# Patient Record
Sex: Female | Born: 1955
Health system: Southern US, Community
[De-identification: ages and names within clinical notes are randomized; demographics above are authoritative.]

## PROBLEM LIST (undated history)

## (undated) DIAGNOSIS — K219 Gastro-esophageal reflux disease without esophagitis: Secondary | ICD-10-CM

## (undated) DIAGNOSIS — G8929 Other chronic pain: Secondary | ICD-10-CM

## (undated) DIAGNOSIS — F329 Major depressive disorder, single episode, unspecified: Secondary | ICD-10-CM

## (undated) DIAGNOSIS — F419 Anxiety disorder, unspecified: Secondary | ICD-10-CM

## (undated) DIAGNOSIS — E782 Mixed hyperlipidemia: Secondary | ICD-10-CM

## (undated) DIAGNOSIS — G4709 Other insomnia: Secondary | ICD-10-CM

## (undated) DIAGNOSIS — R011 Cardiac murmur, unspecified: Secondary | ICD-10-CM

## (undated) DIAGNOSIS — F5101 Primary insomnia: Secondary | ICD-10-CM

## (undated) DIAGNOSIS — D509 Iron deficiency anemia, unspecified: Secondary | ICD-10-CM

## (undated) DIAGNOSIS — E039 Hypothyroidism, unspecified: Secondary | ICD-10-CM

## (undated) DIAGNOSIS — I509 Heart failure, unspecified: Secondary | ICD-10-CM

## (undated) DIAGNOSIS — T300 Burn of unspecified body region, unspecified degree: Secondary | ICD-10-CM

## (undated) DIAGNOSIS — F32A Depression, unspecified: Secondary | ICD-10-CM

## (undated) DIAGNOSIS — I5022 Chronic systolic (congestive) heart failure: Secondary | ICD-10-CM

## (undated) DIAGNOSIS — I10 Essential (primary) hypertension: Secondary | ICD-10-CM

## (undated) HISTORY — DX: Anxiety disorder, unspecified: F41.9

## (undated) HISTORY — DX: Other chronic pain: G89.29

## (undated) HISTORY — DX: Gastro-esophageal reflux disease without esophagitis: K21.9

## (undated) HISTORY — DX: Cardiac murmur, unspecified: R01.1

## (undated) HISTORY — PX: CARPAL TUNNEL RELEASE: SHX101

## (undated) HISTORY — PX: SKIN GRAFT: SHX250

## (undated) HISTORY — DX: Other insomnia: G47.09

## (undated) HISTORY — DX: Primary insomnia: F51.01

## (undated) HISTORY — DX: Depression, unspecified: F32.A

## (undated) HISTORY — PX: BACK SURGERY: SHX140

## (undated) HISTORY — DX: Iron deficiency anemia, unspecified: D50.9

## (undated) HISTORY — DX: Mixed hyperlipidemia: E78.2

## (undated) HISTORY — DX: Burn of unspecified body region, unspecified degree: T30.0

## (undated) HISTORY — DX: Chronic systolic (congestive) heart failure: I50.22

## (undated) HISTORY — DX: Essential (primary) hypertension: I10

---
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
## 1997-08-08 ENCOUNTER — Inpatient Hospital Stay (HOSPITAL_COMMUNITY): Admission: RE | Admit: 1997-08-08 | Discharge: 1997-08-08 | Payer: Self-pay | Admitting: Family Medicine

## 2001-09-01 ENCOUNTER — Encounter: Payer: Self-pay | Admitting: Physical Medicine and Rehabilitation

## 2001-09-01 ENCOUNTER — Encounter
Admission: RE | Admit: 2001-09-01 | Discharge: 2001-09-01 | Payer: Self-pay | Admitting: Physical Medicine and Rehabilitation

## 2001-11-17 ENCOUNTER — Emergency Department (HOSPITAL_COMMUNITY): Admission: EM | Admit: 2001-11-17 | Discharge: 2001-11-17 | Payer: Self-pay | Admitting: Emergency Medicine

## 2002-04-14 ENCOUNTER — Encounter: Payer: Self-pay | Admitting: Specialist

## 2002-04-18 ENCOUNTER — Encounter: Payer: Self-pay | Admitting: Specialist

## 2002-04-19 ENCOUNTER — Inpatient Hospital Stay (HOSPITAL_COMMUNITY): Admission: RE | Admit: 2002-04-19 | Discharge: 2002-04-20 | Payer: Self-pay | Admitting: Specialist

## 2003-03-07 ENCOUNTER — Encounter: Admission: RE | Admit: 2003-03-07 | Discharge: 2003-03-07 | Payer: Self-pay | Admitting: Specialist

## 2003-04-19 ENCOUNTER — Ambulatory Visit (HOSPITAL_COMMUNITY): Admission: RE | Admit: 2003-04-19 | Discharge: 2003-04-19 | Payer: Self-pay | Admitting: Obstetrics and Gynecology

## 2005-05-08 ENCOUNTER — Ambulatory Visit: Payer: Self-pay | Admitting: *Deleted

## 2005-05-15 ENCOUNTER — Ambulatory Visit: Payer: Self-pay

## 2005-06-25 ENCOUNTER — Ambulatory Visit: Payer: Self-pay

## 2008-01-14 HISTORY — PX: OTHER SURGICAL HISTORY: SHX169

## 2010-05-30 ENCOUNTER — Other Ambulatory Visit: Payer: Self-pay | Admitting: Obstetrics and Gynecology

## 2010-05-30 DIAGNOSIS — Z1231 Encounter for screening mammogram for malignant neoplasm of breast: Secondary | ICD-10-CM

## 2010-06-06 ENCOUNTER — Ambulatory Visit
Admission: RE | Admit: 2010-06-06 | Discharge: 2010-06-06 | Disposition: A | Discharge status: Home / Self Care | Payer: Medicare Other | Source: Ambulatory Visit | Attending: Obstetrics and Gynecology | Admitting: Obstetrics and Gynecology

## 2010-06-06 DIAGNOSIS — Z1231 Encounter for screening mammogram for malignant neoplasm of breast: Secondary | ICD-10-CM

## 2011-06-23 ENCOUNTER — Other Ambulatory Visit: Payer: Self-pay | Admitting: Obstetrics and Gynecology

## 2011-06-23 DIAGNOSIS — Z1231 Encounter for screening mammogram for malignant neoplasm of breast: Secondary | ICD-10-CM

## 2011-07-11 ENCOUNTER — Ambulatory Visit
Admission: RE | Admit: 2011-07-11 | Discharge: 2011-07-11 | Disposition: A | Discharge status: Home / Self Care | Payer: Medicare Other | Source: Ambulatory Visit | Attending: Obstetrics and Gynecology | Admitting: Obstetrics and Gynecology

## 2011-07-11 DIAGNOSIS — Z1231 Encounter for screening mammogram for malignant neoplasm of breast: Secondary | ICD-10-CM

## 2012-08-04 ENCOUNTER — Other Ambulatory Visit: Payer: Self-pay

## 2012-08-04 DIAGNOSIS — Z1231 Encounter for screening mammogram for malignant neoplasm of breast: Secondary | ICD-10-CM

## 2012-09-24 ENCOUNTER — Ambulatory Visit: Payer: Medicare Other

## 2012-11-05 ENCOUNTER — Ambulatory Visit
Admission: RE | Admit: 2012-11-05 | Discharge: 2012-11-05 | Disposition: A | Discharge status: Home / Self Care | Payer: Medicare Other | Source: Ambulatory Visit

## 2012-11-05 DIAGNOSIS — Z1231 Encounter for screening mammogram for malignant neoplasm of breast: Secondary | ICD-10-CM

## 2014-01-17 DIAGNOSIS — R5381 Other malaise: Secondary | ICD-10-CM | POA: Diagnosis not present

## 2014-02-17 DIAGNOSIS — R5381 Other malaise: Secondary | ICD-10-CM | POA: Diagnosis not present

## 2014-04-25 DIAGNOSIS — R9431 Abnormal electrocardiogram [ECG] [EKG]: Secondary | ICD-10-CM | POA: Diagnosis not present

## 2014-04-25 DIAGNOSIS — I10 Essential (primary) hypertension: Secondary | ICD-10-CM | POA: Diagnosis not present

## 2014-04-25 DIAGNOSIS — R011 Cardiac murmur, unspecified: Secondary | ICD-10-CM | POA: Diagnosis not present

## 2014-05-10 DIAGNOSIS — R011 Cardiac murmur, unspecified: Secondary | ICD-10-CM | POA: Diagnosis not present

## 2014-06-06 DIAGNOSIS — I10 Essential (primary) hypertension: Secondary | ICD-10-CM | POA: Diagnosis not present

## 2014-06-06 DIAGNOSIS — M545 Low back pain: Secondary | ICD-10-CM | POA: Diagnosis not present

## 2014-06-06 DIAGNOSIS — R9431 Abnormal electrocardiogram [ECG] [EKG]: Secondary | ICD-10-CM | POA: Diagnosis not present

## 2014-06-06 DIAGNOSIS — I1310 Hypertensive heart and chronic kidney disease without heart failure, with stage 1 through stage 4 chronic kidney disease, or unspecified chronic kidney disease: Secondary | ICD-10-CM | POA: Diagnosis not present

## 2014-06-06 DIAGNOSIS — M25562 Pain in left knee: Secondary | ICD-10-CM | POA: Diagnosis not present

## 2014-06-06 DIAGNOSIS — R7301 Impaired fasting glucose: Secondary | ICD-10-CM | POA: Diagnosis not present

## 2014-06-15 DIAGNOSIS — I10 Essential (primary) hypertension: Secondary | ICD-10-CM | POA: Diagnosis not present

## 2014-06-15 DIAGNOSIS — R7301 Impaired fasting glucose: Secondary | ICD-10-CM | POA: Diagnosis not present

## 2014-06-15 DIAGNOSIS — E780 Pure hypercholesterolemia: Secondary | ICD-10-CM | POA: Diagnosis not present

## 2014-06-21 DIAGNOSIS — R9431 Abnormal electrocardiogram [ECG] [EKG]: Secondary | ICD-10-CM | POA: Diagnosis not present

## 2014-10-03 DIAGNOSIS — I10 Essential (primary) hypertension: Secondary | ICD-10-CM | POA: Diagnosis not present

## 2014-10-03 DIAGNOSIS — E782 Mixed hyperlipidemia: Secondary | ICD-10-CM | POA: Diagnosis not present

## 2014-10-03 DIAGNOSIS — J0100 Acute maxillary sinusitis, unspecified: Secondary | ICD-10-CM | POA: Diagnosis not present

## 2014-10-03 DIAGNOSIS — R358 Other polyuria: Secondary | ICD-10-CM | POA: Diagnosis not present

## 2014-10-03 DIAGNOSIS — M545 Low back pain: Secondary | ICD-10-CM | POA: Diagnosis not present

## 2014-10-03 DIAGNOSIS — M5416 Radiculopathy, lumbar region: Secondary | ICD-10-CM | POA: Diagnosis not present

## 2014-10-03 DIAGNOSIS — Z23 Encounter for immunization: Secondary | ICD-10-CM | POA: Diagnosis not present

## 2014-10-03 DIAGNOSIS — R7301 Impaired fasting glucose: Secondary | ICD-10-CM | POA: Diagnosis not present

## 2014-10-25 DIAGNOSIS — E668 Other obesity: Secondary | ICD-10-CM | POA: Diagnosis not present

## 2014-10-25 DIAGNOSIS — E7800 Pure hypercholesterolemia, unspecified: Secondary | ICD-10-CM | POA: Diagnosis not present

## 2014-10-25 DIAGNOSIS — I10 Essential (primary) hypertension: Secondary | ICD-10-CM | POA: Diagnosis not present

## 2014-10-25 DIAGNOSIS — Z23 Encounter for immunization: Secondary | ICD-10-CM | POA: Diagnosis not present

## 2014-10-25 DIAGNOSIS — Z0000 Encounter for general adult medical examination without abnormal findings: Secondary | ICD-10-CM | POA: Diagnosis not present

## 2014-10-25 DIAGNOSIS — F320 Major depressive disorder, single episode, mild: Secondary | ICD-10-CM | POA: Diagnosis not present

## 2014-10-25 DIAGNOSIS — R7301 Impaired fasting glucose: Secondary | ICD-10-CM | POA: Diagnosis not present

## 2014-10-25 DIAGNOSIS — Z1211 Encounter for screening for malignant neoplasm of colon: Secondary | ICD-10-CM | POA: Diagnosis not present

## 2014-10-25 DIAGNOSIS — Z6841 Body Mass Index (BMI) 40.0 and over, adult: Secondary | ICD-10-CM | POA: Diagnosis not present

## 2014-11-17 DIAGNOSIS — Z1211 Encounter for screening for malignant neoplasm of colon: Secondary | ICD-10-CM | POA: Diagnosis not present

## 2014-11-17 DIAGNOSIS — F320 Major depressive disorder, single episode, mild: Secondary | ICD-10-CM | POA: Diagnosis not present

## 2014-11-17 DIAGNOSIS — I10 Essential (primary) hypertension: Secondary | ICD-10-CM | POA: Diagnosis not present

## 2014-11-17 DIAGNOSIS — E782 Mixed hyperlipidemia: Secondary | ICD-10-CM | POA: Diagnosis not present

## 2014-11-17 DIAGNOSIS — M545 Low back pain: Secondary | ICD-10-CM | POA: Diagnosis not present

## 2015-01-19 DIAGNOSIS — R7301 Impaired fasting glucose: Secondary | ICD-10-CM | POA: Diagnosis not present

## 2015-01-19 DIAGNOSIS — I10 Essential (primary) hypertension: Secondary | ICD-10-CM | POA: Diagnosis not present

## 2015-01-19 DIAGNOSIS — E782 Mixed hyperlipidemia: Secondary | ICD-10-CM | POA: Diagnosis not present

## 2015-01-23 DIAGNOSIS — G5602 Carpal tunnel syndrome, left upper limb: Secondary | ICD-10-CM | POA: Diagnosis not present

## 2015-01-23 DIAGNOSIS — M545 Low back pain: Secondary | ICD-10-CM | POA: Diagnosis not present

## 2015-01-23 DIAGNOSIS — M5416 Radiculopathy, lumbar region: Secondary | ICD-10-CM | POA: Diagnosis not present

## 2015-01-23 DIAGNOSIS — E782 Mixed hyperlipidemia: Secondary | ICD-10-CM | POA: Diagnosis not present

## 2015-01-23 DIAGNOSIS — Z6841 Body Mass Index (BMI) 40.0 and over, adult: Secondary | ICD-10-CM | POA: Diagnosis not present

## 2015-01-23 DIAGNOSIS — R7301 Impaired fasting glucose: Secondary | ICD-10-CM | POA: Diagnosis not present

## 2015-01-23 DIAGNOSIS — I10 Essential (primary) hypertension: Secondary | ICD-10-CM | POA: Diagnosis not present

## 2015-02-23 DIAGNOSIS — I10 Essential (primary) hypertension: Secondary | ICD-10-CM | POA: Diagnosis not present

## 2015-02-23 DIAGNOSIS — F320 Major depressive disorder, single episode, mild: Secondary | ICD-10-CM | POA: Diagnosis not present

## 2015-02-23 DIAGNOSIS — Z6841 Body Mass Index (BMI) 40.0 and over, adult: Secondary | ICD-10-CM | POA: Diagnosis not present

## 2015-02-23 DIAGNOSIS — R7301 Impaired fasting glucose: Secondary | ICD-10-CM | POA: Diagnosis not present

## 2015-03-13 DIAGNOSIS — K219 Gastro-esophageal reflux disease without esophagitis: Secondary | ICD-10-CM | POA: Diagnosis not present

## 2015-03-13 DIAGNOSIS — D509 Iron deficiency anemia, unspecified: Secondary | ICD-10-CM | POA: Diagnosis not present

## 2015-03-16 DIAGNOSIS — F5102 Adjustment insomnia: Secondary | ICD-10-CM | POA: Diagnosis not present

## 2015-03-16 DIAGNOSIS — F320 Major depressive disorder, single episode, mild: Secondary | ICD-10-CM | POA: Diagnosis not present

## 2015-03-16 DIAGNOSIS — M545 Low back pain: Secondary | ICD-10-CM | POA: Diagnosis not present

## 2015-03-16 DIAGNOSIS — R7301 Impaired fasting glucose: Secondary | ICD-10-CM | POA: Diagnosis not present

## 2015-03-16 DIAGNOSIS — D508 Other iron deficiency anemias: Secondary | ICD-10-CM | POA: Diagnosis not present

## 2015-03-16 DIAGNOSIS — Z6841 Body Mass Index (BMI) 40.0 and over, adult: Secondary | ICD-10-CM | POA: Diagnosis not present

## 2015-03-30 DIAGNOSIS — D509 Iron deficiency anemia, unspecified: Secondary | ICD-10-CM | POA: Diagnosis not present

## 2015-03-30 DIAGNOSIS — K641 Second degree hemorrhoids: Secondary | ICD-10-CM | POA: Diagnosis not present

## 2015-03-30 DIAGNOSIS — K219 Gastro-esophageal reflux disease without esophagitis: Secondary | ICD-10-CM | POA: Diagnosis not present

## 2015-03-30 DIAGNOSIS — D649 Anemia, unspecified: Secondary | ICD-10-CM | POA: Diagnosis not present

## 2015-03-30 DIAGNOSIS — K5730 Diverticulosis of large intestine without perforation or abscess without bleeding: Secondary | ICD-10-CM | POA: Diagnosis not present

## 2015-03-30 DIAGNOSIS — Z1211 Encounter for screening for malignant neoplasm of colon: Secondary | ICD-10-CM | POA: Diagnosis not present

## 2015-03-30 DIAGNOSIS — K2970 Gastritis, unspecified, without bleeding: Secondary | ICD-10-CM | POA: Diagnosis not present

## 2015-03-30 DIAGNOSIS — K449 Diaphragmatic hernia without obstruction or gangrene: Secondary | ICD-10-CM | POA: Diagnosis not present

## 2015-03-30 DIAGNOSIS — K2900 Acute gastritis without bleeding: Secondary | ICD-10-CM | POA: Diagnosis not present

## 2015-03-30 HISTORY — PX: COLONOSCOPY: SHX174

## 2015-03-30 HISTORY — PX: ESOPHAGOGASTRODUODENOSCOPY: SHX1529

## 2015-04-20 DIAGNOSIS — M961 Postlaminectomy syndrome, not elsewhere classified: Secondary | ICD-10-CM | POA: Diagnosis not present

## 2015-04-20 DIAGNOSIS — M545 Low back pain: Secondary | ICD-10-CM | POA: Diagnosis not present

## 2015-04-20 DIAGNOSIS — M5431 Sciatica, right side: Secondary | ICD-10-CM | POA: Diagnosis not present

## 2015-04-20 DIAGNOSIS — M5106 Intervertebral disc disorders with myelopathy, lumbar region: Secondary | ICD-10-CM | POA: Diagnosis not present

## 2015-05-04 DIAGNOSIS — Z6841 Body Mass Index (BMI) 40.0 and over, adult: Secondary | ICD-10-CM | POA: Diagnosis not present

## 2015-05-04 DIAGNOSIS — E782 Mixed hyperlipidemia: Secondary | ICD-10-CM | POA: Diagnosis not present

## 2015-05-04 DIAGNOSIS — I10 Essential (primary) hypertension: Secondary | ICD-10-CM | POA: Diagnosis not present

## 2015-05-04 DIAGNOSIS — R7301 Impaired fasting glucose: Secondary | ICD-10-CM | POA: Diagnosis not present

## 2015-05-04 DIAGNOSIS — M5431 Sciatica, right side: Secondary | ICD-10-CM | POA: Diagnosis not present

## 2015-05-04 DIAGNOSIS — G5602 Carpal tunnel syndrome, left upper limb: Secondary | ICD-10-CM | POA: Diagnosis not present

## 2015-05-04 DIAGNOSIS — M5416 Radiculopathy, lumbar region: Secondary | ICD-10-CM | POA: Diagnosis not present

## 2015-05-04 DIAGNOSIS — M545 Low back pain: Secondary | ICD-10-CM | POA: Diagnosis not present

## 2015-08-08 DIAGNOSIS — M545 Low back pain: Secondary | ICD-10-CM | POA: Diagnosis not present

## 2015-08-08 DIAGNOSIS — E782 Mixed hyperlipidemia: Secondary | ICD-10-CM | POA: Diagnosis not present

## 2015-08-08 DIAGNOSIS — F321 Major depressive disorder, single episode, moderate: Secondary | ICD-10-CM | POA: Diagnosis not present

## 2015-08-08 DIAGNOSIS — I10 Essential (primary) hypertension: Secondary | ICD-10-CM | POA: Diagnosis not present

## 2015-08-08 DIAGNOSIS — R7301 Impaired fasting glucose: Secondary | ICD-10-CM | POA: Diagnosis not present

## 2015-08-08 DIAGNOSIS — F5102 Adjustment insomnia: Secondary | ICD-10-CM | POA: Diagnosis not present

## 2015-08-08 DIAGNOSIS — Z6841 Body Mass Index (BMI) 40.0 and over, adult: Secondary | ICD-10-CM | POA: Diagnosis not present

## 2015-08-08 DIAGNOSIS — M5416 Radiculopathy, lumbar region: Secondary | ICD-10-CM | POA: Diagnosis not present

## 2015-10-19 DIAGNOSIS — Z01419 Encounter for gynecological examination (general) (routine) without abnormal findings: Secondary | ICD-10-CM | POA: Diagnosis not present

## 2015-10-19 DIAGNOSIS — Z1231 Encounter for screening mammogram for malignant neoplasm of breast: Secondary | ICD-10-CM | POA: Diagnosis not present

## 2015-10-22 ENCOUNTER — Other Ambulatory Visit: Payer: Self-pay | Admitting: Obstetrics and Gynecology

## 2015-10-22 DIAGNOSIS — R928 Other abnormal and inconclusive findings on diagnostic imaging of breast: Secondary | ICD-10-CM

## 2015-10-31 ENCOUNTER — Other Ambulatory Visit: Payer: Medicare Other

## 2015-11-14 DIAGNOSIS — F5102 Adjustment insomnia: Secondary | ICD-10-CM | POA: Diagnosis not present

## 2015-11-14 DIAGNOSIS — F321 Major depressive disorder, single episode, moderate: Secondary | ICD-10-CM | POA: Diagnosis not present

## 2015-11-14 DIAGNOSIS — M5416 Radiculopathy, lumbar region: Secondary | ICD-10-CM | POA: Diagnosis not present

## 2015-11-14 DIAGNOSIS — I10 Essential (primary) hypertension: Secondary | ICD-10-CM | POA: Diagnosis not present

## 2015-11-14 DIAGNOSIS — R7301 Impaired fasting glucose: Secondary | ICD-10-CM | POA: Diagnosis not present

## 2015-11-14 DIAGNOSIS — Z6841 Body Mass Index (BMI) 40.0 and over, adult: Secondary | ICD-10-CM | POA: Diagnosis not present

## 2015-11-14 DIAGNOSIS — M545 Low back pain: Secondary | ICD-10-CM | POA: Diagnosis not present

## 2015-11-14 DIAGNOSIS — E782 Mixed hyperlipidemia: Secondary | ICD-10-CM | POA: Diagnosis not present

## 2015-11-26 ENCOUNTER — Ambulatory Visit
Admission: RE | Admit: 2015-11-26 | Discharge: 2015-11-26 | Disposition: A | Discharge status: Home / Self Care | Payer: Commercial Managed Care - HMO | Source: Ambulatory Visit | Attending: Obstetrics and Gynecology | Admitting: Obstetrics and Gynecology

## 2015-11-26 DIAGNOSIS — R928 Other abnormal and inconclusive findings on diagnostic imaging of breast: Secondary | ICD-10-CM

## 2015-11-28 ENCOUNTER — Ambulatory Visit
Admission: RE | Admit: 2015-11-28 | Discharge: 2015-11-28 | Disposition: A | Discharge status: Home / Self Care | Payer: Commercial Managed Care - HMO | Source: Ambulatory Visit | Attending: Obstetrics and Gynecology | Admitting: Obstetrics and Gynecology

## 2015-11-28 DIAGNOSIS — R928 Other abnormal and inconclusive findings on diagnostic imaging of breast: Secondary | ICD-10-CM | POA: Diagnosis not present

## 2015-11-28 DIAGNOSIS — N6489 Other specified disorders of breast: Secondary | ICD-10-CM | POA: Diagnosis not present

## 2016-03-13 DIAGNOSIS — R7301 Impaired fasting glucose: Secondary | ICD-10-CM | POA: Diagnosis not present

## 2016-03-13 DIAGNOSIS — Z6841 Body Mass Index (BMI) 40.0 and over, adult: Secondary | ICD-10-CM | POA: Diagnosis not present

## 2016-03-13 DIAGNOSIS — E782 Mixed hyperlipidemia: Secondary | ICD-10-CM | POA: Diagnosis not present

## 2016-03-13 DIAGNOSIS — M5416 Radiculopathy, lumbar region: Secondary | ICD-10-CM | POA: Diagnosis not present

## 2016-03-13 DIAGNOSIS — I10 Essential (primary) hypertension: Secondary | ICD-10-CM | POA: Diagnosis not present

## 2016-03-13 DIAGNOSIS — R590 Localized enlarged lymph nodes: Secondary | ICD-10-CM | POA: Diagnosis not present

## 2016-03-13 DIAGNOSIS — G5602 Carpal tunnel syndrome, left upper limb: Secondary | ICD-10-CM | POA: Diagnosis not present

## 2016-03-13 DIAGNOSIS — M545 Low back pain: Secondary | ICD-10-CM | POA: Diagnosis not present

## 2016-03-20 DIAGNOSIS — R590 Localized enlarged lymph nodes: Secondary | ICD-10-CM | POA: Diagnosis not present

## 2016-03-20 DIAGNOSIS — R609 Edema, unspecified: Secondary | ICD-10-CM | POA: Diagnosis not present

## 2016-05-05 DIAGNOSIS — J018 Other acute sinusitis: Secondary | ICD-10-CM | POA: Diagnosis not present

## 2016-05-08 DIAGNOSIS — M67432 Ganglion, left wrist: Secondary | ICD-10-CM | POA: Diagnosis not present

## 2016-05-08 DIAGNOSIS — M79642 Pain in left hand: Secondary | ICD-10-CM | POA: Diagnosis not present

## 2016-05-08 DIAGNOSIS — G5602 Carpal tunnel syndrome, left upper limb: Secondary | ICD-10-CM | POA: Diagnosis not present

## 2016-05-20 DIAGNOSIS — D492 Neoplasm of unspecified behavior of bone, soft tissue, and skin: Secondary | ICD-10-CM | POA: Diagnosis not present

## 2016-05-20 DIAGNOSIS — M67432 Ganglion, left wrist: Secondary | ICD-10-CM | POA: Diagnosis not present

## 2016-05-20 DIAGNOSIS — G5602 Carpal tunnel syndrome, left upper limb: Secondary | ICD-10-CM | POA: Diagnosis not present

## 2016-05-20 DIAGNOSIS — G8918 Other acute postprocedural pain: Secondary | ICD-10-CM | POA: Diagnosis not present

## 2016-05-20 DIAGNOSIS — M67832 Other specified disorders of synovium, left wrist: Secondary | ICD-10-CM | POA: Diagnosis not present

## 2016-05-20 DIAGNOSIS — M79632 Pain in left forearm: Secondary | ICD-10-CM | POA: Diagnosis not present

## 2016-06-02 DIAGNOSIS — M67432 Ganglion, left wrist: Secondary | ICD-10-CM | POA: Diagnosis not present

## 2016-06-02 DIAGNOSIS — G5602 Carpal tunnel syndrome, left upper limb: Secondary | ICD-10-CM | POA: Diagnosis not present

## 2016-06-12 DIAGNOSIS — Z6841 Body Mass Index (BMI) 40.0 and over, adult: Secondary | ICD-10-CM | POA: Diagnosis not present

## 2016-06-12 DIAGNOSIS — R7301 Impaired fasting glucose: Secondary | ICD-10-CM | POA: Diagnosis not present

## 2016-06-12 DIAGNOSIS — I10 Essential (primary) hypertension: Secondary | ICD-10-CM | POA: Diagnosis not present

## 2016-06-12 DIAGNOSIS — E782 Mixed hyperlipidemia: Secondary | ICD-10-CM | POA: Diagnosis not present

## 2016-06-12 DIAGNOSIS — F320 Major depressive disorder, single episode, mild: Secondary | ICD-10-CM | POA: Diagnosis not present

## 2016-06-12 DIAGNOSIS — E663 Overweight: Secondary | ICD-10-CM | POA: Diagnosis not present

## 2016-06-12 DIAGNOSIS — J302 Other seasonal allergic rhinitis: Secondary | ICD-10-CM | POA: Diagnosis not present

## 2016-06-16 DIAGNOSIS — G5602 Carpal tunnel syndrome, left upper limb: Secondary | ICD-10-CM | POA: Diagnosis not present

## 2016-06-18 DIAGNOSIS — R7301 Impaired fasting glucose: Secondary | ICD-10-CM | POA: Diagnosis not present

## 2016-06-18 DIAGNOSIS — E782 Mixed hyperlipidemia: Secondary | ICD-10-CM | POA: Diagnosis not present

## 2016-06-18 DIAGNOSIS — I10 Essential (primary) hypertension: Secondary | ICD-10-CM | POA: Diagnosis not present

## 2016-06-30 DIAGNOSIS — G5602 Carpal tunnel syndrome, left upper limb: Secondary | ICD-10-CM | POA: Diagnosis not present

## 2016-07-10 DIAGNOSIS — M6281 Muscle weakness (generalized): Secondary | ICD-10-CM | POA: Diagnosis not present

## 2016-07-10 DIAGNOSIS — M25532 Pain in left wrist: Secondary | ICD-10-CM | POA: Diagnosis not present

## 2016-07-10 DIAGNOSIS — M25642 Stiffness of left hand, not elsewhere classified: Secondary | ICD-10-CM | POA: Diagnosis not present

## 2016-07-10 DIAGNOSIS — M25442 Effusion, left hand: Secondary | ICD-10-CM | POA: Diagnosis not present

## 2016-07-17 DIAGNOSIS — M6281 Muscle weakness (generalized): Secondary | ICD-10-CM | POA: Diagnosis not present

## 2016-07-17 DIAGNOSIS — M25442 Effusion, left hand: Secondary | ICD-10-CM | POA: Diagnosis not present

## 2016-07-17 DIAGNOSIS — M25642 Stiffness of left hand, not elsewhere classified: Secondary | ICD-10-CM | POA: Diagnosis not present

## 2016-07-17 DIAGNOSIS — M25532 Pain in left wrist: Secondary | ICD-10-CM | POA: Diagnosis not present

## 2016-08-19 ENCOUNTER — Other Ambulatory Visit: Payer: Self-pay | Admitting: Pharmacy Technician

## 2016-08-19 NOTE — Patient Outreach (Signed)
[facility]) Care Management  08/19/2016  DAKISHA SCHOOF 01-18-1955 [id number]   Contacted patient in regards to Metformin medication adherence. No answer, left [employer] appropriate voicemail to return my call.  Maud Deed [place], [place] Management [phone]

## 2016-08-27 ENCOUNTER — Other Ambulatory Visit: Payer: Self-pay | Admitting: Pharmacy Technician

## 2016-08-27 NOTE — Patient Outreach (Signed)
[facility]) Care Management  08/27/2016  Donna Mayer 30-Apr-1955 [id number]   Contacted patient in regards to Metformin medication adherence. Patient states she is currently taking 1 tablet daily without issues. Patient receives other meds thru [employer]. I offered to contact Braswell Cox's office to have a 3 month script sent into [facility] mail order. Patient consented that I do so.   Contacted Kristen Cox's office and left voicemail on nurse [employer] requesting script be sent into [facility] mail order pharmacy and to please call me back to confirm.  Maud Deed [place], [place] Management [phone]

## 2016-09-17 DIAGNOSIS — Z6841 Body Mass Index (BMI) 40.0 and over, adult: Secondary | ICD-10-CM | POA: Diagnosis not present

## 2016-09-17 DIAGNOSIS — E663 Overweight: Secondary | ICD-10-CM | POA: Diagnosis not present

## 2016-09-17 DIAGNOSIS — E782 Mixed hyperlipidemia: Secondary | ICD-10-CM | POA: Diagnosis not present

## 2016-09-17 DIAGNOSIS — D6489 Other specified anemias: Secondary | ICD-10-CM | POA: Diagnosis not present

## 2016-09-17 DIAGNOSIS — F320 Major depressive disorder, single episode, mild: Secondary | ICD-10-CM | POA: Diagnosis not present

## 2016-09-17 DIAGNOSIS — J302 Other seasonal allergic rhinitis: Secondary | ICD-10-CM | POA: Diagnosis not present

## 2016-09-17 DIAGNOSIS — I10 Essential (primary) hypertension: Secondary | ICD-10-CM | POA: Diagnosis not present

## 2016-09-17 DIAGNOSIS — R05 Cough: Secondary | ICD-10-CM | POA: Diagnosis not present

## 2016-09-17 DIAGNOSIS — D508 Other iron deficiency anemias: Secondary | ICD-10-CM | POA: Diagnosis not present

## 2016-09-17 DIAGNOSIS — R7301 Impaired fasting glucose: Secondary | ICD-10-CM | POA: Diagnosis not present

## 2016-10-17 DIAGNOSIS — J302 Other seasonal allergic rhinitis: Secondary | ICD-10-CM | POA: Diagnosis not present

## 2016-10-17 DIAGNOSIS — Z23 Encounter for immunization: Secondary | ICD-10-CM | POA: Diagnosis not present

## 2016-10-17 DIAGNOSIS — E663 Overweight: Secondary | ICD-10-CM | POA: Diagnosis not present

## 2016-10-17 DIAGNOSIS — D508 Other iron deficiency anemias: Secondary | ICD-10-CM | POA: Diagnosis not present

## 2016-12-25 DIAGNOSIS — I10 Essential (primary) hypertension: Secondary | ICD-10-CM | POA: Diagnosis not present

## 2016-12-25 DIAGNOSIS — R7301 Impaired fasting glucose: Secondary | ICD-10-CM | POA: Diagnosis not present

## 2016-12-25 DIAGNOSIS — Z6841 Body Mass Index (BMI) 40.0 and over, adult: Secondary | ICD-10-CM | POA: Diagnosis not present

## 2016-12-25 DIAGNOSIS — F320 Major depressive disorder, single episode, mild: Secondary | ICD-10-CM | POA: Diagnosis not present

## 2016-12-25 DIAGNOSIS — E782 Mixed hyperlipidemia: Secondary | ICD-10-CM | POA: Diagnosis not present

## 2016-12-25 DIAGNOSIS — E663 Overweight: Secondary | ICD-10-CM | POA: Diagnosis not present

## 2017-02-04 DIAGNOSIS — R358 Other polyuria: Secondary | ICD-10-CM | POA: Diagnosis not present

## 2017-02-04 DIAGNOSIS — J018 Other acute sinusitis: Secondary | ICD-10-CM | POA: Diagnosis not present

## 2017-02-04 DIAGNOSIS — H65191 Other acute nonsuppurative otitis media, right ear: Secondary | ICD-10-CM | POA: Diagnosis not present

## 2017-02-04 DIAGNOSIS — Z6841 Body Mass Index (BMI) 40.0 and over, adult: Secondary | ICD-10-CM | POA: Diagnosis not present

## 2017-03-31 DIAGNOSIS — E782 Mixed hyperlipidemia: Secondary | ICD-10-CM | POA: Diagnosis not present

## 2017-03-31 DIAGNOSIS — F321 Major depressive disorder, single episode, moderate: Secondary | ICD-10-CM | POA: Diagnosis not present

## 2017-03-31 DIAGNOSIS — Z6841 Body Mass Index (BMI) 40.0 and over, adult: Secondary | ICD-10-CM | POA: Diagnosis not present

## 2017-03-31 DIAGNOSIS — E663 Overweight: Secondary | ICD-10-CM | POA: Diagnosis not present

## 2017-03-31 DIAGNOSIS — I10 Essential (primary) hypertension: Secondary | ICD-10-CM | POA: Diagnosis not present

## 2017-03-31 DIAGNOSIS — R7301 Impaired fasting glucose: Secondary | ICD-10-CM | POA: Diagnosis not present

## 2017-04-13 DIAGNOSIS — H10219 Acute toxic conjunctivitis, unspecified eye: Secondary | ICD-10-CM | POA: Diagnosis not present

## 2017-04-13 DIAGNOSIS — H10213 Acute toxic conjunctivitis, bilateral: Secondary | ICD-10-CM | POA: Diagnosis not present

## 2017-05-11 DIAGNOSIS — I10 Essential (primary) hypertension: Secondary | ICD-10-CM | POA: Diagnosis not present

## 2017-05-11 DIAGNOSIS — Z6841 Body Mass Index (BMI) 40.0 and over, adult: Secondary | ICD-10-CM | POA: Diagnosis not present

## 2017-05-11 DIAGNOSIS — F321 Major depressive disorder, single episode, moderate: Secondary | ICD-10-CM | POA: Diagnosis not present

## 2017-05-11 DIAGNOSIS — E663 Overweight: Secondary | ICD-10-CM | POA: Diagnosis not present

## 2017-08-31 DIAGNOSIS — F321 Major depressive disorder, single episode, moderate: Secondary | ICD-10-CM | POA: Diagnosis not present

## 2017-08-31 DIAGNOSIS — I10 Essential (primary) hypertension: Secondary | ICD-10-CM | POA: Diagnosis not present

## 2017-08-31 DIAGNOSIS — K219 Gastro-esophageal reflux disease without esophagitis: Secondary | ICD-10-CM | POA: Diagnosis not present

## 2017-08-31 DIAGNOSIS — Z6841 Body Mass Index (BMI) 40.0 and over, adult: Secondary | ICD-10-CM | POA: Diagnosis not present

## 2017-08-31 DIAGNOSIS — M545 Low back pain: Secondary | ICD-10-CM | POA: Diagnosis not present

## 2017-08-31 DIAGNOSIS — R7301 Impaired fasting glucose: Secondary | ICD-10-CM | POA: Diagnosis not present

## 2017-08-31 DIAGNOSIS — E782 Mixed hyperlipidemia: Secondary | ICD-10-CM | POA: Diagnosis not present

## 2017-08-31 DIAGNOSIS — J018 Other acute sinusitis: Secondary | ICD-10-CM | POA: Diagnosis not present

## 2017-10-05 DIAGNOSIS — Z23 Encounter for immunization: Secondary | ICD-10-CM | POA: Diagnosis not present

## 2017-10-05 DIAGNOSIS — I10 Essential (primary) hypertension: Secondary | ICD-10-CM | POA: Diagnosis not present

## 2017-10-05 DIAGNOSIS — Z6841 Body Mass Index (BMI) 40.0 and over, adult: Secondary | ICD-10-CM | POA: Diagnosis not present

## 2017-12-03 DIAGNOSIS — M545 Low back pain: Secondary | ICD-10-CM | POA: Diagnosis not present

## 2017-12-03 DIAGNOSIS — Z6841 Body Mass Index (BMI) 40.0 and over, adult: Secondary | ICD-10-CM | POA: Diagnosis not present

## 2017-12-03 DIAGNOSIS — F321 Major depressive disorder, single episode, moderate: Secondary | ICD-10-CM | POA: Diagnosis not present

## 2017-12-03 DIAGNOSIS — D508 Other iron deficiency anemias: Secondary | ICD-10-CM | POA: Diagnosis not present

## 2017-12-03 DIAGNOSIS — I10 Essential (primary) hypertension: Secondary | ICD-10-CM | POA: Diagnosis not present

## 2017-12-03 DIAGNOSIS — E782 Mixed hyperlipidemia: Secondary | ICD-10-CM | POA: Diagnosis not present

## 2017-12-03 DIAGNOSIS — R7301 Impaired fasting glucose: Secondary | ICD-10-CM | POA: Diagnosis not present

## 2017-12-03 DIAGNOSIS — K219 Gastro-esophageal reflux disease without esophagitis: Secondary | ICD-10-CM | POA: Diagnosis not present

## 2017-12-03 DIAGNOSIS — Z0001 Encounter for general adult medical examination with abnormal findings: Secondary | ICD-10-CM | POA: Diagnosis not present

## 2018-02-15 DIAGNOSIS — M25561 Pain in right knee: Secondary | ICD-10-CM | POA: Diagnosis not present

## 2018-03-02 DIAGNOSIS — J00 Acute nasopharyngitis [common cold]: Secondary | ICD-10-CM | POA: Diagnosis not present

## 2018-03-15 DIAGNOSIS — Z6841 Body Mass Index (BMI) 40.0 and over, adult: Secondary | ICD-10-CM | POA: Diagnosis not present

## 2018-03-15 DIAGNOSIS — E782 Mixed hyperlipidemia: Secondary | ICD-10-CM | POA: Diagnosis not present

## 2018-03-15 DIAGNOSIS — K219 Gastro-esophageal reflux disease without esophagitis: Secondary | ICD-10-CM | POA: Diagnosis not present

## 2018-03-15 DIAGNOSIS — I10 Essential (primary) hypertension: Secondary | ICD-10-CM | POA: Diagnosis not present

## 2018-03-15 DIAGNOSIS — D508 Other iron deficiency anemias: Secondary | ICD-10-CM | POA: Diagnosis not present

## 2018-06-16 DIAGNOSIS — K219 Gastro-esophageal reflux disease without esophagitis: Secondary | ICD-10-CM | POA: Diagnosis not present

## 2018-06-16 DIAGNOSIS — R7301 Impaired fasting glucose: Secondary | ICD-10-CM | POA: Diagnosis not present

## 2018-06-16 DIAGNOSIS — Z6841 Body Mass Index (BMI) 40.0 and over, adult: Secondary | ICD-10-CM | POA: Diagnosis not present

## 2018-06-16 DIAGNOSIS — I10 Essential (primary) hypertension: Secondary | ICD-10-CM | POA: Diagnosis not present

## 2018-06-16 DIAGNOSIS — E782 Mixed hyperlipidemia: Secondary | ICD-10-CM | POA: Diagnosis not present

## 2018-06-16 DIAGNOSIS — F320 Major depressive disorder, single episode, mild: Secondary | ICD-10-CM | POA: Diagnosis not present

## 2018-06-16 DIAGNOSIS — D508 Other iron deficiency anemias: Secondary | ICD-10-CM | POA: Diagnosis not present

## 2018-10-20 DIAGNOSIS — E782 Mixed hyperlipidemia: Secondary | ICD-10-CM | POA: Diagnosis not present

## 2018-10-20 DIAGNOSIS — F320 Major depressive disorder, single episode, mild: Secondary | ICD-10-CM | POA: Diagnosis not present

## 2018-10-20 DIAGNOSIS — G4709 Other insomnia: Secondary | ICD-10-CM | POA: Diagnosis not present

## 2018-10-20 DIAGNOSIS — Z6841 Body Mass Index (BMI) 40.0 and over, adult: Secondary | ICD-10-CM | POA: Diagnosis not present

## 2018-10-20 DIAGNOSIS — K219 Gastro-esophageal reflux disease without esophagitis: Secondary | ICD-10-CM | POA: Diagnosis not present

## 2018-10-20 DIAGNOSIS — I10 Essential (primary) hypertension: Secondary | ICD-10-CM | POA: Diagnosis not present

## 2018-10-20 DIAGNOSIS — M545 Low back pain: Secondary | ICD-10-CM | POA: Diagnosis not present

## 2018-10-20 DIAGNOSIS — D508 Other iron deficiency anemias: Secondary | ICD-10-CM | POA: Diagnosis not present

## 2018-10-27 DIAGNOSIS — K219 Gastro-esophageal reflux disease without esophagitis: Secondary | ICD-10-CM | POA: Diagnosis not present

## 2018-10-27 DIAGNOSIS — R05 Cough: Secondary | ICD-10-CM | POA: Diagnosis not present

## 2018-10-28 DIAGNOSIS — R05 Cough: Secondary | ICD-10-CM | POA: Diagnosis not present

## 2018-11-03 ENCOUNTER — Encounter: Payer: Self-pay | Admitting: Gastroenterology

## 2018-11-11 ENCOUNTER — Other Ambulatory Visit: Payer: Self-pay

## 2018-11-11 ENCOUNTER — Ambulatory Visit: Payer: Medicare HMO | Admitting: Gastroenterology

## 2018-11-11 ENCOUNTER — Telehealth: Payer: Self-pay | Admitting: Gastroenterology

## 2018-11-11 ENCOUNTER — Encounter: Payer: Self-pay | Admitting: Gastroenterology

## 2018-11-11 VITALS — BP 146/90 | HR 114 | Temp 97.6°F | Ht 62.0 in | Wt 217.5 lb

## 2018-11-11 DIAGNOSIS — R1110 Vomiting, unspecified: Secondary | ICD-10-CM

## 2018-11-11 DIAGNOSIS — K219 Gastro-esophageal reflux disease without esophagitis: Secondary | ICD-10-CM

## 2018-11-11 MED ORDER — ESOMEPRAZOLE MAGNESIUM 20 MG PO CPDR: 20.0000 mg | DELAYED_RELEASE_CAPSULE | Freq: Two times a day (BID) | ORAL | 11 refills | Status: DC

## 2018-11-11 NOTE — Patient Instructions (Signed)
If you are age 61 or older, your body mass index should be between 23-30. Your Body mass index is 39.78 kg/m. If this is out of the aforementioned range listed, please consider follow up with your Primary Care Provider.  If you are age 7 or younger, your body mass index should be between 19-25. Your Body mass index is 39.78 kg/m. If this is out of the aformentioned range listed, please consider follow up with your Primary Care Provider.   We have sent the following medications to your pharmacy for you to pick up at your convenience: Nexium  You have been scheduled for an Upper GI Series and Small Bowel Follow Thru at [facility]. Your appointment is on 11/19/18 at 9:30am. Please arrive 15 minutes prior to your test for registration. Make certain not to have anything to eat or drink after midnight on the night before your test. If you need to reschedule, please contact radiology at [phone]. --------------------------------------------------------------------------------------------------------------- An upper GI series uses x rays to help diagnose problems of the upper GI tract, which includes the esophagus, stomach, and duodenum. The duodenum is the first part of the small intestine. An upper GI series is conducted by a radiology technologist or a radiologist-a doctor who specializes in x-ray imaging-at a hospital or outpatient center. While sitting or standing in front of an x-ray machine, the patient drinks barium liquid, which is often white and has a chalky consistency and taste. The barium liquid coats the lining of the upper GI tract and makes signs of disease show up more clearly on x rays. X-ray video, called fluoroscopy, is used to view the barium liquid moving through the esophagus, stomach, and duodenum. Additional x rays and fluoroscopy are performed while the patient lies on an x-ray table. To fully coat the upper GI tract with barium liquid, the technologist or radiologist may press  on the abdomen or ask the patient to change position. Patients hold still in various positions, allowing the technologist or radiologist to take x rays of the upper GI tract at different angles. If a technologist conducts the upper GI series, a radiologist will later examine the images to look for problems.  This test typically takes about 1 hour to complete --------------------------------------------------------------------------------------------------------------------------------------------- The Small Bowel Follow Thru examination is used to visualize the entire small bowel (intestines); specifically the connection between the small and large intestine. You will be positioned on a flat x-ray table and an image of your abdomen taken. Then the technologist will show the x-ray to the radiologist. The radiologist will instruct your technologist how much (1-2 cups) barium sulfate you will drink and when to begin taking the timed x-rays, usually 15-30 minutes after you begin drinking. Barium is a harmless substance that will highlight your small intestine by absorbing x-ray. The taste is chalky and it feels very heavy both in the cup and in your stomach.  After the first x-ray is taken and shown to the radiologist, he/she will determine when the next image is to be taken. This is repeated until the barium has reached the end of the small intestine and enters the beginning of the colon (cecum). At such time when the barium spills into the colon, you will be positioned on the x-ray table once again. The radiologist will use a fluoroscopic camera to take some detailed pictures of the connection between your small intestine and colon. The fluoroscope is an x-ray unit that works with a television/computer screen. The radiologist will apply pressure to your  abdomen with his/her hand and a lead glove, a plastic paddle, or a paddle with an inflated rubber balloon on the end. This is to spread apart your loops of  intestine so he/she can see all areas.   This test typically takes around 1 hour to complete.  Important  Drink plenty of water (8-10 cups/day) for a few days following the procedure to avoid constipation and blockage. The barium will make your stools white for a few days. --------------------------------------------------------------------------------------------------------------------------------------------  Please call Dr. Leland Her nurse Karl Pock, RN)  in 2 weeks at [phone]  to let her now how you are doing.   Follow up in 6-8 weeks.   Thank you,  Dr. Jackquline Denmark

## 2018-11-11 NOTE — Telephone Encounter (Signed)
Donna Mayer, please assist in this request
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
## 2018-11-12 NOTE — Telephone Encounter (Signed)
I have mailed patient a letter for this

## 2018-11-16 ENCOUNTER — Telehealth: Payer: Self-pay

## 2018-11-16 NOTE — Telephone Encounter (Signed)
PA approved through 01/13/2020 by [facility]

## 2018-11-19 ENCOUNTER — Ambulatory Visit (HOSPITAL_COMMUNITY)
Admission: RE | Admit: 2018-11-19 | Discharge: 2018-11-19 | Disposition: A | Discharge status: Home / Self Care | Payer: Medicare HMO | Source: Ambulatory Visit | Attending: Gastroenterology | Admitting: Gastroenterology

## 2018-11-19 ENCOUNTER — Other Ambulatory Visit: Payer: Self-pay

## 2018-11-19 DIAGNOSIS — R1110 Vomiting, unspecified: Secondary | ICD-10-CM | POA: Diagnosis not present

## 2018-11-19 DIAGNOSIS — K219 Gastro-esophageal reflux disease without esophagitis: Secondary | ICD-10-CM | POA: Insufficient documentation

## 2018-11-22 ENCOUNTER — Encounter: Payer: Self-pay | Admitting: Gastroenterology

## 2018-11-22 DIAGNOSIS — K219 Gastro-esophageal reflux disease without esophagitis: Secondary | ICD-10-CM

## 2018-11-22 DIAGNOSIS — D509 Iron deficiency anemia, unspecified: Secondary | ICD-10-CM

## 2018-11-22 DIAGNOSIS — K50919 Crohn's disease, unspecified, with unspecified complications: Secondary | ICD-10-CM

## 2018-11-26 ENCOUNTER — Ambulatory Visit (INDEPENDENT_AMBULATORY_CARE_PROVIDER_SITE_OTHER): Payer: Medicare HMO

## 2018-11-26 ENCOUNTER — Other Ambulatory Visit: Payer: Self-pay | Admitting: Gastroenterology

## 2018-11-26 DIAGNOSIS — Z1159 Encounter for screening for other viral diseases: Secondary | ICD-10-CM

## 2018-11-29 LAB — SARS CORONAVIRUS 2 (TAT 6-24 HRS): SARS Coronavirus 2: NEGATIVE

## 2018-11-30 ENCOUNTER — Ambulatory Visit (AMBULATORY_SURGERY_CENTER): Payer: Medicare HMO | Admitting: Gastroenterology

## 2018-11-30 ENCOUNTER — Other Ambulatory Visit: Payer: Self-pay

## 2018-11-30 ENCOUNTER — Encounter: Payer: Self-pay | Admitting: Gastroenterology

## 2018-11-30 VITALS — BP 136/65 | HR 98 | Temp 98.0°F | Resp 20 | Ht 62.0 in | Wt 217.0 lb

## 2018-11-30 DIAGNOSIS — K2950 Unspecified chronic gastritis without bleeding: Secondary | ICD-10-CM

## 2018-11-30 DIAGNOSIS — E785 Hyperlipidemia, unspecified: Secondary | ICD-10-CM | POA: Diagnosis not present

## 2018-11-30 DIAGNOSIS — K3189 Other diseases of stomach and duodenum: Secondary | ICD-10-CM | POA: Diagnosis not present

## 2018-11-30 DIAGNOSIS — I10 Essential (primary) hypertension: Secondary | ICD-10-CM | POA: Diagnosis not present

## 2018-11-30 DIAGNOSIS — K219 Gastro-esophageal reflux disease without esophagitis: Secondary | ICD-10-CM | POA: Diagnosis not present

## 2018-11-30 DIAGNOSIS — K2951 Unspecified chronic gastritis with bleeding: Secondary | ICD-10-CM | POA: Diagnosis not present

## 2018-11-30 DIAGNOSIS — K2101 Gastro-esophageal reflux disease with esophagitis, with bleeding: Secondary | ICD-10-CM

## 2018-11-30 MED ORDER — SODIUM CHLORIDE 0.9 % IV SOLN: 500.0000 mL | Freq: Once | INTRAVENOUS | Status: DC

## 2018-11-30 MED ORDER — ESOMEPRAZOLE MAGNESIUM 40 MG PO CPDR
40.0000 mg | DELAYED_RELEASE_CAPSULE | Freq: Every day | ORAL | 6 refills | Status: DC
Start: 2018-11-30 — End: 2018-11-30

## 2018-11-30 MED ORDER — ESOMEPRAZOLE MAGNESIUM 40 MG PO CPDR: DELAYED_RELEASE_CAPSULE | ORAL | 6 refills | Status: DC

## 2018-11-30 NOTE — Op Note (Signed)
[facility] Patient Name: Donna Mayer Procedure Date: 11/30/2018 9:45 AM MRN: [id number] Endoscopist: Jackquline Denmark , MD Age: 63 Referring MD:  Date of Birth: [birth year] Gender: Female Account #: [id number] Procedure:                Upper GI endoscopy Indications:              #1. GERD and regurgitation in patient with H/O lap                            band 2010 (Dr Alroy Dust @ [place])                           #2. Annell Greening loss.                           #3. Chronic IDA. Medicines:                Monitored Anesthesia Care Procedure:                Pre-Anesthesia Assessment:                           - Prior to the procedure, a History and Physical                            was performed, and patient medications and                            allergies were reviewed. The patient's tolerance of                            previous anesthesia was also reviewed. The risks                            and benefits of the procedure and the sedation                            options and risks were discussed with the patient.                            All questions were answered, and informed consent                            was obtained. Prior Anticoagulants: The patient has                            taken no previous anticoagulant or antiplatelet                            agents. ASA Grade Assessment: II - A patient with                            mild systemic disease. After reviewing the risks  and benefits, the patient was deemed in                            satisfactory condition to undergo the procedure.                           After obtaining informed consent, the endoscope was                            passed under direct vision. Throughout the                            procedure, the patient's blood pressure, pulse, and                            oxygen saturations were monitored continuously. The                            Endoscope was  introduced through the mouth, and                            advanced to the second part of duodenum. The upper                            GI endoscopy was accomplished without difficulty.                            The patient tolerated the procedure well. Scope In: Scope Out: Findings:                 The examined esophagus was normal.                           The Z-line was regular and was found 38 cm from the                            incisors.                           Localized mild inflammation characterized by                            erythema was found in the gastric antrum. Biopsies                            were taken with a cold forceps for histology. J-                            shaped stomach.                           Impression of gastric band was visualized 4 cm                            below the GE junction on full distention  of the                            lower end of the esophagus. The lumen was wide                            open. No endoscopic complications of the lap-band                            were appreciated (stricture/erosion etc).                           The examined duodenum was normal. Biopsies for                            histology were taken with a cold forceps for                            evaluation of celiac disease. Complications:            No immediate complications. Estimated Blood Loss:     Estimated blood loss: none. Impression:               -Mild gastritis.                           -No endoscopic complications of lap band. The lumen                            is wide open. Recommendation:           - Patient has a contact number available for                            emergencies. The signs and symptoms of potential                            delayed complications were discussed with the                            patient. Return to normal activities tomorrow.                            Written discharge instructions were  provided to the                            patient.                           - Resume previous diet.                           - Continue Nexium 40 mg p.o. twice daily.                           - No aspirin, ibuprofen, naproxen, or other  non-steroidal anti-inflammatory drugs.                           - Await pathology results.                           - Return to GI clinic in 8 weeks. Jackquline Denmark, MD 11/30/2018 10:10:41 AM This report has been signed electronically.

## 2018-11-30 NOTE — Progress Notes (Signed)
Called to room to assist during endoscopic procedure.  Patient ID and intended procedure confirmed with present staff. Received instructions for my participation in the procedure from the performing physician.  

## 2018-11-30 NOTE — Patient Instructions (Signed)
YOU HAD AN ENDOSCOPIC PROCEDURE TODAY AT [facility]:   Refer to the procedure report that was given to you for any specific questions about what was found during the examination.  If the procedure report does not answer your questions, please call your gastroenterologist to clarify.  If you requested that your care partner not be given the details of your procedure findings, then the procedure report has been included in a sealed envelope for you to review at your convenience later.  YOU SHOULD EXPECT: Some feelings of bloating in the abdomen. Passage of more gas than usual.  Walking can help get rid of the air that was put into your GI tract during the procedure and reduce the bloating. If you had a lower endoscopy (such as a colonoscopy or flexible sigmoidoscopy) you may notice spotting of blood in your stool or on the toilet paper. If you underwent a bowel prep for your procedure, you may not have a normal bowel movement for a few days.  Please Note:  You might notice some irritation and congestion in your nose or some drainage.  This is from the oxygen used during your procedure.  There is no need for concern and it should clear up in a day or so.  SYMPTOMS TO REPORT IMMEDIATELY:   Following upper endoscopy (EGD)  Vomiting of blood or coffee ground material  New chest pain or pain under the shoulder blades  Painful or persistently difficult swallowing  New shortness of breath  Fever of 100F or higher  Black, tarry-looking stools  For urgent or emergent issues, a gastroenterologist can be reached at any hour by calling [phone].   DIET:  We do recommend a small meal at first, but then you may proceed to your regular diet.  Drink plenty of fluids but you should avoid alcoholic beverages for 24 hours.  ACTIVITY:  You should plan to take it easy for the rest of today and you should NOT DRIVE or use heavy machinery until tomorrow (because of the sedation medicines used  during the test).    FOLLOW UP: Our staff will call the number listed on your records 48-72 hours following your procedure to check on you and address any questions or concerns that you may have regarding the information given to you following your procedure. If we do not reach you, we will leave a message.  We will attempt to reach you two times.  During this call, we will ask if you have developed any symptoms of COVID 19. If you develop any symptoms (ie: fever, flu-like symptoms, shortness of breath, cough etc.) before then, please call [phone].  If you test positive for Covid 19 in the 2 weeks post procedure, please call and report this information to [place].    If any biopsies were taken you will be contacted by phone or by letter within the next 1-3 weeks.  Please call us at [phone] if you have not heard about the biopsies in 3 weeks.    SIGNATURES/CONFIDENTIALITY: You and/or your care partner have signed paperwork which will be entered into your electronic medical record.  These signatures attest to the fact that that the information above on your After Visit Summary has been reviewed and is understood.  Full responsibility of the confidentiality of this discharge information lies with you and/or your care-partner.   No aspirin,ibuprofen,naproxen,or other non-steroidal anti-inflammatory drugs,resume remainder of medications.. information given on gastritis.

## 2018-11-30 NOTE — Progress Notes (Signed)
Report to PACU, RN, vss, BBS= Clear.  

## 2018-11-30 NOTE — Progress Notes (Signed)
Pt's states no medical or surgical changes since previsit or office visit. 

## 2018-12-02 ENCOUNTER — Encounter: Payer: Self-pay | Admitting: Gastroenterology

## 2018-12-02 ENCOUNTER — Telehealth: Payer: Self-pay

## 2018-12-02 DIAGNOSIS — K219 Gastro-esophageal reflux disease without esophagitis: Secondary | ICD-10-CM

## 2018-12-02 MED ORDER — PANTOPRAZOLE SODIUM 40 MG PO TBEC
40.0000 mg | DELAYED_RELEASE_TABLET | Freq: Every day | ORAL | 6 refills | Status: DC
Start: 2018-12-02 — End: 2019-05-17

## 2018-12-02 NOTE — Telephone Encounter (Signed)
Called and spoke with patient-patient informed of MD recommendations; patient is agreeable with plan of care and verified pharmacy; RX sent to pharmacy of choice; Patient verbalized understanding of information/instructions;  Patient was advised to call the office at [phone] if questions/concerns arise;

## 2018-12-02 NOTE — Telephone Encounter (Signed)
  Follow up Call-  Call back number 11/30/2018  Post procedure Call Back phone  # [phone]  Permission to leave phone message Yes  Some recent data might be hidden     Patient questions:  Do you have a fever, pain , or abdominal swelling? No. Pain Score  0 *  Have you tolerated food without any problems? Yes.    Have you been able to return to your normal activities? Yes.    Do you have any questions about your discharge instructions: Diet   No. Medications  Yes.   Follow up visit  No.  Do you have questions or concerns about your Care? Yes.    Actions: * If pain score is 4 or above: No action needed, pain <4. 1. Have you developed a fever since your procedure? no  2.   Have you had an respiratory symptoms (SOB or cough) since your procedure? no  3.   Have you tested positive for COVID 19 since your procedure no  4.   Have you had any family members/close contacts diagnosed with the COVID 19 since your procedure?  No  Dr. Lyndel Safe, this patient is having trouble with her Nexium.  When she takes it she experiences "dizziness and feels very shaky".  She states that it happens whether she takes it once a day or twice a day, like you prescribed.  If possible she would like to try a different medication.   She uses the [facility] on [address]!   If yes to any of these questions please route to Joylene John, RN and Alphonsa Gin, RN.

## 2018-12-02 NOTE — Telephone Encounter (Signed)
Lets change Nexium to Protonix 40 mg p.o. once a day, 30 with 6 refills Thx RG

## 2018-12-13 ENCOUNTER — Telehealth: Payer: Self-pay | Admitting: Gastroenterology

## 2018-12-14 NOTE — Telephone Encounter (Signed)
Called and spoke with patient-patient informed of result note from letter sent to patient and MD recommendations; patient is agreeable with plan of care; Patient verbalized understanding of information/instructions;  Patient was advised to call the office at [phone] if questions/concerns arise;

## 2019-01-24 ENCOUNTER — Other Ambulatory Visit: Payer: Self-pay | Admitting: Family Medicine

## 2019-01-24 DIAGNOSIS — E782 Mixed hyperlipidemia: Secondary | ICD-10-CM | POA: Diagnosis not present

## 2019-01-24 DIAGNOSIS — F320 Major depressive disorder, single episode, mild: Secondary | ICD-10-CM | POA: Diagnosis not present

## 2019-01-24 DIAGNOSIS — D508 Other iron deficiency anemias: Secondary | ICD-10-CM | POA: Diagnosis not present

## 2019-01-24 DIAGNOSIS — Z6835 Body mass index (BMI) 35.0-35.9, adult: Secondary | ICD-10-CM | POA: Diagnosis not present

## 2019-01-24 DIAGNOSIS — Z1231 Encounter for screening mammogram for malignant neoplasm of breast: Secondary | ICD-10-CM

## 2019-01-24 DIAGNOSIS — I10 Essential (primary) hypertension: Secondary | ICD-10-CM | POA: Diagnosis not present

## 2019-01-24 DIAGNOSIS — K219 Gastro-esophageal reflux disease without esophagitis: Secondary | ICD-10-CM | POA: Diagnosis not present

## 2019-01-24 DIAGNOSIS — M545 Low back pain: Secondary | ICD-10-CM | POA: Diagnosis not present

## 2019-01-24 DIAGNOSIS — G4709 Other insomnia: Secondary | ICD-10-CM | POA: Diagnosis not present

## 2019-01-24 DIAGNOSIS — R634 Abnormal weight loss: Secondary | ICD-10-CM | POA: Diagnosis not present

## 2019-01-24 LAB — TSH: TSH: 0.01 — AB (ref <=5.90)

## 2019-02-01 DIAGNOSIS — E039 Hypothyroidism, unspecified: Secondary | ICD-10-CM | POA: Diagnosis not present

## 2019-02-03 LAB — TSH: TSH: 0 — AB (ref <=5.90)

## 2019-02-21 DIAGNOSIS — E0590 Thyrotoxicosis, unspecified without thyrotoxic crisis or storm: Secondary | ICD-10-CM | POA: Diagnosis not present

## 2019-02-22 DIAGNOSIS — R946 Abnormal results of thyroid function studies: Secondary | ICD-10-CM | POA: Diagnosis not present

## 2019-02-22 DIAGNOSIS — E0590 Thyrotoxicosis, unspecified without thyrotoxic crisis or storm: Secondary | ICD-10-CM | POA: Diagnosis not present

## 2019-02-28 ENCOUNTER — Encounter: Payer: Self-pay | Admitting: Family Medicine

## 2019-03-02 ENCOUNTER — Ambulatory Visit: Payer: Medicare HMO

## 2019-03-28 ENCOUNTER — Other Ambulatory Visit: Payer: Self-pay

## 2019-03-28 ENCOUNTER — Ambulatory Visit
Admission: RE | Admit: 2019-03-28 | Discharge: 2019-03-28 | Disposition: A | Discharge status: Home / Self Care | Payer: Medicare HMO | Source: Ambulatory Visit | Attending: Family Medicine | Admitting: Family Medicine

## 2019-03-28 DIAGNOSIS — Z1231 Encounter for screening mammogram for malignant neoplasm of breast: Secondary | ICD-10-CM | POA: Diagnosis not present

## 2019-03-31 ENCOUNTER — Other Ambulatory Visit: Payer: Self-pay

## 2019-03-31 DIAGNOSIS — E0590 Thyrotoxicosis, unspecified without thyrotoxic crisis or storm: Secondary | ICD-10-CM

## 2019-04-01 ENCOUNTER — Other Ambulatory Visit: Payer: Self-pay

## 2019-04-01 ENCOUNTER — Other Ambulatory Visit: Payer: Medicare HMO

## 2019-04-01 DIAGNOSIS — E0590 Thyrotoxicosis, unspecified without thyrotoxic crisis or storm: Secondary | ICD-10-CM

## 2019-04-02 LAB — TSH: TSH: <0.005 u[IU]/mL — ABNORMAL LOW (ref 0.450–4.500)

## 2019-04-02 LAB — T4, FREE: Free T4: 5.38 ng/dL — ABNORMAL HIGH (ref 0.82–1.77)

## 2019-04-05 ENCOUNTER — Other Ambulatory Visit: Payer: Self-pay | Admitting: Family Medicine

## 2019-04-05 MED ORDER — METHIMAZOLE 10 MG PO TABS: 10.0000 mg | ORAL_TABLET | Freq: Three times a day (TID) | ORAL | 0 refills | Status: DC

## 2019-04-06 ENCOUNTER — Other Ambulatory Visit: Payer: Self-pay | Admitting: Family Medicine

## 2019-04-12 DIAGNOSIS — E0590 Thyrotoxicosis, unspecified without thyrotoxic crisis or storm: Secondary | ICD-10-CM | POA: Diagnosis not present

## 2019-04-12 DIAGNOSIS — E0500 Thyrotoxicosis with diffuse goiter without thyrotoxic crisis or storm: Secondary | ICD-10-CM | POA: Diagnosis not present

## 2019-04-25 ENCOUNTER — Other Ambulatory Visit: Payer: Self-pay | Admitting: Family Medicine

## 2019-04-25 MED ORDER — METHIMAZOLE 10 MG PO TABS: 10.0000 mg | ORAL_TABLET | Freq: Three times a day (TID) | ORAL | 0 refills | Status: DC

## 2019-04-25 MED ORDER — ZOLPIDEM TARTRATE ER 12.5 MG PO TBCR: 12.5000 mg | EXTENDED_RELEASE_TABLET | Freq: Every evening | ORAL | 0 refills | Status: DC | PRN

## 2019-04-26 ENCOUNTER — Telehealth: Payer: Self-pay

## 2019-04-26 ENCOUNTER — Other Ambulatory Visit: Payer: Self-pay | Admitting: Family Medicine

## 2019-04-26 ENCOUNTER — Ambulatory Visit: Payer: Medicare HMO | Admitting: Family Medicine

## 2019-04-26 DIAGNOSIS — I10 Essential (primary) hypertension: Secondary | ICD-10-CM

## 2019-04-26 DIAGNOSIS — R7301 Impaired fasting glucose: Secondary | ICD-10-CM

## 2019-04-26 DIAGNOSIS — E782 Mixed hyperlipidemia: Secondary | ICD-10-CM

## 2019-04-26 DIAGNOSIS — E0590 Thyrotoxicosis, unspecified without thyrotoxic crisis or storm: Secondary | ICD-10-CM

## 2019-04-26 NOTE — Telephone Encounter (Signed)
I tried to increase zolpidem to 12.5 because trevonda told me she has been taking 10 mg 1 1/2 pills a night. Obviously she is going to run out. I do not believe she has tried either trazodone or belsomra in the past. Double check with her and see if she would like to change or continue zolpidem 10 mg ONCE DAILY AT NIGHT. Kc

## 2019-04-26 NOTE — Telephone Encounter (Signed)
Pharmacy sent over fax with comment stating that Zolpidem is not covered by insurance, preferred alternative is Trazodone or Belsomra. Please advise

## 2019-04-27 ENCOUNTER — Other Ambulatory Visit: Payer: Self-pay

## 2019-04-28 MED ORDER — ZOLPIDEM TARTRATE ER 12.5 MG PO TBCR: 12.5000 mg | EXTENDED_RELEASE_TABLET | Freq: Every evening | ORAL | 0 refills | Status: DC | PRN

## 2019-04-28 NOTE — Telephone Encounter (Signed)
Patient is willing to try Trazodone.  She did get the zolpidem 12.5 mg but she paid out of pocket with an RX card.

## 2019-04-29 ENCOUNTER — Other Ambulatory Visit: Payer: Self-pay | Admitting: Family Medicine

## 2019-04-29 MED ORDER — TRAZODONE HCL 50 MG PO TABS: 25.0000 mg | ORAL_TABLET | Freq: Every evening | ORAL | 3 refills | Status: DC | PRN

## 2019-05-02 ENCOUNTER — Other Ambulatory Visit: Payer: Self-pay

## 2019-05-13 ENCOUNTER — Other Ambulatory Visit: Payer: Self-pay | Admitting: Family Medicine

## 2019-05-13 ENCOUNTER — Other Ambulatory Visit: Payer: Medicare HMO

## 2019-05-13 DIAGNOSIS — I10 Essential (primary) hypertension: Secondary | ICD-10-CM

## 2019-05-13 DIAGNOSIS — E0590 Thyrotoxicosis, unspecified without thyrotoxic crisis or storm: Secondary | ICD-10-CM | POA: Insufficient documentation

## 2019-05-13 HISTORY — DX: Thyrotoxicosis, unspecified without thyrotoxic crisis or storm: E05.90

## 2019-05-13 HISTORY — DX: Essential (primary) hypertension: I10

## 2019-05-13 NOTE — Progress Notes (Signed)
Order labs. Kc

## 2019-05-16 ENCOUNTER — Encounter: Payer: Self-pay | Admitting: Family Medicine

## 2019-05-16 DIAGNOSIS — F419 Anxiety disorder, unspecified: Secondary | ICD-10-CM | POA: Insufficient documentation

## 2019-05-16 DIAGNOSIS — I10 Essential (primary) hypertension: Secondary | ICD-10-CM | POA: Insufficient documentation

## 2019-05-16 DIAGNOSIS — E782 Mixed hyperlipidemia: Secondary | ICD-10-CM | POA: Insufficient documentation

## 2019-05-16 DIAGNOSIS — K219 Gastro-esophageal reflux disease without esophagitis: Secondary | ICD-10-CM | POA: Insufficient documentation
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
## 2019-05-17 ENCOUNTER — Ambulatory Visit (INDEPENDENT_AMBULATORY_CARE_PROVIDER_SITE_OTHER): Payer: Medicare HMO | Admitting: Family Medicine

## 2019-05-17 ENCOUNTER — Encounter: Payer: Self-pay | Admitting: Family Medicine

## 2019-05-17 ENCOUNTER — Other Ambulatory Visit: Payer: Self-pay

## 2019-05-17 ENCOUNTER — Telehealth: Payer: Self-pay | Admitting: Family Medicine

## 2019-05-17 ENCOUNTER — Other Ambulatory Visit: Payer: Self-pay | Admitting: Family Medicine

## 2019-05-17 ENCOUNTER — Telehealth: Payer: Self-pay

## 2019-05-17 VITALS — BP 140/78 | HR 104 | Temp 97.4°F | Ht 63.0 in | Wt 185.0 lb

## 2019-05-17 DIAGNOSIS — R002 Palpitations: Secondary | ICD-10-CM

## 2019-05-17 DIAGNOSIS — I10 Essential (primary) hypertension: Secondary | ICD-10-CM | POA: Diagnosis not present

## 2019-05-17 DIAGNOSIS — F419 Anxiety disorder, unspecified: Secondary | ICD-10-CM

## 2019-05-17 DIAGNOSIS — F330 Major depressive disorder, recurrent, mild: Secondary | ICD-10-CM | POA: Diagnosis not present

## 2019-05-17 DIAGNOSIS — K219 Gastro-esophageal reflux disease without esophagitis: Secondary | ICD-10-CM | POA: Diagnosis not present

## 2019-05-17 DIAGNOSIS — E782 Mixed hyperlipidemia: Secondary | ICD-10-CM | POA: Diagnosis not present

## 2019-05-17 DIAGNOSIS — E0590 Thyrotoxicosis, unspecified without thyrotoxic crisis or storm: Secondary | ICD-10-CM | POA: Diagnosis not present

## 2019-05-17 MED ORDER — ATENOLOL 50 MG PO TABS: 50.0000 mg | ORAL_TABLET | Freq: Two times a day (BID) | ORAL | 0 refills | Status: DC

## 2019-05-17 MED ORDER — VORTIOXETINE HBR 20 MG PO TABS: 20.0000 mg | ORAL_TABLET | Freq: Every day | ORAL | 1 refills | Status: DC

## 2019-05-17 NOTE — Progress Notes (Signed)
  Chronic Care Management   Outreach Note  05/17/2019 Name: Donna Mayer MRN: [id number] DOB: July 29, 1955  Referred by: [name], MD Reason for referral : No chief complaint on file.   An unsuccessful telephone outreach was attempted today. The patient was referred to the pharmacist for assistance with care management and care coordination.   This note is not being shared with the patient for the following reason: To respect privacy (The patient or proxy has requested that the information not be shared).  Follow Up Plan:   Earney Hamburg Upstream Scheduler

## 2019-05-17 NOTE — Patient Instructions (Addendum)
HYPERTHYROIDISM: Increase atenolol to 50 mg one twice a day (also for high blood pressure.)  I plan to increase methimazole after labs come back.  Please call if you have not heard from the endocrinologist within 24 hours of getting labs back.   Hypertension: Continue valsartan 80 mg once daily, clonidine 0.1 mg one twice a day, increase atenolol to 50 mg one twice a day.   Depression: Increase trintellix to 20 mg once daily in am (May take trintellix 10 mg 2 daily.)  Insomnia: Zolpidem 10 mg once before bed.

## 2019-05-17 NOTE — Telephone Encounter (Signed)
Fax from pharmacy states that trintellix is not covered by insurance, they're requesting a alternate medication however, there are no preferred medications listed from them.

## 2019-05-18 ENCOUNTER — Other Ambulatory Visit: Payer: Self-pay | Admitting: Family Medicine

## 2019-05-18 ENCOUNTER — Encounter: Payer: Self-pay | Admitting: Family Medicine

## 2019-05-18 LAB — COMPREHENSIVE METABOLIC PANEL
ALT: 10 IU/L (ref 0–32)
AST: 16 IU/L (ref 0–40)
Albumin/Globulin Ratio: 1.2 (ref 1.2–2.2)
Albumin: 4 g/dL (ref 3.8–4.8)
Alkaline Phosphatase: 144 IU/L — ABNORMAL HIGH (ref 39–117)
BUN/Creatinine Ratio: 33 — ABNORMAL HIGH (ref 12–28)
BUN: 15 mg/dL (ref 8–27)
Bilirubin Total: 0.6 mg/dL (ref 0.0–1.2)
CO2: 25 mmol/L (ref 20–29)
Calcium: 10 mg/dL (ref 8.7–10.3)
Chloride: 104 mmol/L (ref 96–106)
Creatinine, Ser: 0.45 mg/dL — ABNORMAL LOW (ref 0.57–1.00)
GFR calc Af Amer: 125 mL/min/{1.73_m2} (ref >59)
GFR calc non Af Amer: 108 mL/min/{1.73_m2} (ref >59)
Globulin, Total: 3.4 g/dL (ref 1.5–4.5)
Glucose: 102 mg/dL — ABNORMAL HIGH (ref 65–99)
Potassium: 3.5 mmol/L (ref 3.5–5.2)
Sodium: 143 mmol/L (ref 134–144)
Total Protein: 7.4 g/dL (ref 6.0–8.5)

## 2019-05-18 LAB — CBC WITH DIFFERENTIAL/PLATELET
Basophils Absolute: 0 10*3/uL (ref 0.0–0.2)
Basos: 0 %
EOS (ABSOLUTE): 0.1 10*3/uL (ref 0.0–0.4)
Eos: 3 %
Hematocrit: 32.3 % — ABNORMAL LOW (ref 34.0–46.6)
Hemoglobin: 10.1 g/dL — ABNORMAL LOW (ref 11.1–15.9)
Immature Grans (Abs): 0 10*3/uL (ref 0.0–0.1)
Immature Granulocytes: 0 %
Lymphocytes Absolute: 1.1 10*3/uL (ref 0.7–3.1)
Lymphs: 28 %
MCH: 23.7 pg — ABNORMAL LOW (ref 26.6–33.0)
MCHC: 31.3 g/dL — ABNORMAL LOW (ref 31.5–35.7)
MCV: 76 fL — ABNORMAL LOW (ref 79–97)
Monocytes Absolute: 0.5 10*3/uL (ref 0.1–0.9)
Monocytes: 11 %
Neutrophils Absolute: 2.3 10*3/uL (ref 1.4–7.0)
Neutrophils: 58 %
Platelets: 228 10*3/uL (ref 150–450)
RBC: 4.27 x10E6/uL (ref 3.77–5.28)
RDW: 14.2 % (ref 11.7–15.4)
WBC: 4.1 10*3/uL (ref 3.4–10.8)

## 2019-05-18 LAB — LIPID PANEL
Chol/HDL Ratio: 2.5 ratio (ref 0.0–4.4)
Cholesterol, Total: 148 mg/dL (ref 100–199)
HDL: 60 mg/dL (ref >39)
LDL Chol Calc (NIH): 75 mg/dL (ref 0–99)
Triglycerides: 62 mg/dL (ref 0–149)
VLDL Cholesterol Cal: 13 mg/dL (ref 5–40)

## 2019-05-18 LAB — T3, FREE
T3, Free: 16 pg/mL — ABNORMAL HIGH (ref 2.0–4.4)
T3, Free: 16.2 pg/mL — ABNORMAL HIGH (ref 2.0–4.4)

## 2019-05-18 LAB — TSH: TSH: <0.005 u[IU]/mL — ABNORMAL LOW (ref 0.450–4.500)

## 2019-05-18 LAB — T4, FREE: Free T4: 5.35 ng/dL — ABNORMAL HIGH (ref 0.82–1.77)

## 2019-05-18 LAB — CARDIOVASCULAR RISK ASSESSMENT

## 2019-05-18 MED ORDER — METHIMAZOLE 10 MG PO TABS: 20.0000 mg | ORAL_TABLET | Freq: Three times a day (TID) | ORAL | 0 refills | Status: DC

## 2019-05-18 NOTE — Telephone Encounter (Signed)
PA done for Trintellix, approved via covermymeds.

## 2019-05-19 DIAGNOSIS — R002 Palpitations: Secondary | ICD-10-CM

## 2019-05-19 HISTORY — DX: Palpitations: R00.2

## 2019-05-23 ENCOUNTER — Telehealth: Payer: Self-pay

## 2019-05-23 NOTE — Telephone Encounter (Signed)
Donna Mayer called to report that her medication is making her sick.  She is vomiting after taking her medication.  She also wanted to make Dr. Tobie Poet aware of a fall because her knee gave way.

## 2019-05-24 ENCOUNTER — Emergency Department (HOSPITAL_COMMUNITY): Payer: Medicare HMO

## 2019-05-24 ENCOUNTER — Encounter (HOSPITAL_COMMUNITY): Payer: Self-pay | Admitting: Emergency Medicine

## 2019-05-24 ENCOUNTER — Emergency Department (HOSPITAL_COMMUNITY)
Admission: EM | Admit: 2019-05-24 | Discharge: 2019-05-24 | Disposition: A | Discharge status: Home / Self Care | Payer: Medicare HMO | Attending: Emergency Medicine | Admitting: Emergency Medicine

## 2019-05-24 ENCOUNTER — Encounter: Payer: Self-pay | Admitting: Family Medicine

## 2019-05-24 ENCOUNTER — Ambulatory Visit (INDEPENDENT_AMBULATORY_CARE_PROVIDER_SITE_OTHER): Payer: Medicare HMO | Admitting: Family Medicine

## 2019-05-24 ENCOUNTER — Other Ambulatory Visit: Payer: Self-pay

## 2019-05-24 VITALS — BP 132/86 | HR 102 | Temp 97.5°F | Resp 18 | Ht 62.0 in | Wt 185.6 lb

## 2019-05-24 DIAGNOSIS — R634 Abnormal weight loss: Secondary | ICD-10-CM | POA: Insufficient documentation

## 2019-05-24 DIAGNOSIS — F332 Major depressive disorder, recurrent severe without psychotic features: Secondary | ICD-10-CM | POA: Insufficient documentation

## 2019-05-24 DIAGNOSIS — R002 Palpitations: Secondary | ICD-10-CM | POA: Diagnosis not present

## 2019-05-24 DIAGNOSIS — R1110 Vomiting, unspecified: Secondary | ICD-10-CM | POA: Insufficient documentation

## 2019-05-24 DIAGNOSIS — E0500 Thyrotoxicosis with diffuse goiter without thyrotoxic crisis or storm: Secondary | ICD-10-CM

## 2019-05-24 DIAGNOSIS — Z5321 Procedure and treatment not carried out due to patient leaving prior to being seen by health care provider: Secondary | ICD-10-CM | POA: Insufficient documentation

## 2019-05-24 DIAGNOSIS — F330 Major depressive disorder, recurrent, mild: Secondary | ICD-10-CM | POA: Insufficient documentation

## 2019-05-24 DIAGNOSIS — R0602 Shortness of breath: Secondary | ICD-10-CM | POA: Insufficient documentation

## 2019-05-24 HISTORY — DX: Major depressive disorder, recurrent severe without psychotic features: F33.2

## 2019-05-24 HISTORY — DX: Major depressive disorder, recurrent, mild: F33.0

## 2019-05-24 HISTORY — DX: Vomiting, unspecified: R11.10

## 2019-05-24 HISTORY — DX: Thyrotoxicosis with diffuse goiter without thyrotoxic crisis or storm: E05.00

## 2019-05-24 HISTORY — DX: Abnormal weight loss: R63.4

## 2019-05-24 LAB — BASIC METABOLIC PANEL WITH GFR
Anion gap: 11 (ref 5–15)
BUN: 11 mg/dL (ref 8–23)
CO2: 25 mmol/L (ref 22–32)
Calcium: 9.2 mg/dL (ref 8.9–10.3)
Chloride: 104 mmol/L (ref 98–111)
Creatinine, Ser: 0.46 mg/dL (ref 0.44–1.00)
GFR calc Af Amer: >60 mL/min
GFR calc non Af Amer: >60 mL/min
Glucose, Bld: 145 mg/dL — ABNORMAL HIGH (ref 70–99)
Potassium: 3.2 mmol/L — ABNORMAL LOW (ref 3.5–5.1)
Sodium: 140 mmol/L (ref 135–145)

## 2019-05-24 LAB — CBC
HCT: 30.9 % — ABNORMAL LOW (ref 36.0–46.0)
Hemoglobin: 9.6 g/dL — ABNORMAL LOW (ref 12.0–15.0)
MCH: 24 pg — ABNORMAL LOW (ref 26.0–34.0)
MCHC: 31.1 g/dL (ref 30.0–36.0)
MCV: 77.3 fL — ABNORMAL LOW (ref 80.0–100.0)
Platelets: 218 10*3/uL (ref 150–400)
RBC: 4 MIL/uL (ref 3.87–5.11)
RDW: 14.5 % (ref 11.5–15.5)
WBC: 5.5 10*3/uL (ref 4.0–10.5)
nRBC: 0 % (ref 0.0–0.2)

## 2019-05-24 LAB — TROPONIN I (HIGH SENSITIVITY): Troponin I (High Sensitivity): 19 ng/L — ABNORMAL HIGH

## 2019-05-24 MED ORDER — SODIUM CHLORIDE 0.9% FLUSH: 3.0000 mL | Freq: Once | INTRAVENOUS | Status: DC

## 2019-05-24 NOTE — ED Triage Notes (Signed)
Pt sent by PCP, c/o shortness of breath, worse with exertion, and palpitations. Hx hyperthyroidism.

## 2019-05-24 NOTE — ED Notes (Signed)
Pt states that she is leaving due to wait times
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
## 2019-05-24 NOTE — ED Notes (Signed)
Pt states that she is going to sit outside

## 2019-05-26 ENCOUNTER — Other Ambulatory Visit: Payer: Self-pay | Admitting: Family Medicine

## 2019-06-10 ENCOUNTER — Other Ambulatory Visit: Payer: Medicare HMO

## 2019-06-10 ENCOUNTER — Other Ambulatory Visit: Payer: Self-pay

## 2019-06-10 DIAGNOSIS — E0590 Thyrotoxicosis, unspecified without thyrotoxic crisis or storm: Secondary | ICD-10-CM

## 2019-06-11 LAB — T3, FREE: T3, Free: 9.9 pg/mL — ABNORMAL HIGH (ref 2.0–4.4)

## 2019-06-11 LAB — T4, FREE: Free T4: 4.5 ng/dL — ABNORMAL HIGH (ref 0.82–1.77)

## 2019-06-11 LAB — TSH: TSH: <0.005 u[IU]/mL — ABNORMAL LOW (ref 0.450–4.500)

## 2019-06-15 ENCOUNTER — Encounter: Payer: Self-pay | Admitting: Family Medicine

## 2019-06-22 ENCOUNTER — Telehealth: Payer: Self-pay | Admitting: Family Medicine

## 2019-06-22 NOTE — Progress Notes (Signed)
  Chronic Care Management   Note  06/22/2019 Name: Donna Mayer MRN: [id number] DOB: [birth year]  [name] is a 64 y.o. year old female who is a primary care patient of Cox, Kirsten, MD. I reached out to [name] by phone today in response to a referral sent by [name]'s PCP, Cox, Kirsten, MD.   [name] was given information about Chronic Care Management services today including:  1. CCM service includes personalized support from designated clinical staff supervised by her physician, including individualized plan of care and coordination with other care providers 2. 24/7 contact phone numbers for assistance for urgent and routine care needs. 3. Service will only be billed when office clinical staff spend 20 minutes or more in a month to coordinate care. 4. Only one practitioner may furnish and bill the service in a calendar month. 5. The patient may stop CCM services at any time (effective at the end of the month) by phone call to the office staff.   Patient agreed to services and verbal consent obtained.   This note is not being shared with the patient for the following reason: To respect privacy (The patient or proxy has requested that the information not be shared). Follow up plan:   Earney Hamburg Upstream Scheduler

## 2019-06-29 ENCOUNTER — Ambulatory Visit: Payer: Medicare HMO | Admitting: Family Medicine

## 2019-06-29 DIAGNOSIS — E0590 Thyrotoxicosis, unspecified without thyrotoxic crisis or storm: Secondary | ICD-10-CM | POA: Diagnosis not present

## 2019-07-04 ENCOUNTER — Other Ambulatory Visit: Payer: Self-pay | Admitting: Family Medicine

## 2019-08-01 ENCOUNTER — Other Ambulatory Visit: Payer: Self-pay | Admitting: Family Medicine

## 2019-08-02 ENCOUNTER — Other Ambulatory Visit: Payer: Self-pay

## 2019-08-02 ENCOUNTER — Ambulatory Visit: Payer: Medicare HMO

## 2019-08-02 DIAGNOSIS — R7301 Impaired fasting glucose: Secondary | ICD-10-CM | POA: Diagnosis not present

## 2019-08-02 DIAGNOSIS — E0590 Thyrotoxicosis, unspecified without thyrotoxic crisis or storm: Secondary | ICD-10-CM | POA: Diagnosis not present

## 2019-08-02 DIAGNOSIS — I10 Essential (primary) hypertension: Secondary | ICD-10-CM | POA: Diagnosis not present

## 2019-08-02 DIAGNOSIS — E782 Mixed hyperlipidemia: Secondary | ICD-10-CM | POA: Diagnosis not present

## 2019-08-03 LAB — CBC WITH DIFFERENTIAL/PLATELET
Basophils Absolute: 0 10*3/uL (ref 0.0–0.2)
Basos: 0 %
EOS (ABSOLUTE): 0.1 10*3/uL (ref 0.0–0.4)
Eos: 2 %
Hematocrit: 29.8 % — ABNORMAL LOW (ref 34.0–46.6)
Hemoglobin: 9.4 g/dL — ABNORMAL LOW (ref 11.1–15.9)
Immature Grans (Abs): 0 10*3/uL (ref 0.0–0.1)
Immature Granulocytes: 1 %
Lymphocytes Absolute: 1.8 10*3/uL (ref 0.7–3.1)
Lymphs: 31 %
MCH: 23.9 pg — ABNORMAL LOW (ref 26.6–33.0)
MCHC: 31.5 g/dL (ref 31.5–35.7)
MCV: 76 fL — ABNORMAL LOW (ref 79–97)
Monocytes Absolute: 0.7 10*3/uL (ref 0.1–0.9)
Monocytes: 11 %
Neutrophils Absolute: 3.3 10*3/uL (ref 1.4–7.0)
Neutrophils: 55 %
Platelets: 234 10*3/uL (ref 150–450)
RBC: 3.94 x10E6/uL (ref 3.77–5.28)
RDW: 14.8 % (ref 11.7–15.4)
WBC: 5.9 10*3/uL (ref 3.4–10.8)

## 2019-08-03 LAB — COMPREHENSIVE METABOLIC PANEL
ALT: 10 IU/L (ref 0–32)
AST: 15 IU/L (ref 0–40)
Albumin/Globulin Ratio: 1.2 (ref 1.2–2.2)
Albumin: 4 g/dL (ref 3.8–4.8)
Alkaline Phosphatase: 149 IU/L — ABNORMAL HIGH (ref 48–121)
BUN/Creatinine Ratio: 34 — ABNORMAL HIGH (ref 12–28)
BUN: 11 mg/dL (ref 8–27)
Bilirubin Total: 0.8 mg/dL (ref 0.0–1.2)
CO2: 24 mmol/L (ref 20–29)
Calcium: 9.3 mg/dL (ref 8.7–10.3)
Chloride: 101 mmol/L (ref 96–106)
Creatinine, Ser: 0.32 mg/dL — ABNORMAL LOW (ref 0.57–1.00)
GFR calc Af Amer: 140 mL/min/{1.73_m2} (ref >59)
GFR calc non Af Amer: 121 mL/min/{1.73_m2} (ref >59)
Globulin, Total: 3.3 g/dL (ref 1.5–4.5)
Glucose: 93 mg/dL (ref 65–99)
Potassium: 3.6 mmol/L (ref 3.5–5.2)
Sodium: 137 mmol/L (ref 134–144)
Total Protein: 7.3 g/dL (ref 6.0–8.5)

## 2019-08-03 LAB — LIPID PANEL
Chol/HDL Ratio: 2.6 ratio (ref 0.0–4.4)
Cholesterol, Total: 143 mg/dL (ref 100–199)
HDL: 56 mg/dL (ref >39)
LDL Chol Calc (NIH): 73 mg/dL (ref 0–99)
Triglycerides: 72 mg/dL (ref 0–149)
VLDL Cholesterol Cal: 14 mg/dL (ref 5–40)

## 2019-08-03 LAB — T4, FREE: Free T4: 6.41 ng/dL — ABNORMAL HIGH (ref 0.82–1.77)

## 2019-08-03 LAB — CARDIOVASCULAR RISK ASSESSMENT

## 2019-08-03 LAB — HEMOGLOBIN A1C

## 2019-08-03 LAB — TSH: TSH: <0.005 u[IU]/mL — ABNORMAL LOW (ref 0.450–4.500)

## 2019-08-15 ENCOUNTER — Other Ambulatory Visit: Payer: Self-pay

## 2019-08-15 ENCOUNTER — Encounter: Payer: Self-pay | Admitting: Family Medicine

## 2019-08-15 ENCOUNTER — Ambulatory Visit (INDEPENDENT_AMBULATORY_CARE_PROVIDER_SITE_OTHER): Payer: Medicare HMO | Admitting: Family Medicine

## 2019-08-15 VITALS — BP 150/82 | HR 100 | Temp 97.5°F | Ht 62.0 in | Wt 180.0 lb

## 2019-08-15 DIAGNOSIS — E782 Mixed hyperlipidemia: Secondary | ICD-10-CM | POA: Diagnosis not present

## 2019-08-15 DIAGNOSIS — E0500 Thyrotoxicosis with diffuse goiter without thyrotoxic crisis or storm: Secondary | ICD-10-CM

## 2019-08-15 DIAGNOSIS — I10 Essential (primary) hypertension: Secondary | ICD-10-CM | POA: Diagnosis not present

## 2019-08-15 DIAGNOSIS — K219 Gastro-esophageal reflux disease without esophagitis: Secondary | ICD-10-CM | POA: Diagnosis not present

## 2019-08-15 DIAGNOSIS — F419 Anxiety disorder, unspecified: Secondary | ICD-10-CM | POA: Diagnosis not present

## 2019-08-15 DIAGNOSIS — F330 Major depressive disorder, recurrent, mild: Secondary | ICD-10-CM

## 2019-08-15 MED ORDER — NEXIUM 40 MG PO CPDR: DELAYED_RELEASE_CAPSULE | ORAL | 3 refills | Status: DC
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
## 2019-08-17 NOTE — Chronic Care Management (AMB) (Signed)
Chronic Care Management Pharmacy  Name: LINETTE GUNDERSON  MRN: [id number] DOB: 1955/09/02  Chief Complaint/ HPI  [name],  64 y.o. , female presents for their Initial CCM visit with the clinical pharmacist via telephone due to COVID-19 Pandemic.  PCP : [name], MD  Their chronic conditions include: hypertension, GERD, hyperthyroidism, graves disease, anxiety, hyperlipidemia, depression.   Office Visits:   08/15/2019 - continue current medications.   05/24/2019 - patient having nausea/vomiting with medciation. BP elevated at hom 170-190/80/90. Vomiting daily. Reduced Trintellix to 10 mg and methomazole 15 mg tid.   05/17/2019 - Increase methimazole to 20 mg tid, Increase Trintellix 20 mg once daily. Increase atenolol50 mg bid for palpitations.   05/13/2019 - Patient is still anemic. Please see how much iron sulfate she is taking. Double it. Kidney function normal. Liver function normal. Tsh, T4 and T3 all still very abnormal. Increase methimazole to 15 mg one three times a day. Recheck in 6 weeks. The increase in atenolol should make her feel better while the methimazole is getting in her system.   04/26/2019 - Added trazodone to zopidem. Anemia is stable. Liver and kidney function stable. Cholesterol well controlled. A1c not done. Pt is prediabetic and has been well controlled. Patient's thyroid is still very abnormal. I will forward this to Dr. Janese Banks, her endocrinologist.   Consult Visit:   06/30/2019 - Increase methimazole slowly to 40 mg/day. Alk phosphatase increased to to increased bone turnover from hyperthyroidism with normal AST, ALT and Tbili. Anemia per CBC.   06/29/2019 - Endocrinology - continue methimazole 10 mg tid and atenolol 50 mg bid. Adjust based on TFTs.   05/24/2019 [facility] - Patient left ED after triage.   04/21/2019 -Endocrinology -  Reviewed recent thyroid labs with patient. Which suggest Graves' disease. Currently taking MMI 10 mg TID.  Dose recently adjusted. Plan to continue MMI 10 mg TID for now and repeat TFT in 4 weeks. Patient has lab apt 05/13/19 to have TFT completed and she will fax to our office for review. Free t4 has slightly improved.   04/12/2019 - Endocrinology - labs and ultrasound ordered. Continue methimazole treatment.   Medications: Outpatient Encounter Medications as of 08/18/2019  Medication Sig  . atenolol (TENORMIN) 50 MG tablet TAKE 1 TABLET(50 MG) BY MOUTH TWICE DAILY  . cholecalciferol (VITAMIN D3) 25 MCG (1000 UNIT) tablet Take 1,000 Units by mouth 3 (three) times a week.  . cloNIDine (CATAPRES) 0.1 MG tablet TAKE 1 TABLET TWICE DAILY  . ferrous sulfate 325 (65 FE) MG tablet Take 325 mg by mouth daily with breakfast.  . Fexofenadine HCl (ALLEGRA PO) Take 1 tablet by mouth daily.  . methimazole (TAPAZOLE) 10 MG tablet TAKE 2 TABLETS(20 MG) BY MOUTH THREE TIMES DAILY (Patient taking differently: Take 10 mg by mouth 2 (two) times daily. )  . NEXIUM 40 MG capsule Take twice a day by mouth.  . rosuvastatin (CRESTOR) 20 MG tablet Take 20 mg by mouth daily.  . traMADol (ULTRAM) 50 MG tablet   . valsartan (DIOVAN) 40 MG tablet Take 40 mg by mouth daily.  Marland Kitchen zolpidem (AMBIEN CR) 12.5 MG CR tablet TAKE 1 TABLET(12.5 MG) BY MOUTH AT BEDTIME AS NEEDED FOR SLEEP  . traZODone (DESYREL) 50 MG tablet Take 0.5-1 tablets (25-50 mg total) by mouth at bedtime as needed for sleep. (Patient not taking: Reported on 08/18/2019)  . vortioxetine HBr (TRINTELLIX) 20 MG TABS tablet Take 1 tablet (20 mg total)  by mouth daily. (Patient not taking: Reported on 08/18/2019)  . [DISCONTINUED] esomeprazole (NEXIUM) 40 MG capsule Take twice a day by mouth.   Facility-Administered Encounter Medications as of 08/18/2019  Medication  . 0.9 %  sodium chloride infusion   No Known Allergies  SDOH Screenings   Alcohol Screen:   . Last Alcohol Screening Score (AUDIT):   Depression (PHQ2-9): Medium Risk  . PHQ-2 Score: 22  Financial Resource  Strain:   . Difficulty of Paying Living Expenses:   Food Insecurity: No Food Insecurity  . Worried About Charity fundraiser in the Last Year: Never true  . Ran Out of Food in the Last Year: Never true  Housing: Low Risk   . Last Housing Risk Score: 0  Physical Activity:   . Days of Exercise per Week:   . Minutes of Exercise per Session:   Social Connections:   . Frequency of Communication with Friends and Family:   . Frequency of Social Gatherings with Friends and Family:   . Attends Religious Services:   . Active Member of Clubs or Organizations:   . Attends Archivist Meetings:   Marland Kitchen Marital Status:   Stress:   . Feeling of Stress :   Tobacco Use: Low Risk   . Smoking Tobacco Use: Never Smoker  . Smokeless Tobacco Use: Never Used  Transportation Needs: No Transportation Needs  . Lack of Transportation (Medical): No  . Lack of Transportation (Non-Medical): No     Current Diagnosis/Assessment:  Goals Addressed            This Visit's Progress   . Pharmacy Care Plan       CARE PLAN ENTRY (see longitudinal plan of care for additional care plan information)  Current Barriers:  . Chronic Disease Management support, education, and care coordination needs related to Hypertension, Hyperlipidemia, Depression, Hyperthyroidism and Anxiety   Hypertension BP Readings from Last 3 Encounters:  08/15/19 (!) 150/82  05/24/19 (!) 195/88  05/24/19 132/86   . Pharmacist Clinical Goal(s): o Over the next 90 days, patient will work with PharmD and providers to achieve BP goal <140/90 . Current regimen:  o Clonidine 0.1 mg twice daily o Valsartan 40 mg daily  . Interventions: o Recommended patient continue taking medication as prescribed.  . Patient self care activities - Over the next 90 days, patient will: o Check BP weekly, document, and provide at future appointments o Ensure daily salt intake < 2300 mg/day  Hyperlipidemia Lab Results  Component Value Date/Time    LDLCALC 73 08/02/2019 04:11 PM   . Pharmacist Clinical Goal(s): o Over the next 90 days, patient will work with PharmD and providers to achieve LDL goal < 100  . Current regimen:  o Rosuvastatin 20 mg daily  . Interventions: o Encouraged patient to resume water aerobics when tolerated/safe.  . Patient self care activities - Over the next 90 days, patient will: o Continue taking current medications.  o Reach out to provider or pharmacist with any questions/concerns.   Hyperthyroid . Pharmacist Clinical Goal(s) o Over the next 90 days, patient will work with PharmD and providers to reduce symptoms of hyperthyroidism.  . Current regimen:  o Methimazole 10 mg bid o Atenolol 50 mg bid  . Interventions: o Discussed endocrinologists recommendation to increase methimazole slowly to 40 mg daily. Patient is willing to slowly work up as tolerated.  . Patient self care activities - Over the next 90 days, patient will: o Work to reach  goal dose of 40 mg daily as tolerated.  o Follow-up with endocrinologist as scheduled.  o Contact provider or pharmacist with any questions or concerns.   Medication management . Pharmacist Clinical Goal(s): o Over the next 90 days, patient will work with PharmD and providers to achieve optimal medication adherence . Current pharmacy: [facility] . Interventions o Comprehensive medication review performed. o Continue current medication management strategy . Patient self care activities - Over the next 90 days, patient will: o Focus on medication adherence by continuing to keep medications in same area and taking as prescribed.  o Take medications as prescribed o Report any questions or concerns to PharmD and/or provider(s)  Initial goal documentation        Hypertension   Office blood pressures are  BP Readings from Last 3 Encounters:  08/15/19 (!) 150/82  05/24/19 (!) 195/88  05/24/19 132/86    Patient has failed these meds in the past:  irbesartan-hctz Patient is currently uncontrolled on the following medications:   Clonidine 0.1 mg twice daily  Valsartan 40 mg daily  Patient checks BP at home infrequently  Patient home BP readings are ranging: unsure - doesn't regularly check.   We discussed diet and exercise extensively. Tries to eat low acid food to avoid cough/acid reflux. Does not eat fried foods. Eats cereal, banana, vegetable plate, sandwich. Has lost 70 pounds of weight loss in the last year due to thyroid. Has been dealing with these symptoms greater than a year. Patient drinks a caramel frappe from [employer] occasionally. Mainly drinks water. Patient reports blood pressure is controlled at home when checked by her daughters. States bp is always elevated when in office. Patient indicates she can tell if bp is high. Denies missed doses.   Plan  Continue current medications   Hyperlipidemia   LDL goal < 100  Lipid Panel     Component Value Date/Time   CHOL 143 08/02/2019 1611   TRIG 72 08/02/2019 1611   HDL 56 08/02/2019 1611   LDLCALC 73 08/02/2019 1611    Hepatic Function Latest Ref Rng & Units 08/02/2019 05/17/2019  Total Protein 6.0 - 8.5 g/dL 7.3 7.4  Albumin 3.8 - 4.8 g/dL 4.0 4.0  AST 0 - 40 IU/L 15 16  ALT 0 - 32 IU/L 10 10  Alk Phosphatase 48 - 121 IU/L 149(H) 144(H)  Total Bilirubin 0.0 - 1.2 mg/dL 0.8 0.6     The 10-year ASCVD risk score Mikey Bussing DC Jr., et al., 2013) is: 8.2%   Values used to calculate the score:     Age: 4 years     Sex: Female     Is Non-Hispanic African American: Yes     Diabetic: No     Tobacco smoker: No     Systolic Blood Pressure: 500 mmHg     Is BP treated: Yes     HDL Cholesterol: 56 mg/dL     Total Cholesterol: 143 mg/dL   Patient has failed these meds in past: none reported Patient is currently controlled on the following medications:  . Rosuvastatin 20 mg daily  We discussed:  diet and exercise extensively. Has a [employer] membership but hasn't been due to  low energy and pandemic. She would like to begin water aerobics once she is feeling better from thyroid. Discussed benefit of continuing rosuvastatin despite good blood work results. Patient understands the benefit of preventing heart attack and stroke.   Plan  Continue current medications  Hyperthyroidism  Lab Results  Component Value Date/Time   TSH <0.005 (L) 08/02/2019 04:11 PM   TSH <0.005 (L) 06/10/2019 08:00 AM   FREET4 6.41 (H) 08/02/2019 04:11 PM   FREET4 4.50 (H) 06/10/2019 08:00 AM    Patient has failed these meds in past: levothyroxine for thyroid in the past Patient is currently uncontrolled on the following medications:  Marland Kitchen Methimazole 10 mg bid  . Atenolol 50 mg bid   We discussed: Discussed that endocrinologist has increased to methimazole dose to 40 mg. Patient had currently been taking 20 mg daily. We discussed slowly increasing to endocrinologist recommended dose. Patient has 10 mg dose at home and plan to begin slowly increasing and will call pharmacist or provider with any questions/concerns. She has been coughing a lot and occasionally throws up with cough. She had been concerned of losing her methimazole medication by vomiting.Patient is frustrated feeling so bad each day. She hope that the endocrinologist can do something more aggressive at her next visit.    Plan  Continue current medications and increase methimazole to 40 mg as tolerated.   GERD   Patient has failed these meds in past: pantoprazole Patient is currently uncontrolled on the following medications:  . Nexium 40 mg twice daily   We discussed:  Pantoprazole did not work for patient. She has a cough with clear mucus lately. She is worried about throwing up her medication. She has had GI test and scans to determine source. Dr. Tobie Poet has ordered her Nexium through [facility] to see if works better. Patient feels that generic form has not worked well lately.   Plan  Continue current  medications   Depression/Anxiety   Patient has failed these meds in past: trintellix Patient is currently uncontrolled on the following medications:  . n/a  We discussed:  Patient still has anxiety/depression but has stopped Trintellix due to nausea/vomiting. She plans to resume 10 mg daily therapy but trying to get control of nausea first. Pharmacist counseled patient on the importance of managing symptoms.  Patient reports that her 2 daughters take good care of her. Her grandson is the light of her life. She is thankful for family support in town. We discussed exercise improving symptoms of depression and anxiety. She has a membership to [employer] and hopes to do water aerobics. She has some concern about the pandemic which has kept her away. The fatigue associated with her thyroid makes it more difficult for her to exercise due to a lot of fatigue.   Plan  Continue current medications  Pain    Patient has failed these meds in past: n/a Patient is currently controlled on the following medications:  . Tramadol 50 mg as needed for pain  We discussed:  Patient reports that she only uses prn pain medications. She reports frequent pain of arms and legs which she attributes to her thyroid condition.   Plan  Continue current medications  Insomnia   Patient has failed these meds in past: n/a Patient is currently controlled on the following medications:  . Trazodone 50 mg - 1/2-1 tablet at bedtime prn  . Zolpidem CR 12.5 mg at bedtime prn   We discussed:  Patient is not currently using the trazodone for sleep. She takes zolipidem 12.5 mg nightly. She can't rest without the zolpidem.   Plan  Continue current medications   Osteopenia / Osteoporosis   Last DEXA Scan: no bone density test documented in chart but patient doesn't remember last scan date  No results found for: VD25OH   Patient needs an updated Dexa scan to determine.  Patient has failed these meds in past:  n/a Patient is currently uncontrolled on the following medications:  Marland Kitchen Vitamin D 1000 units three times weekly   We discussed:  Recommend [phone] units of vitamin D daily. Recommend 1200 mg of calcium daily from dietary and supplemental sources. Patient is concerned about her bone health after endocrinologist indicated that her hyperthyroidism could be harming her bone health. She thinks she has had a bone density test at some point in the past.   Plan  Continue current medications  Health Maintenance   CBC    Component Value Date/Time   WBC 5.9 08/02/2019 1611   WBC 5.5 05/24/2019 1936   RBC 3.94 08/02/2019 1611   RBC 4.00 05/24/2019 1936   HGB 9.4 (L) 08/02/2019 1611   HCT 29.8 (L) 08/02/2019 1611   PLT 234 08/02/2019 1611   MCV 76 (L) 08/02/2019 1611   MCH 23.9 (L) 08/02/2019 1611   MCH 24.0 (L) 05/24/2019 1936   MCHC 31.5 08/02/2019 1611   MCHC 31.1 05/24/2019 1936   RDW 14.8 08/02/2019 1611   LYMPHSABS 1.8 08/02/2019 1611   EOSABS 0.1 08/02/2019 1611   BASOSABS 0.0 08/02/2019 1611     Patient is currently controlled on the following medications:  . Ferrous sulfate 325 mg daily - anemia . Fexofenadine OTC daily allergies  We discussed:  Paitent received OTC medications through her [facility] plan.   Plan  Continue current medications  Vaccines   Reviewed and discussed patient's vaccination history. Shingles vaccine script given but patient has not received it. Discussed benefits of vaccine to protect from shingles.   Immunization History  Administered Date(s) Administered  . Moderna SARS-COVID-2 Vaccination 03/17/2019, 04/15/2019  . Pneumococcal Polysaccharide-23 10/25/2014    Plan  Recommended patient receive annual flu vaccine in office.   Medication Management   Pt uses [facility] for all medications Uses pill box? No - lines up on night stand. She eventually will use a pill box.  Pt endorses good compliance and denies missed doses.    We discussed: Patient has been a patient sitter in the past and understands the importance of taking her medication. She lines it all up on her night stand.   Plan  Continue current medication management strategy    Follow up: 2 month phone visit

## 2019-08-18 ENCOUNTER — Ambulatory Visit: Payer: Medicare HMO

## 2019-08-18 DIAGNOSIS — E782 Mixed hyperlipidemia: Secondary | ICD-10-CM

## 2019-08-18 DIAGNOSIS — E0590 Thyrotoxicosis, unspecified without thyrotoxic crisis or storm: Secondary | ICD-10-CM

## 2019-08-18 DIAGNOSIS — I10 Essential (primary) hypertension: Secondary | ICD-10-CM

## 2019-08-19 NOTE — Patient Instructions (Addendum)
Visit Information  Thank you for your time discussing your medications. I look forward to working with you to achieve your health care goals. Below is a summary of what we talked about during our visit.   Goals Addressed            This Visit's Progress   . Pharmacy Care Plan       CARE PLAN ENTRY (see longitudinal plan of care for additional care plan information)  Current Barriers:  . Chronic Disease Management support, education, and care coordination needs related to Hypertension, Hyperlipidemia, Depression, Hyperthyroidism and Anxiety   Hypertension BP Readings from Last 3 Encounters:  08/15/19 (!) 150/82  05/24/19 (!) 195/88  05/24/19 132/86   . Pharmacist Clinical Goal(s): o Over the next 90 days, patient will work with PharmD and providers to achieve BP goal <140/90 . Current regimen:  o Clonidine 0.1 mg twice daily o Valsartan 40 mg daily  . Interventions: o Recommended patient continue taking medication as prescribed.  . Patient self care activities - Over the next 90 days, patient will: o Check BP weekly, document, and provide at future appointments o Ensure daily salt intake < 2300 mg/day  Hyperlipidemia Lab Results  Component Value Date/Time   LDLCALC 73 08/02/2019 04:11 PM   . Pharmacist Clinical Goal(s): o Over the next 90 days, patient will work with PharmD and providers to achieve LDL goal < 100  . Current regimen:  o Rosuvastatin 20 mg daily  . Interventions: o Encouraged patient to resume water aerobics when tolerated/safe.  . Patient self care activities - Over the next 90 days, patient will: o Continue taking current medications.  o Reach out to provider or pharmacist with any questions/concerns.   Hyperthyroid . Pharmacist Clinical Goal(s) o Over the next 90 days, patient will work with PharmD and providers to reduce symptoms of hyperthyroidism.  . Current regimen:  o Methimazole 10 mg bid o Atenolol 50 mg bid  . Interventions: o Discussed  endocrinologists recommendation to increase methimazole slowly to 40 mg daily. Patient is willing to slowly work up as tolerated.  . Patient self care activities - Over the next 90 days, patient will: o Work to reach goal dose of 40 mg daily as tolerated.  o Follow-up with endocrinologist as scheduled.  o Contact provider or pharmacist with any questions or concerns.   Medication management . Pharmacist Clinical Goal(s): o Over the next 90 days, patient will work with PharmD and providers to achieve optimal medication adherence . Current pharmacy: [facility] . Interventions o Comprehensive medication review performed. o Continue current medication management strategy . Patient self care activities - Over the next 90 days, patient will: o Focus on medication adherence by continuing to keep medications in same area and taking as prescribed.  o Take medications as prescribed o Report any questions or concerns to PharmD and/or provider(s)  Initial goal documentation        Donna Mayer was given information about Chronic Care Management services today including:  1. CCM service includes personalized support from designated clinical staff supervised by her physician, including individualized plan of care and coordination with other care providers 2. 24/7 contact phone numbers for assistance for urgent and routine care needs. 3. Standard insurance, coinsurance, copays and deductibles apply for chronic care management only during months in which we provide at least 20 minutes of these services. Most insurances cover these services at 100%, however patients may be responsible for any copay, coinsurance and/or  deductible if applicable. This service may help you avoid the need for more expensive face-to-face services. 4. Only one practitioner may furnish and bill the service in a calendar month. 5. The patient may stop CCM services at any time (effective at the end of the month) by phone call  to the office staff.  Patient agreed to services and verbal consent obtained.   The patient verbalized understanding of instructions provided today and agreed to receive a mailed copy of patient instruction and/or educational materials. Telephone follow up appointment with pharmacy team member scheduled for: October 2021  Sherre Poot, PharmD Clinical Pharmacist [place] [phone] (office) [phone] (mobile)  Hyperthyroidism  Hyperthyroidism is when the thyroid gland is too active (overactive). The thyroid gland is a small gland located in the lower front part of the neck, just in front of the windpipe (trachea). This gland makes hormones that help control how the body uses food for energy (metabolism) as well as how the heart and brain function. These hormones also play a role in keeping your bones strong. When the thyroid is overactive, it produces too much of a hormone called thyroxine. What are the causes? This condition may be caused by:  Graves' disease. This is a disorder in which the body's disease-fighting system (immune system) attacks the thyroid gland. This is the most common cause.  Inflammation of the thyroid gland.  A tumor in the thyroid gland.  Use of certain medicines, including: ? Prescription thyroid hormone replacement. ? Herbal supplements that mimic thyroid hormones. ? Amiodarone therapy.  Solid or fluid-filled lumps within your thyroid gland (thyroid nodules).  Taking in a large amount of iodine from foods or medicines. What increases the risk? You are more likely to develop this condition if:  You are female.  You have a family history of thyroid conditions.  You smoke tobacco.  You use a medicine called lithium.  You take medicines that affect the immune system (immunosuppressants). What are the signs or symptoms? Symptoms of this condition include:  Nervousness.  Inability to tolerate heat.  Unexplained weight  loss.  Diarrhea.  Change in the texture of hair or skin.  Heart skipping beats or making extra beats.  Rapid heart rate.  Loss of menstruation.  Shaky hands.  Fatigue.  Restlessness.  Sleep problems.  Enlarged thyroid gland or a lump in the thyroid (nodule). You may also have symptoms of Graves' disease, which may include:  Protruding eyes.  Dry eyes.  Red or swollen eyes.  Problems with vision. How is this diagnosed? This condition may be diagnosed based on:  Your symptoms and medical history.  A physical exam.  Blood tests.  Thyroid ultrasound. This test involves using sound waves to produce images of the thyroid gland.  A thyroid scan. A radioactive substance is injected into a vein, and images show how much iodine is present in the thyroid.  Radioactive iodine uptake test (RAIU). A small amount of radioactive iodine is given by mouth to see how much iodine the thyroid absorbs after a certain amount of time. How is this treated? Treatment depends on the cause and severity of the condition. Treatment may include:  Medicines to reduce the amount of thyroid hormone your body makes.  Radioactive iodine treatment (radioiodine therapy). This involves swallowing a small dose of radioactive iodine, in capsule or liquid form, to kill thyroid cells.  Surgery to remove part or all of your thyroid gland. You may need to take thyroid hormone replacement medicine  for the rest of your life after thyroid surgery.  Medicines to help manage your symptoms. Follow these instructions at home:   Take over-the-counter and prescription medicines only as told by your health care provider.  Do not use any products that contain nicotine or tobacco, such as cigarettes and e-cigarettes. If you need help quitting, ask your health care provider.  Follow any instructions from your health care provider about diet. You may be instructed to limit foods that contain iodine.  Keep all  follow-up visits as told by your health care provider. This is important. ? You will need to have blood tests regularly so that your health care provider can monitor your condition. Contact a health care provider if:  Your symptoms do not get better with treatment.  You have a fever.  You are taking thyroid hormone replacement medicine and you: ? Have symptoms of depression. ? Feel like you are tired all the time. ? Gain weight. Get help right away if:  You have chest pain.  You have decreased alertness or a change in your awareness.  You have abdominal pain.  You feel dizzy.  You have a rapid heartbeat.  You have an irregular heartbeat.  You have difficulty breathing. Summary  The thyroid gland is a small gland located in the lower front part of the neck, just in front of the windpipe (trachea).  Hyperthyroidism is when the thyroid gland is too active (overactive) and produces too much of a hormone called thyroxine.  The most common cause is Graves' disease, a disorder in which your immune system attacks the thyroid gland.  Hyperthyroidism can cause various symptoms, such as unexplained weight loss, nervousness, inability to tolerate heat, or changes in your heartbeat.  Treatment may include medicine to reduce the amount of thyroid hormone your body makes, radioiodine therapy, surgery, or medicines to manage symptoms. This information is not intended to replace advice given to you by your health care provider. Make sure you discuss any questions you have with your health care provider. Document Revised: 12/12/2016 Document Reviewed: 12/10/2016 Elsevier Patient Education  [place].  Hyperthyroidism  Hyperthyroidism is when the thyroid gland is too active (overactive). The thyroid gland is a small gland located in the lower front part of the neck, just in front of the windpipe (trachea). This gland makes hormones that help control how the body uses food for energy  (metabolism) as well as how the heart and brain function. These hormones also play a role in keeping your bones strong. When the thyroid is overactive, it produces too much of a hormone called thyroxine. What are the causes? This condition may be caused by:  Graves' disease. This is a disorder in which the body's disease-fighting system (immune system) attacks the thyroid gland. This is the most common cause.  Inflammation of the thyroid gland.  A tumor in the thyroid gland.  Use of certain medicines, including: ? Prescription thyroid hormone replacement. ? Herbal supplements that mimic thyroid hormones. ? Amiodarone therapy.  Solid or fluid-filled lumps within your thyroid gland (thyroid nodules).  Taking in a large amount of iodine from foods or medicines. What increases the risk? You are more likely to develop this condition if:  You are female.  You have a family history of thyroid conditions.  You smoke tobacco.  You use a medicine called lithium.  You take medicines that affect the immune system (immunosuppressants). What are the signs or symptoms? Symptoms of this condition include:  Nervousness.  Inability to tolerate heat.  Unexplained weight loss.  Diarrhea.  Change in the texture of hair or skin.  Heart skipping beats or making extra beats.  Rapid heart rate.  Loss of menstruation.  Shaky hands.  Fatigue.  Restlessness.  Sleep problems.  Enlarged thyroid gland or a lump in the thyroid (nodule). You may also have symptoms of Graves' disease, which may include:  Protruding eyes.  Dry eyes.  Red or swollen eyes.  Problems with vision. How is this diagnosed? This condition may be diagnosed based on:  Your symptoms and medical history.  A physical exam.  Blood tests.  Thyroid ultrasound. This test involves using sound waves to produce images of the thyroid gland.  A thyroid scan. A radioactive substance is injected into a vein, and  images show how much iodine is present in the thyroid.  Radioactive iodine uptake test (RAIU). A small amount of radioactive iodine is given by mouth to see how much iodine the thyroid absorbs after a certain amount of time. How is this treated? Treatment depends on the cause and severity of the condition. Treatment may include:  Medicines to reduce the amount of thyroid hormone your body makes.  Radioactive iodine treatment (radioiodine therapy). This involves swallowing a small dose of radioactive iodine, in capsule or liquid form, to kill thyroid cells.  Surgery to remove part or all of your thyroid gland. You may need to take thyroid hormone replacement medicine for the rest of your life after thyroid surgery.  Medicines to help manage your symptoms. Follow these instructions at home:   Take over-the-counter and prescription medicines only as told by your health care provider.  Do not use any products that contain nicotine or tobacco, such as cigarettes and e-cigarettes. If you need help quitting, ask your health care provider.  Follow any instructions from your health care provider about diet. You may be instructed to limit foods that contain iodine.  Keep all follow-up visits as told by your health care provider. This is important. ? You will need to have blood tests regularly so that your health care provider can monitor your condition. Contact a health care provider if:  Your symptoms do not get better with treatment.  You have a fever.  You are taking thyroid hormone replacement medicine and you: ? Have symptoms of depression. ? Feel like you are tired all the time. ? Gain weight. Get help right away if:  You have chest pain.  You have decreased alertness or a change in your awareness.  You have abdominal pain.  You feel dizzy.  You have a rapid heartbeat.  You have an irregular heartbeat.  You have difficulty breathing. Summary  The thyroid gland is a  small gland located in the lower front part of the neck, just in front of the windpipe (trachea).  Hyperthyroidism is when the thyroid gland is too active (overactive) and produces too much of a hormone called thyroxine.  The most common cause is Graves' disease, a disorder in which your immune system attacks the thyroid gland.  Hyperthyroidism can cause various symptoms, such as unexplained weight loss, nervousness, inability to tolerate heat, or changes in your heartbeat.  Treatment may include medicine to reduce the amount of thyroid hormone your body makes, radioiodine therapy, surgery, or medicines to manage symptoms. This information is not intended to replace advice given to you by your health care provider. Make sure you discuss any questions you have with your health care provider. Document Revised:  12/12/2016 Document Reviewed: 12/10/2016 Elsevier Patient Education  [place].

## 2019-08-24 ENCOUNTER — Other Ambulatory Visit: Payer: Self-pay | Admitting: Physician Assistant

## 2019-08-29 NOTE — Progress Notes (Signed)
Patient is supposed to have come in for thyroid panel last week. What is there indication for a dexa in a 64 yo AAF except perhaps her hyperthyroidism (poorly controlled.) kc

## 2019-08-30 ENCOUNTER — Other Ambulatory Visit: Payer: Self-pay | Admitting: Family Medicine

## 2019-09-30 DIAGNOSIS — E0500 Thyrotoxicosis with diffuse goiter without thyrotoxic crisis or storm: Secondary | ICD-10-CM | POA: Diagnosis not present

## 2019-09-30 DIAGNOSIS — R0602 Shortness of breath: Secondary | ICD-10-CM | POA: Diagnosis not present

## 2019-09-30 DIAGNOSIS — E0590 Thyrotoxicosis, unspecified without thyrotoxic crisis or storm: Secondary | ICD-10-CM | POA: Diagnosis not present

## 2019-09-30 DIAGNOSIS — E041 Nontoxic single thyroid nodule: Secondary | ICD-10-CM | POA: Diagnosis not present

## 2019-10-03 ENCOUNTER — Other Ambulatory Visit: Payer: Self-pay | Admitting: Family Medicine

## 2019-10-06 DIAGNOSIS — E0500 Thyrotoxicosis with diffuse goiter without thyrotoxic crisis or storm: Secondary | ICD-10-CM

## 2019-10-06 DIAGNOSIS — E041 Nontoxic single thyroid nodule: Secondary | ICD-10-CM | POA: Insufficient documentation

## 2019-10-06 HISTORY — DX: Nontoxic single thyroid nodule: E04.1

## 2019-10-06 HISTORY — DX: Thyrotoxicosis with diffuse goiter without thyrotoxic crisis or storm: E05.00

## 2019-10-19 NOTE — Chronic Care Management (AMB) (Deleted)
Chronic Care Management Pharmacy  Name: Donna Mayer  MRN: [id number] DOB: 1955/11/13  Chief Complaint/ HPI  Donna Mayer,  64 y.o. , female presents for their Initial CCM visit with the clinical pharmacist via telephone due to COVID-19 Pandemic.  PCP : [name], MD  Their chronic conditions include: hypertension, GERD, hyperthyroidism, graves disease, anxiety, hyperlipidemia, depression.   Office Visits:   08/15/2019 - continue current medications.   05/24/2019 - patient having nausea/vomiting with medciation. BP elevated at hom 170-190/80/90. Vomiting daily. Reduced Trintellix to 10 mg and methomazole 15 mg tid.   05/17/2019 - Increase methimazole to 20 mg tid, Increase Trintellix 20 mg once daily. Increase atenolol50 mg bid for palpitations.   05/13/2019 - Patient is still anemic. Please see how much iron sulfate she is taking. Double it. Kidney function normal. Liver function normal. Tsh, T4 and T3 all still very abnormal. Increase methimazole to 15 mg one three times a day. Recheck in 6 weeks. The increase in atenolol should make her feel better while the methimazole is getting in her system.   04/26/2019 - Added trazodone to zopidem. Anemia is stable. Liver and kidney function stable. Cholesterol well controlled. A1c not done. Pt is prediabetic and has been well controlled. Patient's thyroid is still very abnormal. I will forward this to Dr. Janese Banks, her endocrinologist.   Consult Visit:   09/30/2019 - scheduled for referral for surgical intervention.   06/30/2019 - Increase methimazole slowly to 40 mg/day. Alk phosphatase increased to to increased bone turnover from hyperthyroidism with normal AST, ALT and Tbili. Anemia per CBC.   06/29/2019 - Endocrinology - continue methimazole 10 mg tid and atenolol 50 mg bid. Adjust based on TFTs.   05/24/2019 [facility] - Patient left ED after triage.   04/21/2019 -Endocrinology -  Reviewed recent thyroid labs with patient.  Which suggest Graves' disease. Currently taking MMI 10 mg TID. Dose recently adjusted. Plan to continue MMI 10 mg TID for now and repeat TFT in 4 weeks. Patient has lab apt 05/13/19 to have TFT completed and she will fax to our office for review. Free t4 has slightly improved.   04/12/2019 - Endocrinology - labs and ultrasound ordered. Continue methimazole treatment.   Medications: Outpatient Encounter Medications as of 10/20/2019  Medication Sig  . atenolol (TENORMIN) 50 MG tablet TAKE 1 TABLET(50 MG) BY MOUTH TWICE DAILY  . cholecalciferol (VITAMIN D3) 25 MCG (1000 UNIT) tablet Take 1,000 Units by mouth 3 (three) times a week.  . cloNIDine (CATAPRES) 0.1 MG tablet TAKE 1 TABLET TWICE DAILY  . ferrous sulfate 325 (65 FE) MG tablet Take 325 mg by mouth daily with breakfast.  . Fexofenadine HCl (ALLEGRA PO) Take 1 tablet by mouth daily.  . methimazole (TAPAZOLE) 10 MG tablet TAKE 2 TABLETS(20 MG) BY MOUTH THREE TIMES DAILY (Patient taking differently: Take 10 mg by mouth 2 (two) times daily. )  . NEXIUM 40 MG capsule Take twice a day by mouth.  . rosuvastatin (CRESTOR) 20 MG tablet Take 20 mg by mouth daily.  . traMADol (ULTRAM) 50 MG tablet   . valsartan (DIOVAN) 40 MG tablet Take 40 mg by mouth daily.  Marland Kitchen zolpidem (AMBIEN CR) 12.5 MG CR tablet TAKE 1 TABLET(12.5 MG) BY MOUTH AT BEDTIME AS NEEDED FOR SLEEP   Facility-Administered Encounter Medications as of 10/20/2019  Medication  . 0.9 %  sodium chloride infusion   No Known Allergies  SDOH Screenings   Alcohol Screen:   .  Last Alcohol Screening Score (AUDIT): Not on file  Depression (PHQ2-9): Medium Risk  . PHQ-2 Score: 22  Financial Resource Strain:   . Difficulty of Paying Living Expenses: Not on file  Food Insecurity: No Food Insecurity  . Worried About Charity fundraiser in the Last Year: Never true  . Ran Out of Food in the Last Year: Never true  Housing: Low Risk   . Last Housing Risk Score: 0  Physical Activity:   . Days  of Exercise per Week: Not on file  . Minutes of Exercise per Session: Not on file  Social Connections:   . Frequency of Communication with Friends and Family: Not on file  . Frequency of Social Gatherings with Friends and Family: Not on file  . Attends Religious Services: Not on file  . Active Member of Clubs or Organizations: Not on file  . Attends Archivist Meetings: Not on file  . Marital Status: Not on file  Stress:   . Feeling of Stress : Not on file  Tobacco Use: Low Risk   . Smoking Tobacco Use: Never Smoker  . Smokeless Tobacco Use: Never Used  Transportation Needs: No Transportation Needs  . Lack of Transportation (Medical): No  . Lack of Transportation (Non-Medical): No     Current Diagnosis/Assessment:  Goals Addressed   None     Hypertension   Office blood pressures are  BP Readings from Last 3 Encounters:  08/15/19 (!) 150/82  05/24/19 (!) 195/88  05/24/19 132/86    Patient has failed these meds in the past: irbesartan-hctz Patient is currently uncontrolled on the following medications:   Clonidine 0.1 mg twice daily  Valsartan 40 mg daily  Patient checks BP at home infrequently  Patient home BP readings are ranging: unsure - doesn't regularly check.   We discussed diet and exercise extensively. Tries to eat low acid food to avoid cough/acid reflux. Does not eat fried foods. Eats cereal, banana, vegetable plate, sandwich. Has lost 70 pounds of weight loss in the last year due to thyroid. Has been dealing with these symptoms greater than a year. Patient drinks a caramel frappe from [employer] occasionally. Mainly drinks water. Patient reports blood pressure is controlled at home when checked by her daughters. States bp is always elevated when in office. Patient indicates she can tell if bp is high. Denies missed doses.   Plan  Continue current medications   Hyperlipidemia   LDL goal < 100  Lipid Panel     Component Value Date/Time    CHOL 143 08/02/2019 1611   TRIG 72 08/02/2019 1611   HDL 56 08/02/2019 1611   LDLCALC 73 08/02/2019 1611    Hepatic Function Latest Ref Rng & Units 08/02/2019 05/17/2019  Total Protein 6.0 - 8.5 g/dL 7.3 7.4  Albumin 3.8 - 4.8 g/dL 4.0 4.0  AST 0 - 40 IU/L 15 16  ALT 0 - 32 IU/L 10 10  Alk Phosphatase 48 - 121 IU/L 149(H) 144(H)  Total Bilirubin 0.0 - 1.2 mg/dL 0.8 0.6     The 10-year ASCVD risk score Mikey Bussing DC Jr., et al., 2013) is: 9.8%   Values used to calculate the score:     Age: 67 years     Sex: Female     Is Non-Hispanic African American: Yes     Diabetic: No     Tobacco smoker: No     Systolic Blood Pressure: 294 mmHg     Is BP treated: Yes  HDL Cholesterol: 56 mg/dL     Total Cholesterol: 143 mg/dL   Patient has failed these meds in past: none reported Patient is currently controlled on the following medications:  . Rosuvastatin 20 mg daily  We discussed:  diet and exercise extensively. Has a [employer] membership but hasn't been due to low energy and pandemic. She would like to begin water aerobics once she is feeling better from thyroid. Discussed benefit of continuing rosuvastatin despite good blood work results. Patient understands the benefit of preventing heart attack and stroke.   Plan  Continue current medications  Hyperthyroidism   Lab Results  Component Value Date/Time   TSH <0.005 (L) 08/02/2019 04:11 PM   TSH <0.005 (L) 06/10/2019 08:00 AM   FREET4 6.41 (H) 08/02/2019 04:11 PM   FREET4 4.50 (H) 06/10/2019 08:00 AM    Patient has failed these meds in past: levothyroxine for thyroid in the past Patient is currently uncontrolled on the following medications:  Marland Kitchen Methimazole 10 mg bid  . Atenolol 50 mg bid   We discussed: Discussed that endocrinologist has increased to methimazole dose to 40 mg. Patient had currently been taking 20 mg daily. We discussed slowly increasing to endocrinologist recommended dose. Patient has 10 mg dose at home and plan to begin  slowly increasing and will call pharmacist or provider with any questions/concerns. She has been coughing a lot and occasionally throws up with cough. She had been concerned of losing her methimazole medication by vomiting.Patient is frustrated feeling so bad each day. She hope that the endocrinologist can do something more aggressive at her next visit.    Plan  Continue current medications and increase methimazole to 40 mg as tolerated.   GERD   Patient has failed these meds in past: pantoprazole Patient is currently uncontrolled on the following medications:  . Nexium 40 mg twice daily   We discussed:  Pantoprazole did not work for patient. She has a cough with clear mucus lately. She is worried about throwing up her medication. She has had GI test and scans to determine source. Dr. Tobie Poet has ordered her Nexium through [facility] to see if works better. Patient feels that generic form has not worked well lately.   Plan  Continue current medications   Depression/Anxiety   Patient has failed these meds in past: trintellix Patient is currently uncontrolled on the following medications:  . n/a  We discussed:  Patient still has anxiety/depression but has stopped Trintellix due to nausea/vomiting. She plans to resume 10 mg daily therapy but trying to get control of nausea first. Pharmacist counseled patient on the importance of managing symptoms.  Patient reports that her 2 daughters take good care of her. Her grandson is the light of her life. She is thankful for family support in town. We discussed exercise improving symptoms of depression and anxiety. She has a membership to [employer] and hopes to do water aerobics. She has some concern about the pandemic which has kept her away. The fatigue associated with her thyroid makes it more difficult for her to exercise due to a lot of fatigue.   Plan  Continue current medications  Pain    Patient has failed these meds in past: n/a Patient is  currently controlled on the following medications:  . Tramadol 50 mg as needed for pain  We discussed:  Patient reports that she only uses prn pain medications. She reports frequent pain of arms and legs which she attributes to her thyroid condition.  Plan  Continue current medications  Insomnia   Patient has failed these meds in past: n/a Patient is currently controlled on the following medications:  . Trazodone 50 mg - 1/2-1 tablet at bedtime prn  . Zolpidem CR 12.5 mg at bedtime prn   We discussed:  Patient is not currently using the trazodone for sleep. She takes zolipidem 12.5 mg nightly. She can't rest without the zolpidem.   Plan  Continue current medications   Osteopenia / Osteoporosis   Last DEXA Scan: no bone density test documented in chart but patient doesn't remember last scan date   No results found for: VD25OH   Patient needs an updated Dexa scan to determine.  Patient has failed these meds in past: n/a Patient is currently uncontrolled on the following medications:  Marland Kitchen Vitamin D 1000 units three times weekly   We discussed:  Recommend [phone] units of vitamin D daily. Recommend 1200 mg of calcium daily from dietary and supplemental sources. Patient is concerned about her bone health after endocrinologist indicated that her hyperthyroidism could be harming her bone health. She thinks she has had a bone density test at some point in the past.   Plan  Continue current medications  Health Maintenance   CBC    Component Value Date/Time   WBC 5.9 08/02/2019 1611   WBC 5.5 05/24/2019 1936   RBC 3.94 08/02/2019 1611   RBC 4.00 05/24/2019 1936   HGB 9.4 (L) 08/02/2019 1611   HCT 29.8 (L) 08/02/2019 1611   PLT 234 08/02/2019 1611   MCV 76 (L) 08/02/2019 1611   MCH 23.9 (L) 08/02/2019 1611   MCH 24.0 (L) 05/24/2019 1936   MCHC 31.5 08/02/2019 1611   MCHC 31.1 05/24/2019 1936   RDW 14.8 08/02/2019 1611   LYMPHSABS 1.8 08/02/2019 1611   EOSABS 0.1  08/02/2019 1611   BASOSABS 0.0 08/02/2019 1611     Patient is currently controlled on the following medications:  . Ferrous sulfate 325 mg daily - anemia . Fexofenadine OTC daily allergies  We discussed:  Paitent received OTC medications through her [facility] plan.   Plan  Continue current medications  Vaccines   Reviewed and discussed patient's vaccination history. Shingles vaccine script given but patient has not received it. Discussed benefits of vaccine to protect from shingles.   Immunization History  Administered Date(s) Administered  . Moderna SARS-COVID-2 Vaccination 03/17/2019, 04/15/2019  . Pneumococcal Polysaccharide-23 10/25/2014    Plan  Recommended patient receive annual flu vaccine in office.   Medication Management   Pt uses [facility] for all medications Uses pill box? No - lines up on night stand. She eventually will use a pill box.  Pt endorses good compliance and denies missed doses.   We discussed: Patient has been a patient sitter in the past and understands the importance of taking her medication. She lines it all up on her night stand.   Plan  Continue current medication management strategy    Follow up: 2 month phone visit

## 2019-10-20 ENCOUNTER — Telehealth: Payer: Medicare HMO

## 2019-10-27 DIAGNOSIS — E0500 Thyrotoxicosis with diffuse goiter without thyrotoxic crisis or storm: Secondary | ICD-10-CM | POA: Diagnosis not present

## 2019-10-27 DIAGNOSIS — E0590 Thyrotoxicosis, unspecified without thyrotoxic crisis or storm: Secondary | ICD-10-CM | POA: Diagnosis not present

## 2019-11-02 ENCOUNTER — Telehealth: Payer: Self-pay | Admitting: Family Medicine

## 2019-11-02 ENCOUNTER — Ambulatory Visit: Payer: Medicare HMO

## 2019-11-02 ENCOUNTER — Other Ambulatory Visit: Payer: Self-pay

## 2019-11-02 ENCOUNTER — Other Ambulatory Visit: Payer: Self-pay | Admitting: Family Medicine

## 2019-11-02 DIAGNOSIS — I10 Essential (primary) hypertension: Secondary | ICD-10-CM

## 2019-11-02 DIAGNOSIS — E782 Mixed hyperlipidemia: Secondary | ICD-10-CM

## 2019-11-02 NOTE — Chronic Care Management (AMB) (Signed)
Chronic Care Management Pharmacy  Name: Donna Mayer  MRN: [id number] DOB: 04-Jan-1956  Chief Complaint/ HPI  Donna Mayer,  64 y.o. , female presents for their Follow-Up CCM visit with the clinical pharmacist via telephone due to COVID-19 Pandemic.  PCP : [name], MD  Their chronic conditions include: hypertension, GERD, hyperthyroidism, graves disease, anxiety, hyperlipidemia, depression.   Office Visits:   08/15/2019 - continue current medications.   Consult Visit:   10/27/2019 - ENT - patient being scheduled for thyroid removal.  09/30/2019 - scheduled for referral for surgical intervention.   Medications: Outpatient Encounter Medications as of 11/02/2019  Medication Sig  . atenolol (TENORMIN) 50 MG tablet TAKE 1 TABLET(50 MG) BY MOUTH TWICE DAILY  . methimazole (TAPAZOLE) 10 MG tablet TAKE 2 TABLETS(20 MG) BY MOUTH THREE TIMES DAILY (Patient taking differently: Take 10 mg by mouth 2 (two) times daily. )  . valsartan (DIOVAN) 40 MG tablet Take 40 mg by mouth daily.  Marland Kitchen vortioxetine HBr (TRINTELLIX) 10 MG TABS tablet Take 10 mg by mouth daily.  . cholecalciferol (VITAMIN D3) 25 MCG (1000 UNIT) tablet Take 1,000 Units by mouth 3 (three) times a week.  . cloNIDine (CATAPRES) 0.1 MG tablet TAKE 1 TABLET TWICE DAILY  . ferrous sulfate 325 (65 FE) MG tablet Take 325 mg by mouth daily with breakfast.  . Fexofenadine HCl (ALLEGRA PO) Take 1 tablet by mouth daily.  Marland Kitchen NEXIUM 40 MG capsule Take twice a day by mouth.  . rosuvastatin (CRESTOR) 20 MG tablet Take 20 mg by mouth daily.  . traMADol (ULTRAM) 50 MG tablet   . zolpidem (AMBIEN CR) 12.5 MG CR tablet TAKE 1 TABLET(12.5 MG) BY MOUTH AT BEDTIME AS NEEDED FOR SLEEP   Facility-Administered Encounter Medications as of 11/02/2019  Medication  . 0.9 %  sodium chloride infusion   No Known Allergies  SDOH Screenings   Alcohol Screen:   . Last Alcohol Screening Score (AUDIT): Not on file  Depression (PHQ2-9): Medium  Risk  . PHQ-2 Score: 22  Financial Resource Strain:   . Difficulty of Paying Living Expenses: Not on file  Food Insecurity: No Food Insecurity  . Worried About Charity fundraiser in the Last Year: Never true  . Ran Out of Food in the Last Year: Never true  Housing: Low Risk   . Last Housing Risk Score: 0  Physical Activity:   . Days of Exercise per Week: Not on file  . Minutes of Exercise per Session: Not on file  Social Connections:   . Frequency of Communication with Friends and Family: Not on file  . Frequency of Social Gatherings with Friends and Family: Not on file  . Attends Religious Services: Not on file  . Active Member of Clubs or Organizations: Not on file  . Attends Archivist Meetings: Not on file  . Marital Status: Not on file  Stress:   . Feeling of Stress : Not on file  Tobacco Use: Low Risk   . Smoking Tobacco Use: Never Smoker  . Smokeless Tobacco Use: Never Used  Transportation Needs: No Transportation Needs  . Lack of Transportation (Medical): No  . Lack of Transportation (Non-Medical): No     Current Diagnosis/Assessment:  Goals Addressed            This Visit's Progress   . Pharmacy Care Plan       CARE PLAN ENTRY (see longitudinal plan of care for additional care plan information)  Current  Barriers:  . Chronic Disease Management support, education, and care coordination needs related to Hypertension, Hyperlipidemia, Depression, Hyperthyroidism and Anxiety   Hypertension BP Readings from Last 3 Encounters:  08/15/19 (!) 150/82  05/24/19 (!) 195/88  05/24/19 132/86   . Pharmacist Clinical Goal(s): o Over the next 90 days, patient will work with PharmD and providers to achieve BP goal <140/90 . Current regimen:  o Clonidine 0.1 mg twice daily o Valsartan 40 mg daily  . Interventions: o Recommended patient continue taking medication as prescribed.  o Patient is out of valsartan. Pharmacist coordianting refill with [employer].   . Patient self care activities - Over the next 90 days, patient will: o Check BP weekly, document, and provide at future appointments o Ensure daily salt intake < 2300 mg/day  Hyperlipidemia Lab Results  Component Value Date/Time   LDLCALC 73 08/02/2019 04:11 PM   . Pharmacist Clinical Goal(s): o Over the next 90 days, patient will work with PharmD and providers to maintain LDL goal < 100  . Current regimen:  o Rosuvastatin 20 mg daily  . Interventions: o Encouraged patient to resume water aerobics when tolerated/safe.  . Patient self care activities - Over the next 90 days, patient will: o Continue taking current medications.  o Reach out to provider or pharmacist with any questions/concerns.   Hyperthyroid . Pharmacist Clinical Goal(s) o Over the next 90 days, patient will work with PharmD and providers to reduce symptoms of hyperthyroidism.  . Current regimen:  o Methimazole 10 mg bid o Atenolol 50 mg bid  . Interventions: o Discussed upcoming thyroidectomy in November.  . Patient self care activities - Over the next 90 days, patient will: o Follow-up with endocrinologist as scheduled.  o Contact provider or pharmacist with any questions or concerns.   Medication management . Pharmacist Clinical Goal(s): o Over the next 90 days, patient will work with PharmD and providers to achieve optimal medication adherence . Current pharmacy: [facility] . Interventions o Comprehensive medication review performed. o Continue current medication management strategy . Patient self care activities - Over the next 90 days, patient will: o Focus on medication adherence by continuing to keep medications in same area and taking as prescribed.  o Take medications as prescribed o Report any questions or concerns to PharmD and/or provider(s)  Please see past updates related to this goal by clicking on the "Past Updates" button in the selected goal         Hypertension   Office  blood pressures are  BP Readings from Last 3 Encounters:  08/15/19 (!) 150/82  05/24/19 (!) 195/88  05/24/19 132/86    Patient has failed these meds in the past: irbesartan-hctz Patient is currently uncontrolled on the following medications:   Clonidine 0.1 mg twice daily  Valsartan 40 mg daily  Patient checks BP at home infrequently  Patient home BP readings are ranging: unsure - doesn't regularly check.   We discussed diet and exercise extensively. Blood pressure is fluctuating a lot right now until thyroid is removed. Patient reports she is currently out of valsartan 40 mg. Pharmacist reaching out to [facility] for delivery.   Patient is out of refills for valsartan. Pharmacist requested from nursing staff.   Plan  Continue current medications   Hyperlipidemia   LDL goal < 100  Lipid Panel     Component Value Date/Time   CHOL 143 08/02/2019 1611   TRIG 72 08/02/2019 1611   HDL 56 08/02/2019 1611  LDLCALC 73 08/02/2019 1611    Hepatic Function Latest Ref Rng & Units 08/02/2019 05/17/2019  Total Protein 6.0 - 8.5 g/dL 7.3 7.4  Albumin 3.8 - 4.8 g/dL 4.0 4.0  AST 0 - 40 IU/L 15 16  ALT 0 - 32 IU/L 10 10  Alk Phosphatase 48 - 121 IU/L 149(H) 144(H)  Total Bilirubin 0.0 - 1.2 mg/dL 0.8 0.6     The 10-year ASCVD risk score Mikey Bussing DC Jr., et al., 2013) is: 9.8%   Values used to calculate the score:     Age: 7 years     Sex: Female     Is Non-Hispanic African American: Yes     Diabetic: No     Tobacco smoker: No     Systolic Blood Pressure: 914 mmHg     Is BP treated: Yes     HDL Cholesterol: 56 mg/dL     Total Cholesterol: 143 mg/dL   Patient has failed these meds in past: none reported Patient is currently controlled on the following medications:  . Rosuvastatin 20 mg daily  We discussed:  diet and exercise extensively. Patient is experiencing shortness of breath and exercise intolerance due to thyroid. Unable to exercise.   Plan  Continue current  medications  Hyperthyroidism   Lab Results  Component Value Date/Time   TSH <0.005 (L) 08/02/2019 04:11 PM   TSH <0.005 (L) 06/10/2019 08:00 AM   FREET4 6.41 (H) 08/02/2019 04:11 PM   FREET4 4.50 (H) 06/10/2019 08:00 AM    Patient has failed these meds in past: levothyroxine for thyroid in the past Patient is currently uncontrolled on the following medications:  Marland Kitchen Methimazole 10 mg bid  . Atenolol 50 mg bid   We discussed: Patient has only been able to tolerate methimazole 10 mg bid. States she met with ENT and is proceeding with thyroidectomy. Hopes to have scheduled for November.    Plan  Continue current medications   GERD   Patient has failed these meds in past: pantoprazole Patient is currently uncontrolled on the following medications:  . Nexium 40 mg twice daily   We discussed:  Pantoprazole did not work for patient. Patient reports that cough and nausea are continuing likely due to swollen thyroid.Patient indicates she is out of nexium and [employer] never sent her medication.    [employer] states that Nexium requires prior authorization and requires pantoprazole or omeprazole first. Patient has tried and failed pantoprazole. Pharmacist working to coordinate prior authorization or appropriate substitution. .   Plan  Continue current medications   Depression/Anxiety   Patient has failed these meds in past: trintellix Patient is currently uncontrolled on the following medications:  . Trintellix 10 mg daily   We discussed: Patient is tolerating 10 mg daily. States she is very anxious which she attributes to her hyperthyroidism. She is ready to feel better post thyroidectomy.   Plan  Continue current medications  Pain    Patient has failed these meds in past: n/a Patient is currently controlled on the following medications:  . Tramadol 50 mg as needed for pain  We discussed:  Patient reports that she only uses prn pain medications.    Plan  Continue current  medications  Insomnia   Patient has failed these meds in past: n/a Patient is currently controlled on the following medications:  . Trazodone 50 mg - 1/2-1 tablet at bedtime prn  . Zolpidem CR 12.5 mg at bedtime prn   We discussed:  She can sleep sitting  up if she takes her zolpidem.   Plan  Continue current medications   Health Maintenance   CBC    Component Value Date/Time   WBC 5.9 08/02/2019 1611   WBC 5.5 05/24/2019 1936   RBC 3.94 08/02/2019 1611   RBC 4.00 05/24/2019 1936   HGB 9.4 (L) 08/02/2019 1611   HCT 29.8 (L) 08/02/2019 1611   PLT 234 08/02/2019 1611   MCV 76 (L) 08/02/2019 1611   MCH 23.9 (L) 08/02/2019 1611   MCH 24.0 (L) 05/24/2019 1936   MCHC 31.5 08/02/2019 1611   MCHC 31.1 05/24/2019 1936   RDW 14.8 08/02/2019 1611   LYMPHSABS 1.8 08/02/2019 1611   EOSABS 0.1 08/02/2019 1611   BASOSABS 0.0 08/02/2019 1611     Patient is currently controlled on the following medications:  . Ferrous sulfate 325 mg daily - anemia . Fexofenadine OTC daily allergies  We discussed:  Paitent received OTC medications through her [facility] plan.   Plan  Continue current medications  Vaccines   Reviewed and discussed patient's vaccination history. Shingles vaccine script given but patient has not received it. Discussed benefits of vaccine to protect from shingles.   Immunization History  Administered Date(s) Administered  . Moderna SARS-COVID-2 Vaccination 03/17/2019, 04/15/2019  . Pneumococcal Polysaccharide-23 10/25/2014    Plan  Recommended patient receive annual flu vaccine in office.   Medication Management   Pt uses [facility] for all medications Uses pill box? No - lines up on night stand. She eventually will use a pill box.  Pt endorses good compliance and denies missed doses.   We discussed: Patient has been a patient sitter in the past and understands the importance of taking her medication. She lines it all up on her night stand.    Plan  Continue current medication management strategy    Follow up: 2 month phone visit

## 2019-11-02 NOTE — Patient Instructions (Signed)
Visit Information  Goals Addressed            This Visit's Progress   . Pharmacy Care Plan       CARE PLAN ENTRY (see longitudinal plan of care for additional care plan information)  Current Barriers:  . Chronic Disease Management support, education, and care coordination needs related to Hypertension, Hyperlipidemia, Depression, Hyperthyroidism and Anxiety   Hypertension BP Readings from Last 3 Encounters:  08/15/19 (!) 150/82  05/24/19 (!) 195/88  05/24/19 132/86   . Pharmacist Clinical Goal(s): o Over the next 90 days, patient will work with PharmD and providers to achieve BP goal <140/90 . Current regimen:  o Clonidine 0.1 mg twice daily o Valsartan 40 mg daily  . Interventions: o Recommended patient continue taking medication as prescribed.  o Patient is out of valsartan. Pharmacist coordianting refill with [employer].  . Patient self care activities - Over the next 90 days, patient will: o Check BP weekly, document, and provide at future appointments o Ensure daily salt intake < 2300 mg/day  Hyperlipidemia Lab Results  Component Value Date/Time   LDLCALC 73 08/02/2019 04:11 PM   . Pharmacist Clinical Goal(s): o Over the next 90 days, patient will work with PharmD and providers to maintain LDL goal < 100  . Current regimen:  o Rosuvastatin 20 mg daily  . Interventions: o Encouraged patient to resume water aerobics when tolerated/safe.  . Patient self care activities - Over the next 90 days, patient will: o Continue taking current medications.  o Reach out to provider or pharmacist with any questions/concerns.   Hyperthyroid . Pharmacist Clinical Goal(s) o Over the next 90 days, patient will work with PharmD and providers to reduce symptoms of hyperthyroidism.  . Current regimen:  o Methimazole 10 mg bid o Atenolol 50 mg bid  . Interventions: o Discussed upcoming thyroidectomy in November.  . Patient self care activities - Over the next 90 days, patient  will: o Follow-up with endocrinologist as scheduled.  o Contact provider or pharmacist with any questions or concerns.   Medication management . Pharmacist Clinical Goal(s): o Over the next 90 days, patient will work with PharmD and providers to achieve optimal medication adherence . Current pharmacy: [facility] . Interventions o Comprehensive medication review performed. o Continue current medication management strategy . Patient self care activities - Over the next 90 days, patient will: o Focus on medication adherence by continuing to keep medications in same area and taking as prescribed.  o Take medications as prescribed o Report any questions or concerns to PharmD and/or provider(s)  Please see past updates related to this goal by clicking on the "Past Updates" button in the selected goal         The patient verbalized understanding of instructions provided today and declined a print copy of patient instruction materials.   Telephone follow up appointment with pharmacy team member scheduled for:12/2019  Sherre Poot, PharmD, [facility] Clinical Pharmacist [facility] [phone] (office) [phone] (mobile)   DASH Eating Plan DASH stands for "Dietary Approaches to Stop Hypertension." The DASH eating plan is a healthy eating plan that has been shown to reduce high blood pressure (hypertension). It may also reduce your risk for type 2 diabetes, heart disease, and stroke. The DASH eating plan may also help with weight loss. What are tips for following this plan?  General guidelines  Avoid eating more than 2,300 mg (milligrams) of salt (sodium) a day. If you have hypertension, you  may need to reduce your sodium intake to 1,500 mg a day.  Limit alcohol intake to no more than 1 drink a day for nonpregnant women and 2 drinks a day for men. One drink equals 12 oz of beer, 5 oz of wine, or 1 oz of hard liquor.  Work with your health care provider to maintain  a healthy body weight or to lose weight. Ask what an ideal weight is for you.  Get at least 30 minutes of exercise that causes your heart to beat faster (aerobic exercise) most days of the week. Activities may include walking, swimming, or biking.  Work with your health care provider or diet and nutrition specialist (dietitian) to adjust your eating plan to your individual calorie needs. Reading food labels   Check food labels for the amount of sodium per serving. Choose foods with less than 5 percent of the Daily Value of sodium. Generally, foods with less than 300 mg of sodium per serving fit into this eating plan.  To find whole grains, look for the word "whole" as the first word in the ingredient list. Shopping  Buy products labeled as "low-sodium" or "no salt added."  Buy fresh foods. Avoid canned foods and premade or frozen meals. Cooking  Avoid adding salt when cooking. Use salt-free seasonings or herbs instead of table salt or sea salt. Check with your health care provider or pharmacist before using salt substitutes.  Do not fry foods. Cook foods using healthy methods such as baking, boiling, grilling, and broiling instead.  Cook with heart-healthy oils, such as olive, canola, soybean, or sunflower oil. Meal planning  Eat a balanced diet that includes: ? 5 or more servings of fruits and vegetables each day. At each meal, try to fill half of your plate with fruits and vegetables. ? Up to 6-8 servings of whole grains each day. ? Less than 6 oz of lean meat, poultry, or fish each day. A 3-oz serving of meat is about the same size as a deck of cards. One egg equals 1 oz. ? 2 servings of low-fat dairy each day. ? A serving of nuts, seeds, or beans 5 times each week. ? Heart-healthy fats. Healthy fats called Omega-3 fatty acids are found in foods such as flaxseeds and coldwater fish, like sardines, salmon, and mackerel.  Limit how much you eat of the following: ? Canned or  prepackaged foods. ? Food that is high in trans fat, such as fried foods. ? Food that is high in saturated fat, such as fatty meat. ? Sweets, desserts, sugary drinks, and other foods with added sugar. ? Full-fat dairy products.  Do not salt foods before eating.  Try to eat at least 2 vegetarian meals each week.  Eat more home-cooked food and less restaurant, buffet, and fast food.  When eating at a restaurant, ask that your food be prepared with less salt or no salt, if possible. What foods are recommended? The items listed may not be a complete list. Talk with your dietitian about what dietary choices are best for you. Grains Whole-grain or whole-wheat bread. Whole-grain or whole-wheat pasta. Imberly Troxler rice. Modena Morrow. Bulgur. Whole-grain and low-sodium cereals. Pita bread. Low-fat, low-sodium crackers. Whole-wheat flour tortillas. Vegetables Fresh or frozen vegetables (raw, steamed, roasted, or grilled). Low-sodium or reduced-sodium tomato and vegetable juice. Low-sodium or reduced-sodium tomato sauce and tomato paste. Low-sodium or reduced-sodium canned vegetables. Fruits All fresh, dried, or frozen fruit. Canned fruit in natural juice (without added sugar). Meat and other  protein foods Skinless chicken or [place]. Ground chicken or [place]. Pork with fat trimmed off. Fish and seafood. Egg whites. Dried beans, peas, or lentils. Unsalted nuts, nut butters, and seeds. Unsalted canned beans. Lean cuts of beef with fat trimmed off. Low-sodium, lean deli meat. Dairy Low-fat (1%) or fat-free (skim) milk. Fat-free, low-fat, or reduced-fat cheeses. Nonfat, low-sodium ricotta or cottage cheese. Low-fat or nonfat yogurt. Low-fat, low-sodium cheese. Fats and oils Soft margarine without trans fats. Vegetable oil. Low-fat, reduced-fat, or light mayonnaise and salad dressings (reduced-sodium). Canola, safflower, olive, soybean, and sunflower oils. Avocado. Seasoning and other foods Herbs. Spices.  Seasoning mixes without salt. Unsalted popcorn and pretzels. Fat-free sweets. What foods are not recommended? The items listed may not be a complete list. Talk with your dietitian about what dietary choices are best for you. Grains Baked goods made with fat, such as croissants, muffins, or some breads. Dry pasta or rice meal packs. Vegetables Creamed or fried vegetables. Vegetables in a cheese sauce. Regular canned vegetables (not low-sodium or reduced-sodium). Regular canned tomato sauce and paste (not low-sodium or reduced-sodium). Regular tomato and vegetable juice (not low-sodium or reduced-sodium). Angie Fava. Olives. Fruits Canned fruit in a light or heavy syrup. Fried fruit. Fruit in cream or butter sauce. Meat and other protein foods Fatty cuts of meat. Ribs. Fried meat. Berniece Salines. Sausage. Bologna and other processed lunch meats. Salami. Fatback. Hotdogs. Bratwurst. Salted nuts and seeds. Canned beans with added salt. Canned or smoked fish. Whole eggs or egg yolks. Chicken or [place] with skin. Dairy Whole or 2% milk, cream, and half-and-half. Whole or full-fat cream cheese. Whole-fat or sweetened yogurt. Full-fat cheese. Nondairy creamers. Whipped toppings. Processed cheese and cheese spreads. Fats and oils Butter. Stick margarine. Lard. Shortening. Ghee. Bacon fat. Tropical oils, such as coconut, palm kernel, or palm oil. Seasoning and other foods Salted popcorn and pretzels. Onion salt, garlic salt, seasoned salt, table salt, and sea salt. Worcestershire sauce. Tartar sauce. Barbecue sauce. Teriyaki sauce. Soy sauce, including reduced-sodium. Steak sauce. Canned and packaged gravies. Fish sauce. Oyster sauce. Cocktail sauce. Horseradish that you find on the shelf. Ketchup. Mustard. Meat flavorings and tenderizers. Bouillon cubes. Hot sauce and Tabasco sauce. Premade or packaged marinades. Premade or packaged taco seasonings. Relishes. Regular salad dressings. Where to find more  information:  National Heart, Lung, and [place]: https://wilson-eaton.com/  American Heart Association: www.heart.org Summary  The DASH eating plan is a healthy eating plan that has been shown to reduce high blood pressure (hypertension). It may also reduce your risk for type 2 diabetes, heart disease, and stroke.  With the DASH eating plan, you should limit salt (sodium) intake to 2,300 mg a day. If you have hypertension, you may need to reduce your sodium intake to 1,500 mg a day.  When on the DASH eating plan, aim to eat more fresh fruits and vegetables, whole grains, lean proteins, low-fat dairy, and heart-healthy fats.  Work with your health care provider or diet and nutrition specialist (dietitian) to adjust your eating plan to your individual calorie needs. This information is not intended to replace advice given to you by your health care provider. Make sure you discuss any questions you have with your health care provider. Document Revised: 12/12/2016 Document Reviewed: 12/24/2015 Elsevier Patient Education  2020 [employer].

## 2019-11-10 ENCOUNTER — Other Ambulatory Visit: Payer: Self-pay

## 2019-11-10 ENCOUNTER — Ambulatory Visit (INDEPENDENT_AMBULATORY_CARE_PROVIDER_SITE_OTHER): Payer: Medicare HMO

## 2019-11-10 ENCOUNTER — Other Ambulatory Visit: Payer: Self-pay | Admitting: Otolaryngology

## 2019-11-10 DIAGNOSIS — Z23 Encounter for immunization: Secondary | ICD-10-CM | POA: Diagnosis not present

## 2019-11-10 DIAGNOSIS — K219 Gastro-esophageal reflux disease without esophagitis: Secondary | ICD-10-CM

## 2019-11-10 MED ORDER — NEXIUM 40 MG PO CPDR: DELAYED_RELEASE_CAPSULE | ORAL | 0 refills | Status: DC

## 2019-11-10 MED ORDER — VALSARTAN 40 MG PO TABS: 40.0000 mg | ORAL_TABLET | Freq: Every day | ORAL | 0 refills | Status: DC

## 2019-11-16 ENCOUNTER — Emergency Department (HOSPITAL_COMMUNITY): Payer: Medicare HMO

## 2019-11-16 ENCOUNTER — Ambulatory Visit: Payer: Medicare HMO

## 2019-11-16 ENCOUNTER — Encounter: Payer: Self-pay | Admitting: Family Medicine

## 2019-11-16 ENCOUNTER — Ambulatory Visit: Payer: Medicare HMO | Admitting: Family Medicine

## 2019-11-16 ENCOUNTER — Other Ambulatory Visit: Payer: Self-pay | Admitting: Family Medicine

## 2019-11-16 ENCOUNTER — Other Ambulatory Visit: Payer: Self-pay

## 2019-11-16 ENCOUNTER — Ambulatory Visit (INDEPENDENT_AMBULATORY_CARE_PROVIDER_SITE_OTHER): Payer: Medicare HMO | Admitting: Family Medicine

## 2019-11-16 ENCOUNTER — Encounter (HOSPITAL_COMMUNITY): Payer: Self-pay | Admitting: Family Medicine

## 2019-11-16 ENCOUNTER — Inpatient Hospital Stay (HOSPITAL_COMMUNITY)
Admission: EM | Admit: 2019-11-16 | Discharge: 2019-11-21 | DRG: 291 | Disposition: A | Discharge status: Home / Self Care | Payer: Medicare HMO | Attending: Internal Medicine | Admitting: Internal Medicine

## 2019-11-16 VITALS — BP 180/112 | HR 102 | Temp 97.2°F | Ht 62.0 in | Wt 180.0 lb

## 2019-11-16 DIAGNOSIS — I110 Hypertensive heart disease with heart failure: Secondary | ICD-10-CM | POA: Diagnosis not present

## 2019-11-16 DIAGNOSIS — I10 Essential (primary) hypertension: Secondary | ICD-10-CM | POA: Diagnosis not present

## 2019-11-16 DIAGNOSIS — E782 Mixed hyperlipidemia: Secondary | ICD-10-CM

## 2019-11-16 DIAGNOSIS — R778 Other specified abnormalities of plasma proteins: Secondary | ICD-10-CM | POA: Diagnosis present

## 2019-11-16 DIAGNOSIS — K219 Gastro-esophageal reflux disease without esophagitis: Secondary | ICD-10-CM | POA: Diagnosis present

## 2019-11-16 DIAGNOSIS — E669 Obesity, unspecified: Secondary | ICD-10-CM | POA: Diagnosis present

## 2019-11-16 DIAGNOSIS — I361 Nonrheumatic tricuspid (valve) insufficiency: Secondary | ICD-10-CM | POA: Diagnosis not present

## 2019-11-16 DIAGNOSIS — R000 Tachycardia, unspecified: Secondary | ICD-10-CM | POA: Diagnosis present

## 2019-11-16 DIAGNOSIS — I160 Hypertensive urgency: Secondary | ICD-10-CM | POA: Diagnosis present

## 2019-11-16 DIAGNOSIS — I509 Heart failure, unspecified: Secondary | ICD-10-CM

## 2019-11-16 DIAGNOSIS — I712 Thoracic aortic aneurysm, without rupture: Secondary | ICD-10-CM | POA: Diagnosis not present

## 2019-11-16 DIAGNOSIS — R0789 Other chest pain: Secondary | ICD-10-CM

## 2019-11-16 DIAGNOSIS — Z6834 Body mass index (BMI) 34.0-34.9, adult: Secondary | ICD-10-CM

## 2019-11-16 DIAGNOSIS — Z20822 Contact with and (suspected) exposure to covid-19: Secondary | ICD-10-CM | POA: Diagnosis present

## 2019-11-16 DIAGNOSIS — I429 Cardiomyopathy, unspecified: Secondary | ICD-10-CM | POA: Diagnosis present

## 2019-11-16 DIAGNOSIS — D509 Iron deficiency anemia, unspecified: Secondary | ICD-10-CM | POA: Diagnosis not present

## 2019-11-16 DIAGNOSIS — Z8249 Family history of ischemic heart disease and other diseases of the circulatory system: Secondary | ICD-10-CM | POA: Diagnosis not present

## 2019-11-16 DIAGNOSIS — R600 Localized edema: Secondary | ICD-10-CM

## 2019-11-16 DIAGNOSIS — E0590 Thyrotoxicosis, unspecified without thyrotoxic crisis or storm: Secondary | ICD-10-CM | POA: Diagnosis present

## 2019-11-16 DIAGNOSIS — N390 Urinary tract infection, site not specified: Secondary | ICD-10-CM | POA: Diagnosis present

## 2019-11-16 DIAGNOSIS — G8929 Other chronic pain: Secondary | ICD-10-CM | POA: Insufficient documentation

## 2019-11-16 DIAGNOSIS — I5043 Acute on chronic combined systolic (congestive) and diastolic (congestive) heart failure: Secondary | ICD-10-CM | POA: Diagnosis not present

## 2019-11-16 DIAGNOSIS — B9620 Unspecified Escherichia coli [E. coli] as the cause of diseases classified elsewhere: Secondary | ICD-10-CM | POA: Diagnosis present

## 2019-11-16 DIAGNOSIS — F419 Anxiety disorder, unspecified: Secondary | ICD-10-CM | POA: Diagnosis present

## 2019-11-16 DIAGNOSIS — E0500 Thyrotoxicosis with diffuse goiter without thyrotoxic crisis or storm: Secondary | ICD-10-CM | POA: Diagnosis present

## 2019-11-16 DIAGNOSIS — I083 Combined rheumatic disorders of mitral, aortic and tricuspid valves: Secondary | ICD-10-CM | POA: Diagnosis present

## 2019-11-16 DIAGNOSIS — R0602 Shortness of breath: Secondary | ICD-10-CM | POA: Diagnosis not present

## 2019-11-16 DIAGNOSIS — F32A Depression, unspecified: Secondary | ICD-10-CM | POA: Diagnosis not present

## 2019-11-16 DIAGNOSIS — F5101 Primary insomnia: Secondary | ICD-10-CM | POA: Insufficient documentation

## 2019-11-16 DIAGNOSIS — I5021 Acute systolic (congestive) heart failure: Secondary | ICD-10-CM | POA: Diagnosis not present

## 2019-11-16 DIAGNOSIS — F411 Generalized anxiety disorder: Secondary | ICD-10-CM | POA: Diagnosis present

## 2019-11-16 DIAGNOSIS — R011 Cardiac murmur, unspecified: Secondary | ICD-10-CM | POA: Insufficient documentation

## 2019-11-16 DIAGNOSIS — E039 Hypothyroidism, unspecified: Secondary | ICD-10-CM | POA: Insufficient documentation

## 2019-11-16 DIAGNOSIS — J9811 Atelectasis: Secondary | ICD-10-CM | POA: Diagnosis not present

## 2019-11-16 DIAGNOSIS — R7301 Impaired fasting glucose: Secondary | ICD-10-CM

## 2019-11-16 DIAGNOSIS — I5033 Acute on chronic diastolic (congestive) heart failure: Secondary | ICD-10-CM | POA: Diagnosis not present

## 2019-11-16 DIAGNOSIS — G4709 Other insomnia: Secondary | ICD-10-CM | POA: Insufficient documentation

## 2019-11-16 DIAGNOSIS — I77819 Aortic ectasia, unspecified site: Secondary | ICD-10-CM | POA: Diagnosis present

## 2019-11-16 DIAGNOSIS — D649 Anemia, unspecified: Secondary | ICD-10-CM

## 2019-11-16 DIAGNOSIS — I5030 Unspecified diastolic (congestive) heart failure: Secondary | ICD-10-CM | POA: Diagnosis not present

## 2019-11-16 DIAGNOSIS — F331 Major depressive disorder, recurrent, moderate: Secondary | ICD-10-CM

## 2019-11-16 DIAGNOSIS — R7989 Other specified abnormal findings of blood chemistry: Secondary | ICD-10-CM | POA: Diagnosis present

## 2019-11-16 DIAGNOSIS — T300 Burn of unspecified body region, unspecified degree: Secondary | ICD-10-CM | POA: Insufficient documentation

## 2019-11-16 DIAGNOSIS — I340 Nonrheumatic mitral (valve) insufficiency: Secondary | ICD-10-CM | POA: Diagnosis not present

## 2019-11-16 DIAGNOSIS — I350 Nonrheumatic aortic (valve) stenosis: Secondary | ICD-10-CM | POA: Diagnosis not present

## 2019-11-16 DIAGNOSIS — Z79899 Other long term (current) drug therapy: Secondary | ICD-10-CM

## 2019-11-16 DIAGNOSIS — I351 Nonrheumatic aortic (valve) insufficiency: Secondary | ICD-10-CM | POA: Diagnosis not present

## 2019-11-16 DIAGNOSIS — I5020 Unspecified systolic (congestive) heart failure: Secondary | ICD-10-CM | POA: Diagnosis not present

## 2019-11-16 HISTORY — DX: Gastro-esophageal reflux disease without esophagitis: K21.9

## 2019-11-16 HISTORY — DX: Other specified abnormalities of plasma proteins: R77.8

## 2019-11-16 HISTORY — DX: Heart failure, unspecified: I50.9

## 2019-11-16 HISTORY — DX: Hypertensive urgency: I16.0

## 2019-11-16 HISTORY — DX: Anemia, unspecified: D64.9

## 2019-11-16 HISTORY — DX: Other specified abnormal findings of blood chemistry: R79.89

## 2019-11-16 LAB — RESPIRATORY PANEL BY RT PCR (FLU A&B, COVID)
Influenza A by PCR: NEGATIVE
Influenza B by PCR: NEGATIVE
SARS Coronavirus 2 by RT PCR: NEGATIVE

## 2019-11-16 LAB — CBC WITH DIFFERENTIAL/PLATELET
Abs Immature Granulocytes: 0.02 10*3/uL (ref 0.00–0.07)
Basophils Absolute: 0 10*3/uL (ref 0.0–0.1)
Basophils Relative: 0 %
Eosinophils Absolute: 0.1 10*3/uL (ref 0.0–0.5)
Eosinophils Relative: 1 %
HCT: 29.1 % — ABNORMAL LOW (ref 36.0–46.0)
Hemoglobin: 8.9 g/dL — ABNORMAL LOW (ref 12.0–15.0)
Immature Granulocytes: 0 %
Lymphocytes Relative: 21 %
Lymphs Abs: 1.5 10*3/uL (ref 0.7–4.0)
MCH: 23.5 pg — ABNORMAL LOW (ref 26.0–34.0)
MCHC: 30.6 g/dL (ref 30.0–36.0)
MCV: 76.8 fL — ABNORMAL LOW (ref 80.0–100.0)
Monocytes Absolute: 0.6 10*3/uL (ref 0.1–1.0)
Monocytes Relative: 8 %
Neutro Abs: 5 10*3/uL (ref 1.7–7.7)
Neutrophils Relative %: 70 %
Platelets: 249 10*3/uL (ref 150–400)
RBC: 3.79 MIL/uL — ABNORMAL LOW (ref 3.87–5.11)
RDW: 18.6 % — ABNORMAL HIGH (ref 11.5–15.5)
WBC: 7.1 10*3/uL (ref 4.0–10.5)
nRBC: 0 % (ref 0.0–0.2)

## 2019-11-16 LAB — BASIC METABOLIC PANEL
Anion gap: 9 (ref 5–15)
BUN: 12 mg/dL (ref 8–23)
CO2: 24 mmol/L (ref 22–32)
Calcium: 8.3 mg/dL — ABNORMAL LOW (ref 8.9–10.3)
Chloride: 104 mmol/L (ref 98–111)
Creatinine, Ser: 0.4 mg/dL — ABNORMAL LOW (ref 0.44–1.00)
GFR, Estimated: >60 mL/min (ref >60)
Glucose, Bld: 122 mg/dL — ABNORMAL HIGH (ref 70–99)
Potassium: 3.3 mmol/L — ABNORMAL LOW (ref 3.5–5.1)
Sodium: 137 mmol/L (ref 135–145)

## 2019-11-16 LAB — TSH: TSH: <0.01 u[IU]/mL — ABNORMAL LOW (ref 0.350–4.500)

## 2019-11-16 LAB — TROPONIN I (HIGH SENSITIVITY)
Troponin I (High Sensitivity): 70 ng/L — ABNORMAL HIGH (ref <18)
Troponin I (High Sensitivity): 74 ng/L — ABNORMAL HIGH (ref <18)

## 2019-11-16 LAB — PRO B NATRIURETIC PEPTIDE: NT-Pro BNP: 8535 pg/mL — ABNORMAL HIGH (ref 0–287)

## 2019-11-16 LAB — PROTIME-INR
INR: 1.4 — ABNORMAL HIGH (ref 0.8–1.2)
Prothrombin Time: 16.7 seconds — ABNORMAL HIGH (ref 11.4–15.2)

## 2019-11-16 LAB — TROPONIN T: Troponin T (Highly Sensitive): 33 ng/L (ref 0–14)

## 2019-11-16 LAB — BRAIN NATRIURETIC PEPTIDE: B Natriuretic Peptide: 2637.8 pg/mL — ABNORMAL HIGH (ref 0.0–100.0)

## 2019-11-16 LAB — T4, FREE: Free T4: 2 ng/dL — ABNORMAL HIGH (ref 0.61–1.12)

## 2019-11-16 MED ORDER — FUROSEMIDE 10 MG/ML IJ SOLN
40.0000 mg | Freq: Two times a day (BID) | INTRAMUSCULAR | Status: DC
  Administered 2019-11-17 – 2019-11-18 (×4): 40 mg via INTRAVENOUS
  Filled 2019-11-16 (×4): qty 4

## 2019-11-16 MED ORDER — ONDANSETRON HCL 4 MG/2ML IJ SOLN: 4.0000 mg | Freq: Four times a day (QID) | INTRAMUSCULAR | Status: DC | PRN

## 2019-11-16 MED ORDER — ZOLPIDEM TARTRATE 5 MG PO TABS
5.0000 mg | ORAL_TABLET | Freq: Every evening | ORAL | Status: DC | PRN
  Administered 2019-11-17 – 2019-11-20 (×5): 5 mg via ORAL
  Filled 2019-11-16 (×5): qty 1

## 2019-11-16 MED ORDER — FUROSEMIDE 20 MG PO TABS: 20.0000 mg | ORAL_TABLET | Freq: Every day | ORAL | 3 refills | Status: DC

## 2019-11-16 MED ORDER — PROPRANOLOL HCL 80 MG PO TABS
80.0000 mg | ORAL_TABLET | Freq: Four times a day (QID) | ORAL | Status: DC
  Filled 2019-11-16 (×2): qty 1

## 2019-11-16 MED ORDER — PROPRANOLOL HCL 1 MG/ML IV SOLN
1.0000 mg | Freq: Once | INTRAVENOUS | Status: AC
  Administered 2019-11-16: 1 mg via INTRAVENOUS
  Filled 2019-11-16: qty 1

## 2019-11-16 MED ORDER — PROPRANOLOL HCL 60 MG PO TABS
60.0000 mg | ORAL_TABLET | Freq: Four times a day (QID) | ORAL | Status: DC
  Administered 2019-11-17: 60 mg via ORAL
  Filled 2019-11-16 (×3): qty 1

## 2019-11-16 MED ORDER — METHIMAZOLE 10 MG PO TABS
10.0000 mg | ORAL_TABLET | Freq: Once | ORAL | Status: AC
  Administered 2019-11-16: 10 mg via ORAL
  Filled 2019-11-16: qty 1

## 2019-11-16 MED ORDER — DILTIAZEM HCL ER COATED BEADS 120 MG PO CP24: 120.0000 mg | ORAL_CAPSULE | Freq: Every day | ORAL | 0 refills | Status: DC

## 2019-11-16 MED ORDER — VALSARTAN 80 MG PO TABS: 80.0000 mg | ORAL_TABLET | Freq: Every day | ORAL | 2 refills | Status: DC

## 2019-11-16 MED ORDER — POTASSIUM CHLORIDE CRYS ER 20 MEQ PO TBCR
40.0000 meq | EXTENDED_RELEASE_TABLET | Freq: Once | ORAL | Status: AC
  Administered 2019-11-16: 40 meq via ORAL
  Filled 2019-11-16: qty 2

## 2019-11-16 MED ORDER — CLONIDINE HCL 0.1 MG PO TABS
0.1000 mg | ORAL_TABLET | Freq: Two times a day (BID) | ORAL | Status: DC
  Administered 2019-11-16 – 2019-11-17 (×2): 0.1 mg via ORAL
  Filled 2019-11-16 (×2): qty 1

## 2019-11-16 MED ORDER — METHIMAZOLE 10 MG PO TABS
10.0000 mg | ORAL_TABLET | Freq: Two times a day (BID) | ORAL | Status: DC
  Filled 2019-11-16: qty 1

## 2019-11-16 MED ORDER — DILTIAZEM HCL ER COATED BEADS 120 MG PO CP24
120.0000 mg | ORAL_CAPSULE | Freq: Every day | ORAL | 0 refills | Status: DC
Start: 2019-11-16 — End: 2019-11-17

## 2019-11-16 MED ORDER — ACETAMINOPHEN 325 MG PO TABS: 650.0000 mg | ORAL_TABLET | ORAL | Status: DC | PRN

## 2019-11-16 MED ORDER — FUROSEMIDE 10 MG/ML IJ SOLN
40.0000 mg | Freq: Once | INTRAMUSCULAR | Status: AC
  Administered 2019-11-16: 40 mg via INTRAVENOUS
  Filled 2019-11-16: qty 4

## 2019-11-16 MED ORDER — PROPRANOLOL HCL 60 MG PO TABS
60.0000 mg | ORAL_TABLET | Freq: Four times a day (QID) | ORAL | Status: DC
  Filled 2019-11-16 (×2): qty 1
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
## 2019-11-16 NOTE — ED Triage Notes (Addendum)
Patient reports chronic SOB with chest tightness for several weeks , denies chills or fever . Occasional dry cough.
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
## 2019-11-16 NOTE — Patient Instructions (Addendum)
TRINTELLIX 10 MG ONCE DAILY IN AM.  CONTINUE ATENOLOL 50 MG ONE TWICE A DAY.  START ON CARDIZEM CD 120 MG ONCE DAILY.  START VALSARTAN 80 MG ONCE DAILY. START LASIX 20 MG ONCE DAILY.   KEEP APPT WITH DR. Bettina Gavia TOMORROW AT 9 A.M. AT [facility].

## 2019-11-16 NOTE — ED Notes (Signed)
Dr. Myna Hidalgo advised to hold oral propanolol at this time. Wait 51mns- 1hr after clonidine and wait until pt goes upstairs before giving the oral propanolol.

## 2019-11-17 ENCOUNTER — Inpatient Hospital Stay (HOSPITAL_COMMUNITY): Payer: Medicare HMO

## 2019-11-17 ENCOUNTER — Encounter (HOSPITAL_COMMUNITY): Payer: Self-pay | Admitting: Family Medicine

## 2019-11-17 ENCOUNTER — Other Ambulatory Visit (HOSPITAL_COMMUNITY): Payer: Medicare HMO

## 2019-11-17 ENCOUNTER — Ambulatory Visit: Payer: Medicare HMO | Admitting: Cardiology

## 2019-11-17 LAB — BASIC METABOLIC PANEL
Anion gap: 11 (ref 5–15)
BUN: 9 mg/dL (ref 8–23)
CO2: 23 mmol/L (ref 22–32)
Calcium: 8.4 mg/dL — ABNORMAL LOW (ref 8.9–10.3)
Chloride: 104 mmol/L (ref 98–111)
Creatinine, Ser: 0.4 mg/dL — ABNORMAL LOW (ref 0.44–1.00)
GFR, Estimated: >60 mL/min (ref >60)
Glucose, Bld: 94 mg/dL (ref 70–99)
Potassium: 3.6 mmol/L (ref 3.5–5.1)
Sodium: 138 mmol/L (ref 135–145)

## 2019-11-17 LAB — CBC WITH DIFFERENTIAL/PLATELET
Basophils Absolute: 0 10*3/uL (ref 0.0–0.2)
Basos: 0 %
EOS (ABSOLUTE): 0 10*3/uL (ref 0.0–0.4)
Eos: 1 %
Hematocrit: 28.4 % — ABNORMAL LOW (ref 34.0–46.6)
Hemoglobin: 8.9 g/dL — ABNORMAL LOW (ref 11.1–15.9)
Immature Grans (Abs): 0 10*3/uL (ref 0.0–0.1)
Immature Granulocytes: 0 %
Lymphocytes Absolute: 1.3 10*3/uL (ref 0.7–3.1)
Lymphs: 19 %
MCH: 24.5 pg — ABNORMAL LOW (ref 26.6–33.0)
MCHC: 31.3 g/dL — ABNORMAL LOW (ref 31.5–35.7)
MCV: 78 fL — ABNORMAL LOW (ref 79–97)
Monocytes Absolute: 0.6 10*3/uL (ref 0.1–0.9)
Monocytes: 9 %
Neutrophils Absolute: 4.7 10*3/uL (ref 1.4–7.0)
Neutrophils: 71 %
Platelets: 245 10*3/uL (ref 150–450)
RBC: 3.64 x10E6/uL — ABNORMAL LOW (ref 3.77–5.28)
RDW: 16.6 % — ABNORMAL HIGH (ref 11.7–15.4)
WBC: 6.6 10*3/uL (ref 3.4–10.8)

## 2019-11-17 LAB — COMPREHENSIVE METABOLIC PANEL
ALT: 9 IU/L (ref 0–32)
AST: 23 IU/L (ref 0–40)
Albumin/Globulin Ratio: 0.9 — ABNORMAL LOW (ref 1.2–2.2)
Albumin: 3.3 g/dL — ABNORMAL LOW (ref 3.8–4.8)
Alkaline Phosphatase: 185 IU/L — ABNORMAL HIGH (ref 44–121)
BUN/Creatinine Ratio: 30 — ABNORMAL HIGH (ref 12–28)
BUN: 14 mg/dL (ref 8–27)
Bilirubin Total: 1.8 mg/dL — ABNORMAL HIGH (ref 0.0–1.2)
CO2: 23 mmol/L (ref 20–29)
Calcium: 8.3 mg/dL — ABNORMAL LOW (ref 8.7–10.3)
Chloride: 106 mmol/L (ref 96–106)
Creatinine, Ser: 0.47 mg/dL — ABNORMAL LOW (ref 0.57–1.00)
GFR calc Af Amer: 122 mL/min/{1.73_m2} (ref >59)
GFR calc non Af Amer: 106 mL/min/{1.73_m2} (ref >59)
Globulin, Total: 3.6 g/dL (ref 1.5–4.5)
Glucose: 98 mg/dL (ref 65–99)
Potassium: 4 mmol/L (ref 3.5–5.2)
Sodium: 143 mmol/L (ref 134–144)
Total Protein: 6.9 g/dL (ref 6.0–8.5)

## 2019-11-17 LAB — CARDIOVASCULAR RISK ASSESSMENT

## 2019-11-17 LAB — LIPID PANEL
Chol/HDL Ratio: 3.9 ratio (ref 0.0–4.4)
Cholesterol, Total: 114 mg/dL (ref 100–199)
HDL: 29 mg/dL — ABNORMAL LOW (ref >39)
LDL Chol Calc (NIH): 71 mg/dL (ref 0–99)
Triglycerides: 62 mg/dL (ref 0–149)
VLDL Cholesterol Cal: 14 mg/dL (ref 5–40)

## 2019-11-17 LAB — HIV ANTIBODY (ROUTINE TESTING W REFLEX): HIV Screen 4th Generation wRfx: NONREACTIVE

## 2019-11-17 LAB — URINALYSIS, ROUTINE W REFLEX MICROSCOPIC
Bilirubin Urine: NEGATIVE
Glucose, UA: NEGATIVE mg/dL
Ketones, ur: NEGATIVE mg/dL
Leukocytes,Ua: NEGATIVE
Nitrite: NEGATIVE
Protein, ur: 100 mg/dL — AB
Specific Gravity, Urine: 1.011 (ref 1.005–1.030)
pH: 5 (ref 5.0–8.0)

## 2019-11-17 LAB — HEMOGLOBIN A1C
Est. average glucose Bld gHb Est-mCnc: 123 mg/dL
Hgb A1c MFr Bld: 5.9 % — ABNORMAL HIGH (ref 4.8–5.6)

## 2019-11-17 LAB — MAGNESIUM: Magnesium: 1.1 mg/dL — ABNORMAL LOW (ref 1.7–2.4)

## 2019-11-17 MED ORDER — METHIMAZOLE 10 MG PO TABS
10.0000 mg | ORAL_TABLET | Freq: Three times a day (TID) | ORAL | Status: DC
  Administered 2019-11-17 – 2019-11-21 (×14): 10 mg via ORAL
  Filled 2019-11-17 (×15): qty 1

## 2019-11-17 MED ORDER — CLONIDINE HCL 0.1 MG PO TABS
0.1000 mg | ORAL_TABLET | Freq: Every day | ORAL | Status: DC
  Administered 2019-11-18 – 2019-11-21 (×4): 0.1 mg via ORAL
  Filled 2019-11-17 (×4): qty 1

## 2019-11-17 MED ORDER — IRBESARTAN 75 MG PO TABS
75.0000 mg | ORAL_TABLET | Freq: Every day | ORAL | Status: DC
  Administered 2019-11-17 – 2019-11-21 (×5): 75 mg via ORAL
  Filled 2019-11-17 (×5): qty 1

## 2019-11-17 MED ORDER — SODIUM CHLORIDE 0.9% FLUSH
3.0000 mL | Freq: Two times a day (BID) | INTRAVENOUS | Status: DC
  Administered 2019-11-17 – 2019-11-21 (×9): 3 mL via INTRAVENOUS

## 2019-11-17 MED ORDER — ENOXAPARIN SODIUM 40 MG/0.4ML ~~LOC~~ SOLN
40.0000 mg | Freq: Every day | SUBCUTANEOUS | Status: DC
  Administered 2019-11-17 – 2019-11-21 (×5): 40 mg via SUBCUTANEOUS
  Filled 2019-11-17 (×5): qty 0.4

## 2019-11-17 MED ORDER — MAGNESIUM SULFATE 2 GM/50ML IV SOLN
2.0000 g | Freq: Once | INTRAVENOUS | Status: AC
  Administered 2019-11-17: 2 g via INTRAVENOUS
  Filled 2019-11-17: qty 50

## 2019-11-17 MED ORDER — PANTOPRAZOLE SODIUM 40 MG PO TBEC
40.0000 mg | DELAYED_RELEASE_TABLET | Freq: Two times a day (BID) | ORAL | Status: DC
  Administered 2019-11-17 – 2019-11-21 (×9): 40 mg via ORAL
  Filled 2019-11-17 (×9): qty 1

## 2019-11-17 MED ORDER — SODIUM CHLORIDE 0.9 % IV SOLN: 250.0000 mL | INTRAVENOUS | Status: DC | PRN

## 2019-11-17 MED ORDER — PROPRANOLOL HCL 60 MG PO TABS
60.0000 mg | ORAL_TABLET | Freq: Two times a day (BID) | ORAL | Status: DC
  Administered 2019-11-17 – 2019-11-21 (×8): 60 mg via ORAL
  Filled 2019-11-17 (×9): qty 1

## 2019-11-17 MED ORDER — SODIUM CHLORIDE 0.9% FLUSH: 3.0000 mL | INTRAVENOUS | Status: DC | PRN

## 2019-11-17 MED ORDER — POTASSIUM CHLORIDE CRYS ER 20 MEQ PO TBCR
40.0000 meq | EXTENDED_RELEASE_TABLET | Freq: Two times a day (BID) | ORAL | Status: AC
  Administered 2019-11-17 (×2): 40 meq via ORAL
  Filled 2019-11-17 (×2): qty 2

## 2019-11-17 NOTE — Progress Notes (Signed)
Patient arrived to room 6E15 in NAD. VS stable, patient free from pain. Patient oriented to room and call bell in reach.

## 2019-11-17 NOTE — ED Notes (Signed)
Lunch Tray Ordered @ 1016.

## 2019-11-17 NOTE — ED Notes (Signed)
Called report on patient to [facility], Rm 15 RN. Waiting on patient food tray before transferring patient to floor. Nutrition said that tray was on the way at 12:25.

## 2019-11-17 NOTE — ED Notes (Signed)
Pt transferred to hospital bed for comfort.

## 2019-11-17 NOTE — Progress Notes (Signed)
PROGRESS NOTE    Donna Mayer  IRW:[id number] DOB: 1955/11/06 DOA: 11/16/2019 PCP: [name], MD   Brief Narrative: -year-old with past medical history significant for Graves' disease, depression, hypertension, iron deficiency anemia who presents to the emergency department complaining of worsening shortness of breath, chest tightness, lower extremity edema and orthopnea.  All the symptoms started months ago but has been getting progressively worse over the last few days.  She stopped taking methimazole for several days but lately she has been taking it twice a day.   Evaluation in the ED patient was found to be in acute heart failure with tachypnea, oxygen saturation in the 90s, chest x-ray with findings concerning with CHF, BNP elevated 2637, patient received IV Lasix in the ED.    Assessment & Plan:   Principal Problem:   Acute CHF (congestive heart failure) (HCC) Active Problems:   Hyperthyroidism   IDA (iron deficiency anemia)   Depression   Hypertensive urgency   Elevated troponin  1-Acute CHF: Present with dyspnea, chest x ray finding CHF. Elevated BNP Suspect related to hyperthyroidism. Echo ordered and pending. Continue with IV Lasix 40 mg IV twice daily Strict I's and O's and daily weight monitor electrolytes.  Kcl supplement added.   2-Hyperthyroidism: Uncontrolled, patient presented with tachycardia, TSH low at 0.010, free T 4 at 2 Increase methimazole to 10 mg TID.  Continue with inderal 60 mg BID.   3-Hypomagnesemia; replete IV>   4-Mild elevation of troponin.  Related to CHF.  Follow ECHO.  EKG; prolong QT, replacing mg.   5-HTN; Urgency, BP 211/112 in the ED Continue with clonidine, change to daily, SBP decreasing,.  Continue with irbesartan.  On IV lasix and inderal.   6-Iron deficiency anemia Hb stable.   Estimated body mass index is 34.27 kg/m as calculated from the following:   Height as of this encounter: 5' 2"  (1.575 m).   Weight as of  this encounter: 85 kg.   DVT prophylaxis: Lovenox Code Status: Full code Family Communication: Discussed with patient directly Disposition Plan:  Status is: Inpatient  Remains inpatient appropriate because:IV treatments appropriate due to intensity of illness or inability to take PO   Dispo: The patient is from: Home              Anticipated d/c is to: Home              Anticipated d/c date is: 2 days              Patient currently is not medically stable to d/c.        Consultants:   none  Procedures:   ECHO  Antimicrobials:  none  Subjective: She report improved of SOB and cough, she is still not able to lying down flat.   Objective: Vitals:   11/17/19 0615 11/17/19 0645 11/17/19 0715 11/17/19 0730  BP: (!) 142/85 138/80 130/81 121/74  Pulse: 84 90 75 76  Resp: (!) 24 (!) 33 (!) 26 (!) 23  Temp:      TempSrc:      SpO2: 100% 96% 96% 99%  Weight:      Height:       No intake or output data in the 24 hours ending 11/17/19 0845 Filed Weights   11/16/19 1945  Weight: 85 kg    Examination:  General exam: Appears calm and comfortable  Respiratory system: B/L crackles.  Respiratory effort normal. Cardiovascular system: S1 & S2 heard, RR tachycardic. positive JVD, murmurs,  rubs, gallops or clicks. No pedal edema. Gastrointestinal system: Abdomen is nondistended, soft and nontender. No organomegaly or masses felt. Normal bowel sounds heard. Central nervous system: Alert and oriented. No focal neurological deficits. Extremities: Symmetric 5 x 5 power. Plus 2 edema Skin: No rashes, lesions or ulcers   Data Reviewed: I have personally reviewed following labs and imaging studies  CBC: Recent Labs  Lab 11/16/19 1430 11/16/19 1956  WBC 6.6 7.1  NEUTROABS 4.7 5.0  HGB 8.9* 8.9*  HCT 28.4* 29.1*  MCV 78* 76.8*  PLT 245 945   Basic Metabolic Panel: Recent Labs  Lab 11/16/19 1430 11/16/19 1956 11/17/19 0343  NA 143 137 138  K 4.0 3.3* 3.6  CL 106  104 104  CO2 23 24 23   GLUCOSE 98 122* 94  BUN 14 12 9   CREATININE 0.47* 0.40* 0.40*  CALCIUM 8.3* 8.3* 8.4*  MG  --   --  1.1*   GFR: Estimated Creatinine Clearance: 73.8 mL/min (A) (by C-G formula based on SCr of 0.4 mg/dL (L)). Liver Function Tests: Recent Labs  Lab 11/16/19 1430  AST 23  ALT 9  ALKPHOS 185*  BILITOT 1.8*  PROT 6.9  ALBUMIN 3.3*   No results for input(s): LIPASE, AMYLASE in the last 168 hours. No results for input(s): AMMONIA in the last 168 hours. Coagulation Profile: Recent Labs  Lab 11/16/19 1956  INR 1.4*   Cardiac Enzymes: No results for input(s): CKTOTAL, CKMB, CKMBINDEX, TROPONINI in the last 168 hours. BNP (last 3 results) Recent Labs    11/16/19 1524  PROBNP 8,535*   HbA1C: Recent Labs    11/16/19 1430  HGBA1C 5.9*   CBG: No results for input(s): GLUCAP in the last 168 hours. Lipid Profile: Recent Labs    11/16/19 1430  CHOL 114  HDL 29*  LDLCALC 71  TRIG 62  CHOLHDL 3.9   Thyroid Function Tests: Recent Labs    11/16/19 2205  TSH <0.010*  FREET4 2.00*   Anemia Panel: No results for input(s): VITAMINB12, FOLATE, FERRITIN, TIBC, IRON, RETICCTPCT in the last 72 hours. Sepsis Labs: No results for input(s): PROCALCITON, LATICACIDVEN in the last 168 hours.  Recent Results (from the past 240 hour(s))  Respiratory Panel by RT PCR (Flu A&B, Covid) - Nasopharyngeal Swab     Status: None   Collection Time: 11/16/19 10:09 PM   Specimen: Nasopharyngeal Swab  Result Value Ref Range Status   SARS Coronavirus 2 by RT PCR NEGATIVE NEGATIVE Final    Comment: (NOTE) SARS-CoV-2 target nucleic acids are NOT DETECTED.  The SARS-CoV-2 RNA is generally detectable in upper respiratoy specimens during the acute phase of infection. The lowest concentration of SARS-CoV-2 viral copies this assay can detect is 131 copies/mL. A negative result does not preclude SARS-Cov-2 infection and should not be used as the sole basis for treatment  or other patient management decisions. A negative result may occur with  improper specimen collection/handling, submission of specimen other than nasopharyngeal swab, presence of viral mutation(s) within the areas targeted by this assay, and inadequate number of viral copies (<131 copies/mL). A negative result must be combined with clinical observations, patient history, and epidemiological information. The expected result is Negative.  Fact Sheet for Patients:  PinkCheek.be  Fact Sheet for Healthcare Providers:  GravelBags.it  This test is no t yet approved or cleared by the [place] FDA and  has been authorized for detection and/or diagnosis of SARS-CoV-2 by FDA under an Emergency Use Authorization (EUA). This  EUA will remain  in effect (meaning this test can be used) for the duration of the COVID-19 declaration under Section 564(b)(1) of the Act, 21 U.S.C. section 360bbb-3(b)(1), unless the authorization is terminated or revoked sooner.     Influenza A by PCR NEGATIVE NEGATIVE Final   Influenza B by PCR NEGATIVE NEGATIVE Final    Comment: (NOTE) The Xpert Xpress SARS-CoV-2/FLU/RSV assay is intended as an aid in  the diagnosis of influenza from Nasopharyngeal swab specimens and  should not be used as a sole basis for treatment. Nasal washings and  aspirates are unacceptable for Xpert Xpress SARS-CoV-2/FLU/RSV  testing.  Fact Sheet for Patients: PinkCheek.be  Fact Sheet for Healthcare Providers: GravelBags.it  This test is not yet approved or cleared by the [place] FDA and  has been authorized for detection and/or diagnosis of SARS-CoV-2 by  FDA under an Emergency Use Authorization (EUA). This EUA will remain  in effect (meaning this test can be used) for the duration of the  Covid-19 declaration under Section 564(b)(1) of the Act, 21  U.S.C.  section 360bbb-3(b)(1), unless the authorization is  terminated or revoked. Performed at [facility], [place] [address], [place], [place] [address]          Radiology Studies: DG Chest 2 View  Result Date: 11/16/2019 CLINICAL DATA:  Shortness of breath and chest tightness EXAM: CHEST - 2 VIEW COMPARISON:  05/24/2019 FINDINGS: Cardiac shadow is enlarged but accentuated by the frontal technique. Vascular congestion is noted with mild interstitial edema. Right basilar atelectasis is noted as well. No sizable effusion is seen. Gastric lap band is noted. No bony abnormality is seen. IMPRESSION: Changes of mild CHF. Electronically Signed   By: Inez Catalina M.D.   On: 11/16/2019 20:22        Scheduled Meds: . cloNIDine  0.1 mg Oral BID  . enoxaparin (LOVENOX) injection  40 mg Subcutaneous Daily  . furosemide  40 mg Intravenous Q12H  . irbesartan  75 mg Oral Daily  . methimazole  10 mg Oral BID  . pantoprazole  40 mg Oral BID  . propranolol  60 mg Oral Q6H  . sodium chloride flush  3 mL Intravenous Q12H   Continuous Infusions: . sodium chloride    . sodium chloride    . magnesium sulfate bolus IVPB 2 g (11/17/19 0807)     LOS: 1 day    Time spent: 35 minutes.     Elmarie Shiley, MD [facility]   If 7PM-7AM, please contact night-coverage [email or web address]  11/17/2019, 8:45 AM

## 2019-11-17 NOTE — Procedures (Signed)
Echo attempted. Unable to complete due to patient becoming short of breath. Nurse notified and patient requested that echo be attempted again later.

## 2019-11-18 ENCOUNTER — Inpatient Hospital Stay (HOSPITAL_COMMUNITY): Payer: Medicare HMO

## 2019-11-18 ENCOUNTER — Encounter (HOSPITAL_COMMUNITY): Payer: Self-pay | Admitting: Family Medicine

## 2019-11-18 DIAGNOSIS — I5033 Acute on chronic diastolic (congestive) heart failure: Secondary | ICD-10-CM | POA: Diagnosis not present

## 2019-11-18 DIAGNOSIS — I361 Nonrheumatic tricuspid (valve) insufficiency: Secondary | ICD-10-CM | POA: Diagnosis not present

## 2019-11-18 DIAGNOSIS — I351 Nonrheumatic aortic (valve) insufficiency: Secondary | ICD-10-CM | POA: Diagnosis not present

## 2019-11-18 DIAGNOSIS — I340 Nonrheumatic mitral (valve) insufficiency: Secondary | ICD-10-CM | POA: Diagnosis not present

## 2019-11-18 DIAGNOSIS — I5020 Unspecified systolic (congestive) heart failure: Secondary | ICD-10-CM

## 2019-11-18 LAB — BASIC METABOLIC PANEL
Anion gap: 10 (ref 5–15)
BUN: 20 mg/dL (ref 8–23)
CO2: 22 mmol/L (ref 22–32)
Calcium: 8.6 mg/dL — ABNORMAL LOW (ref 8.9–10.3)
Chloride: 103 mmol/L (ref 98–111)
Creatinine, Ser: 0.9 mg/dL (ref 0.44–1.00)
GFR, Estimated: >60 mL/min (ref >60)
Glucose, Bld: 114 mg/dL — ABNORMAL HIGH (ref 70–99)
Potassium: 4.8 mmol/L (ref 3.5–5.1)
Sodium: 135 mmol/L (ref 135–145)

## 2019-11-18 LAB — CBC
HCT: 29.8 % — ABNORMAL LOW (ref 36.0–46.0)
Hemoglobin: 9.3 g/dL — ABNORMAL LOW (ref 12.0–15.0)
MCH: 23.5 pg — ABNORMAL LOW (ref 26.0–34.0)
MCHC: 31.2 g/dL (ref 30.0–36.0)
MCV: 75.3 fL — ABNORMAL LOW (ref 80.0–100.0)
Platelets: 266 10*3/uL (ref 150–400)
RBC: 3.96 MIL/uL (ref 3.87–5.11)
RDW: 18.5 % — ABNORMAL HIGH (ref 11.5–15.5)
WBC: 7.6 10*3/uL (ref 4.0–10.5)
nRBC: 0 % (ref 0.0–0.2)

## 2019-11-18 LAB — ECHOCARDIOGRAM COMPLETE
Area-P 1/2: 4.31 cm2
Height: 62 in
MV M vel: 5.41 m/s
MV Peak grad: 117.1 mmHg
P 1/2 time: 770 msec
Radius: 0.5 cm
S' Lateral: 4.19 cm
Weight: 2833.6 oz

## 2019-11-18 LAB — T3: T3, Total: 102 ng/dL (ref 71–180)

## 2019-11-18 LAB — MAGNESIUM: Magnesium: 1.5 mg/dL — ABNORMAL LOW (ref 1.7–2.4)

## 2019-11-18 MED ORDER — FERROUS SULFATE 325 (65 FE) MG PO TABS: 325.0000 mg | ORAL_TABLET | Freq: Every day | ORAL | Status: DC

## 2019-11-18 MED ORDER — FERROUS SULFATE 325 (65 FE) MG PO TABS
325.0000 mg | ORAL_TABLET | Freq: Every day | ORAL | Status: DC
  Administered 2019-11-18 – 2019-11-21 (×4): 325 mg via ORAL
  Filled 2019-11-18 (×5): qty 1

## 2019-11-18 MED ORDER — MAGNESIUM SULFATE 2 GM/50ML IV SOLN
2.0000 g | Freq: Once | INTRAVENOUS | Status: AC
  Administered 2019-11-18: 2 g via INTRAVENOUS
  Filled 2019-11-18: qty 50
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
## 2019-11-18 NOTE — Progress Notes (Signed)
Patient has been complaining of being SOB and unable to sleep. She states she gets more SOB when she ambulates. She is sating at 100 % RA, oxygen placed for comfort, but she takes it off. Patient is very anxious, but she is reluctant to take antianxiety medication stated "I am taking too many medications here, more than I take at home". Denies chest pain or discomfort.  On call Dr. Cyd Silence made aware; no new orders,just to monitor patient and notify him of any changes. Will continue to monitor patient.

## 2019-11-18 NOTE — Progress Notes (Signed)
  Echocardiogram 2D Echocardiogram has been performed.  Donna Mayer 11/18/2019, 9:54 AM

## 2019-11-18 NOTE — Progress Notes (Signed)
PROGRESS NOTE    Donna Mayer  WCH:[id number] DOB: Jun 11, 1955 DOA: 11/16/2019 PCP: [name], MD   Brief Narrative: -year-old with past medical history significant for Graves' disease, depression, hypertension, iron deficiency anemia who presents to the emergency department complaining of worsening shortness of breath, chest tightness, lower extremity edema and orthopnea.  All the symptoms started months ago but has been getting progressively worse over the last few days.  She stopped taking methimazole for several days but lately she has been taking it twice a day.   Evaluation in the ED patient was found to be in acute heart failure with tachypnea, oxygen saturation in the 90s, chest x-ray with findings concerning with CHF, BNP elevated 2637, patient received IV Lasix in the ED.    Assessment & Plan:   Principal Problem:   Acute CHF (congestive heart failure) (HCC) Active Problems:   Hyperthyroidism   IDA (iron deficiency anemia)   Depression   Hypertensive urgency   Elevated troponin  1-Acute CHF: Present with dyspnea, chest x ray finding CHF. Elevated BNP Component of hyperthyroidism. Echo with low Ef 45 % hypokinesis, MV regurgitation. Will consult cardiology/.  Continue with IV Lasix 40 mg IV twice daily. Strict I's and O's and daily weight Weight 187--177 No accurate I and O    2-Hyperthyroidism: Uncontrolled, patient presented with tachycardia, TSH low at 0.010, free T 4 at 2 Continue with increase dose of  methimazole 10 mg TID.  Continue with inderal 60 mg BID.  RH better controlled.   3-Hypomagnesemia; Replete IV dose today.   4-Mild elevation of troponin.  Related to CHF.   ECHO. Ef 45 %, hypokinesis. Cardiology consult.  EKG; prolong QT, replacing mg.   5-HTN; Urgency, BP 211/112 in the ED Continue with clonidine, change to daily, SBP decreasing,.  Continue with irbesartan.  On IV lasix and inderal.   6-Iron deficiency anemia Hb stable.  Resume  iron  Estimated body mass index is 32.39 kg/m as calculated from the following:   Height as of this encounter: 5' 2"  (1.575 m).   Weight as of this encounter: 80.3 kg.   DVT prophylaxis: Lovenox Code Status: Full code Family Communication: Discussed with patient.  Disposition Plan:  Status is: Inpatient  Remains inpatient appropriate because:IV treatments appropriate due to intensity of illness or inability to take PO   Dispo: The patient is from: Home              Anticipated d/c is to: Home              Anticipated d/c date is: 2 days              Patient currently is not medically stable to d/c.        Consultants:   Cardiology   Procedures:   ECHO Ef 45 % hypokinesis inferior , inferolateral wall.   Antimicrobials:  none  Subjective: She is not able to lying down flat yet. cough has improved.   Objective: Vitals:   11/17/19 2158 11/18/19 0002 11/18/19 0447 11/18/19 0837  BP:  121/85 137/73 135/77  Pulse: 84  78 79  Resp:  18 18   Temp:  98.1 F (36.7 C)    TempSrc:  Oral    SpO2:  100% 100%   Weight:   80.3 kg   Height:        Intake/Output Summary (Last 24 hours) at 11/18/2019 1453 Last data filed at 11/18/2019 1300 Gross per 24 hour  Intake 483 ml  Output 1020 ml  Net -537 ml   Filed Weights   11/16/19 1945 11/17/19 1330 11/18/19 0447  Weight: 85 kg 80.3 kg 80.3 kg    Examination:  General exam: NAD Respiratory system: Bilateral crackles.  Cardiovascular system: S 1, S 2 RRR. Gastrointestinal system: BS present , soft, nt Central nervous system: alert  Extremities: plus 1 edema    Data Reviewed: I have personally reviewed following labs and imaging studies  CBC: Recent Labs  Lab 11/16/19 1430 11/16/19 1956 11/18/19 0837  WBC 6.6 7.1 7.6  NEUTROABS 4.7 5.0  --   HGB 8.9* 8.9* 9.3*  HCT 28.4* 29.1* 29.8*  MCV 78* 76.8* 75.3*  PLT 245 249 277   Basic Metabolic Panel: Recent Labs  Lab 11/16/19 1430 11/16/19 1956  11/17/19 0343 11/18/19 0113 11/18/19 0837  NA 143 137 138 135  --   K 4.0 3.3* 3.6 4.8  --   CL 106 104 104 103  --   CO2 23 24 23 22   --   GLUCOSE 98 122* 94 114*  --   BUN 14 12 9 20   --   CREATININE 0.47* 0.40* 0.40* 0.90  --   CALCIUM 8.3* 8.3* 8.4* 8.6*  --   MG  --   --  1.1*  --  1.5*   GFR: Estimated Creatinine Clearance: 63.6 mL/min (by C-G formula based on SCr of 0.9 mg/dL). Liver Function Tests: Recent Labs  Lab 11/16/19 1430  AST 23  ALT 9  ALKPHOS 185*  BILITOT 1.8*  PROT 6.9  ALBUMIN 3.3*   No results for input(s): LIPASE, AMYLASE in the last 168 hours. No results for input(s): AMMONIA in the last 168 hours. Coagulation Profile: Recent Labs  Lab 11/16/19 1956  INR 1.4*   Cardiac Enzymes: No results for input(s): CKTOTAL, CKMB, CKMBINDEX, TROPONINI in the last 168 hours. BNP (last 3 results) Recent Labs    11/16/19 1524  PROBNP 8,535*   HbA1C: Recent Labs    11/16/19 1430  HGBA1C 5.9*   CBG: No results for input(s): GLUCAP in the last 168 hours. Lipid Profile: Recent Labs    11/16/19 1430  CHOL 114  HDL 29*  LDLCALC 71  TRIG 62  CHOLHDL 3.9   Thyroid Function Tests: Recent Labs    11/16/19 2205  TSH <0.010*  FREET4 2.00*   Anemia Panel: No results for input(s): VITAMINB12, FOLATE, FERRITIN, TIBC, IRON, RETICCTPCT in the last 72 hours. Sepsis Labs: No results for input(s): PROCALCITON, LATICACIDVEN in the last 168 hours.  Recent Results (from the past 240 hour(s))  Respiratory Panel by RT PCR (Flu A&B, Covid) - Nasopharyngeal Swab     Status: None   Collection Time: 11/16/19 10:09 PM   Specimen: Nasopharyngeal Swab  Result Value Ref Range Status   SARS Coronavirus 2 by RT PCR NEGATIVE NEGATIVE Final    Comment: (NOTE) SARS-CoV-2 target nucleic acids are NOT DETECTED.  The SARS-CoV-2 RNA is generally detectable in upper respiratoy specimens during the acute phase of infection. The lowest concentration of SARS-CoV-2 viral  copies this assay can detect is 131 copies/mL. A negative result does not preclude SARS-Cov-2 infection and should not be used as the sole basis for treatment or other patient management decisions. A negative result may occur with  improper specimen collection/handling, submission of specimen other than nasopharyngeal swab, presence of viral mutation(s) within the areas targeted by this assay, and inadequate number of viral copies (<131 copies/mL). A negative  result must be combined with clinical observations, patient history, and epidemiological information. The expected result is Negative.  Fact Sheet for Patients:  PinkCheek.be  Fact Sheet for Healthcare Providers:  GravelBags.it  This test is no t yet approved or cleared by the [place] FDA and  has been authorized for detection and/or diagnosis of SARS-CoV-2 by FDA under an Emergency Use Authorization (EUA). This EUA will remain  in effect (meaning this test can be used) for the duration of the COVID-19 declaration under Section 564(b)(1) of the Act, 21 U.S.C. section 360bbb-3(b)(1), unless the authorization is terminated or revoked sooner.     Influenza A by PCR NEGATIVE NEGATIVE Final   Influenza B by PCR NEGATIVE NEGATIVE Final    Comment: (NOTE) The Xpert Xpress SARS-CoV-2/FLU/RSV assay is intended as an aid in  the diagnosis of influenza from Nasopharyngeal swab specimens and  should not be used as a sole basis for treatment. Nasal washings and  aspirates are unacceptable for Xpert Xpress SARS-CoV-2/FLU/RSV  testing.  Fact Sheet for Patients: PinkCheek.be  Fact Sheet for Healthcare Providers: GravelBags.it  This test is not yet approved or cleared by the [place] FDA and  has been authorized for detection and/or diagnosis of SARS-CoV-2 by  FDA under an Emergency Use Authorization (EUA). This  EUA will remain  in effect (meaning this test can be used) for the duration of the  Covid-19 declaration under Section 564(b)(1) of the Act, 21  U.S.C. section 360bbb-3(b)(1), unless the authorization is  terminated or revoked. Performed at [facility], [place] [address], [place], [place] [address]          Radiology Studies: DG Chest 2 View  Result Date: 11/16/2019 CLINICAL DATA:  Shortness of breath and chest tightness EXAM: CHEST - 2 VIEW COMPARISON:  05/24/2019 FINDINGS: Cardiac shadow is enlarged but accentuated by the frontal technique. Vascular congestion is noted with mild interstitial edema. Right basilar atelectasis is noted as well. No sizable effusion is seen. Gastric lap band is noted. No bony abnormality is seen. IMPRESSION: Changes of mild CHF. Electronically Signed   By: Inez Catalina M.D.   On: 11/16/2019 20:22   ECHOCARDIOGRAM COMPLETE  Result Date: 11/18/2019    ECHOCARDIOGRAM REPORT   Patient Name:   ZALEA PETE Date of Exam: 11/18/2019 Medical Rec #:  [id number]      Height:       62.0 in Accession #:    [id number]     Weight:       177.1 lb Date of Birth:  1955-08-02      BSA:          1.815 m Patient Age:    24 years       BP:           135/77 mmHg Patient Gender: F              HR:           75 bpm. Exam Location:  Inpatient Procedure: 2D Echo, Cardiac Doppler and Color Doppler Indications:    I50.33 Acute on chronic diastolic (congestive) heart failure;                 Elevated Troponin.  History:        Patient has no prior history of Echocardiogram examinations.                 Signs/Symptoms:Murmur; Risk Factors:Dyslipidemia and  Hypertension. Graves Disease. GERD.  Sonographer:    Jonelle Sidle Dance Referring Phys: [id number] [place]  1. There is hypokinesis of the inferior, inferolateral walls . Left ventricular ejection fraction, by estimation, is 40 to 45%. The left ventricle has mildly decreased function. The left ventricle  demonstrates regional wall motion abnormalities (see scoring diagram/findings for description). There is mild left ventricular hypertrophy. Left ventricular diastolic parameters are consistent with Grade I diastolic dysfunction (impaired relaxation).  2. Right ventricular systolic function is normal. The right ventricular size is normal. There is normal pulmonary artery systolic pressure.  3. Left atrial size was moderately dilated.  4. Right atrial size was moderately dilated.  5. There appear to be a couple jets of MR making quantificaiton of severity difficult     The predeminant jets are posteriorly and central. MR is at least moderately severe to severe . COnsider TEE to further define valve function. .Moderate to severe mitral valve regurgitation.  6. Tricuspid valve regurgitation is moderate.  7. The aortic valve is normal in structure. Aortic valve regurgitation is mild.  8. The inferior vena cava is dilated in size with <50% respiratory variability, suggesting right atrial pressure of 15 mmHg. FINDINGS  Left Ventricle: There is hypokinesis of the inferior, inferolateral walls. Left ventricular ejection fraction, by estimation, is 40 to 45%. The left ventricle has mildly decreased function. The left ventricle demonstrates regional wall motion abnormalities. The left ventricular internal cavity size was normal in size. There is mild left ventricular hypertrophy. Left ventricular diastolic parameters are consistent with Grade I diastolic dysfunction (impaired relaxation). Right Ventricle: The right ventricular size is normal. Right vetricular wall thickness was not assessed. Right ventricular systolic function is normal. There is normal pulmonary artery systolic pressure. The tricuspid regurgitant velocity is 2.11 m/s, and with an assumed right atrial pressure of 15 mmHg, the estimated right ventricular systolic pressure is 60.7 mmHg. Left Atrium: Left atrial size was moderately dilated. Right Atrium: Right  atrial size was moderately dilated. Pericardium: Trivial pericardial effusion is present. Mitral Valve: There appear to be a couple jets of MR making quantificaiton of severity difficult The predeminant jets are posteriorly and central. MR is at least moderately severe to severe . COnsider TEE to further define valve function. The mitral valve is abnormal. There is mild thickening of the mitral valve leaflet(s). Moderate to severe mitral  valve regurgitation. Tricuspid Valve: The tricuspid valve is normal in structure. Tricuspid valve regurgitation is moderate. Aortic Valve: The aortic valve is normal in structure. Aortic valve regurgitation is mild. Aortic regurgitation PHT measures 770 msec. Pulmonic Valve: The pulmonic valve was normal in structure. Pulmonic valve regurgitation is not visualized. Aorta: The aortic root and ascending aorta are structurally normal, with no evidence of dilitation. Venous: The inferior vena cava is dilated in size with less than 50% respiratory variability, suggesting right atrial pressure of 15 mmHg. IAS/Shunts: No atrial level shunt detected by color flow Doppler.  LEFT VENTRICLE PLAX 2D LVIDd:         5.34 cm  Diastology LVIDs:         4.19 cm  LV e' medial:    5.22 cm/s LV PW:         1.36 cm  LV E/e' medial:  11.5 LV IVS:        1.18 cm  LV e' lateral:   5.33 cm/s LVOT diam:     2.10 cm  LV E/e' lateral: 11.3 LV SV:  44 LV SV Index:   24 LVOT Area:     3.46 cm  RIGHT VENTRICLE             IVC RV Basal diam:  3.64 cm     IVC diam: 2.50 cm RV Mid diam:    2.05 cm RV S prime:     10.10 cm/s TAPSE (M-mode): 1.9 cm LEFT ATRIUM             Index       RIGHT ATRIUM           Index LA diam:        5.50 cm 3.03 cm/m  RA Area:     25.30 cm LA Vol (A2C):   77.8 ml 42.86 ml/m RA Volume:   93.40 ml  51.45 ml/m LA Vol (A4C):   67.5 ml 37.18 ml/m LA Biplane Vol: 76.6 ml 42.19 ml/m  AORTIC VALVE LVOT Vmax:   78.30 cm/s LVOT Vmean:  53.800 cm/s LVOT VTI:    0.126 m AI PHT:      770  msec  AORTA Ao Root diam: 3.10 cm Ao Asc diam:  3.80 cm MITRAL VALVE                 TRICUSPID VALVE MV Area (PHT): 4.31 cm      TR Peak grad:   17.8 mmHg MV Decel Time: 176 msec      TR Vmax:        211.00 cm/s MR Peak grad:    117.1 mmHg MR Mean grad:    77.0 mmHg   SHUNTS MR Vmax:         541.00 cm/s Systemic VTI:  0.13 m MR Vmean:        413.0 cm/s  Systemic Diam: 2.10 cm MR PISA:         1.57 cm MR PISA Eff ROA: 12 mm MR PISA Radius:  0.50 cm MV E velocity: 60.10 cm/s MV A velocity: 56.30 cm/s MV E/A ratio:  1.07 [name] MD Electronically signed by [name] MD Signature Date/Time: 11/18/2019/1:44:52 PM    Final         Scheduled Meds: . cloNIDine  0.1 mg Oral Daily  . enoxaparin (LOVENOX) injection  40 mg Subcutaneous Daily  . furosemide  40 mg Intravenous Q12H  . irbesartan  75 mg Oral Daily  . methimazole  10 mg Oral TID  . pantoprazole  40 mg Oral BID  . propranolol  60 mg Oral BID  . sodium chloride flush  3 mL Intravenous Q12H   Continuous Infusions: . sodium chloride    . magnesium sulfate bolus IVPB       LOS: 2 days    Time spent: 35 minutes.     Elmarie Shiley, MD [facility]   If 7PM-7AM, please contact night-coverage [email or web address]  11/18/2019, 2:53 PM

## 2019-11-19 DIAGNOSIS — I5020 Unspecified systolic (congestive) heart failure: Secondary | ICD-10-CM | POA: Diagnosis not present

## 2019-11-19 DIAGNOSIS — E0590 Thyrotoxicosis, unspecified without thyrotoxic crisis or storm: Secondary | ICD-10-CM

## 2019-11-19 LAB — BASIC METABOLIC PANEL
Anion gap: 11 (ref 5–15)
BUN: 27 mg/dL — ABNORMAL HIGH (ref 8–23)
CO2: 22 mmol/L (ref 22–32)
Calcium: 8.4 mg/dL — ABNORMAL LOW (ref 8.9–10.3)
Chloride: 103 mmol/L (ref 98–111)
Creatinine, Ser: 0.94 mg/dL (ref 0.44–1.00)
GFR, Estimated: >60 mL/min (ref >60)
Glucose, Bld: 106 mg/dL — ABNORMAL HIGH (ref 70–99)
Potassium: 4.1 mmol/L (ref 3.5–5.1)
Sodium: 136 mmol/L (ref 135–145)

## 2019-11-19 MED ORDER — MAGNESIUM SULFATE 2 GM/50ML IV SOLN
2.0000 g | Freq: Once | INTRAVENOUS | Status: AC
  Administered 2019-11-19: 2 g via INTRAVENOUS
  Filled 2019-11-19: qty 50

## 2019-11-19 MED ORDER — SODIUM CHLORIDE 0.9 % IV SOLN
1.0000 g | INTRAVENOUS | Status: DC
  Administered 2019-11-19 – 2019-11-20 (×2): 1 g via INTRAVENOUS
  Filled 2019-11-19 (×2): qty 10

## 2019-11-19 MED ORDER — FUROSEMIDE 10 MG/ML IJ SOLN
40.0000 mg | Freq: Three times a day (TID) | INTRAMUSCULAR | Status: DC
  Administered 2019-11-19 – 2019-11-20 (×4): 40 mg via INTRAVENOUS
  Filled 2019-11-19 (×3): qty 4

## 2019-11-19 MED ORDER — POTASSIUM CHLORIDE CRYS ER 20 MEQ PO TBCR
40.0000 meq | EXTENDED_RELEASE_TABLET | Freq: Once | ORAL | Status: AC
  Administered 2019-11-19: 40 meq via ORAL
  Filled 2019-11-19: qty 2

## 2019-11-19 NOTE — Progress Notes (Signed)
Patient has been anxious and angry at staff the entire shift.. She refused lab draw and accused phlebotomist of coming to her room without knocking and being loud; phlebotomist denies doing so. At the beginning of the shift she requested not to have her Ambien with the rest of her medications. RN told patient to call when she was ready for the Ambien, but she called her daughter instead and complained that the writer did not bring her Ambien. The daughter called the unit and complained. At this time the writer was assisting another patient so the charge nurse went to give her Ambien. Please see her note about her encounter with the patient. She complained when the fire alarm went off and the writer explained to her why she closed her door. She wanted the door to be opened within a few minutes, but the writer told her that the fire alarm has to be cleared before the door can be opened. Also complained that she saw staff just sitting and talking loud, that her BSC was not emptied.  BSC was checked prior to giving her Lasix, she voided 200 cc emptied by charge nurse when she went to give her Ambien. She becomes agitated when staff rounds to check on her, and then complains that no one comes to her room. She claims she has been taking care of herself and no one has helped her. She promised to report the Probation officer along with other staff members to management. It has been very difficult to please this patient. She is anxious and angry all the time.

## 2019-11-19 NOTE — Progress Notes (Signed)
Progress Note  Patient Name: Donna Mayer Date of Encounter: 11/19/2019  Primary Cardiologist: Bettina Gavia (patient reports) otherwise Moorea Boissonneault  Subjective   64 y.o. female with a hx of Graves dz, HTN, HLD, hyperthyroid w/ thyroidectomy scheduled 11/24, IDA, depression, palpitations with presented with new cardiomyopathy and moderate aortic insufficiency, moderate to severe mitral regurgitation; planned thyroidectomy 12/07/19.  Patient notes a difficult time overnight.  Out at least 1 L.  Breathing has improved.  Inpatient Medications    Scheduled Meds: . cloNIDine  0.1 mg Oral Daily  . enoxaparin (LOVENOX) injection  40 mg Subcutaneous Daily  . ferrous sulfate  325 mg Oral Q breakfast  . furosemide  40 mg Intravenous Q8H  . irbesartan  75 mg Oral Daily  . methimazole  10 mg Oral TID  . pantoprazole  40 mg Oral BID  . propranolol  60 mg Oral BID  . sodium chloride flush  3 mL Intravenous Q12H   Continuous Infusions: . sodium chloride     PRN Meds: sodium chloride, acetaminophen, ondansetron (ZOFRAN) IV, sodium chloride flush, zolpidem   Vital Signs    Vitals:   11/18/19 1830 11/18/19 2141 11/19/19 0626 11/19/19 0751  BP: 108/78 (!) 122/98 (!) 139/93 (!) 153/99  Pulse: 72 70 85 75  Resp: 18 17 18 18   Temp: (!) 97.5 F (36.4 C) 97.6 F (36.4 C) 97.9 F (36.6 C) 98.2 F (36.8 C)  TempSrc: Oral Oral Oral Oral  SpO2:   98% 98%  Weight:   80 kg   Height:        Intake/Output Summary (Last 24 hours) at 11/19/2019 1052 Last data filed at 11/19/2019 0653 Gross per 24 hour  Intake 120 ml  Output 1070 ml  Net -950 ml   Filed Weights   11/17/19 1330 11/18/19 0447 11/19/19 0626  Weight: 80.3 kg 80.3 kg 80 kg    Telemetry    Sr occasional PVC - Personally Reviewed  ECG    No new - Personally Reviewed  Physical Exam   GEN: Gravers Facies  Neck: Improved JVD Cardiac: RRR, systolic and diastolic murmurs rubs, or gallops.  Respiratory: Clear to auscultation  bilaterally. GI: Soft, nontender, non-distended  MS: +1 edema; No deformity. Neuro:  Nonfocal  Psych: Normal affect   Labs    Chemistry Recent Labs  Lab 11/16/19 1430 11/16/19 1956 11/17/19 0343 11/18/19 0113 11/19/19 0713  NA 143   < > 138 135 136  K 4.0   < > 3.6 4.8 4.1  CL 106   < > 104 103 103  CO2 23   < > 23 22 22   GLUCOSE 98   < > 94 114* 106*  BUN 14   < > 9 20 27*  CREATININE 0.47*   < > 0.40* 0.90 0.94  CALCIUM 8.3*   < > 8.4* 8.6* 8.4*  PROT 6.9  --   --   --   --   ALBUMIN 3.3*  --   --   --   --   AST 23  --   --   --   --   ALT 9  --   --   --   --   ALKPHOS 185*  --   --   --   --   BILITOT 1.8*  --   --   --   --   GFRNONAA 106   < > >60 >60 >60  GFRAA 122  --   --   --   --  ANIONGAP  --    < > 11 10 11    < > = values in this interval not displayed.     Hematology Recent Labs  Lab 11/16/19 1430 11/16/19 1956 11/18/19 0837  WBC 6.6 7.1 7.6  RBC 3.64* 3.79* 3.96  HGB 8.9* 8.9* 9.3*  HCT 28.4* 29.1* 29.8*  MCV 78* 76.8* 75.3*  MCH 24.5* 23.5* 23.5*  MCHC 31.3* 30.6 31.2  RDW 16.6* 18.6* 18.5*  PLT 245 249 266    BNP Recent Labs  Lab 11/16/19 1524 11/16/19 1956  BNP  --  2,637.8*  PROBNP 8,535*  --      DDimer No results for input(s): DDIMER in the last 168 hours.   Radiology    ECHOCARDIOGRAM COMPLETE  Result Date: 11/18/2019    ECHOCARDIOGRAM REPORT   Patient Name:   Donna Mayer Date of Exam: 11/18/2019 Medical Rec #:  [id number]      Height:       62.0 in Accession #:    [id number]     Weight:       177.1 lb Date of Birth:  [birth year]      BSA:          1.815 m Patient Age:    5 years       BP:           135/77 mmHg Patient Gender: F              HR:           75 bpm. Exam Location:  Inpatient Procedure: 2D Echo, Cardiac Doppler and Color Doppler Indications:    I50.33 Acute on chronic diastolic (congestive) heart failure;                 Elevated Troponin.  History:        Patient has no prior history of Echocardiogram  examinations.                 Signs/Symptoms:Murmur; Risk Factors:Dyslipidemia and                 Hypertension. Graves Disease. GERD.  Sonographer:    Jonelle Sidle Dance Referring Phys: [id number] [place]  1. There is hypokinesis of the inferior, inferolateral walls . Left ventricular ejection fraction, by estimation, is 40 to 45%. The left ventricle has mildly decreased function. The left ventricle demonstrates regional wall motion abnormalities (see scoring diagram/findings for description). There is mild left ventricular hypertrophy. Left ventricular diastolic parameters are consistent with Grade I diastolic dysfunction (impaired relaxation).  2. Right ventricular systolic function is normal. The right ventricular size is normal. There is normal pulmonary artery systolic pressure.  3. Left atrial size was moderately dilated.  4. Right atrial size was moderately dilated.  5. There appear to be a couple jets of MR making quantificaiton of severity difficult     The predeminant jets are posteriorly and central. MR is at least moderately severe to severe . COnsider TEE to further define valve function. .Moderate to severe mitral valve regurgitation.  6. Tricuspid valve regurgitation is moderate.  7. The aortic valve is normal in structure. Aortic valve regurgitation is mild.  8. The inferior vena cava is dilated in size with <50% respiratory variability, suggesting right atrial pressure of 15 mmHg. FINDINGS  Left Ventricle: There is hypokinesis of the inferior, inferolateral walls. Left ventricular ejection fraction, by estimation, is 40 to 45%. The left ventricle has mildly decreased function. The left ventricle  demonstrates regional wall motion abnormalities. The left ventricular internal cavity size was normal in size. There is mild left ventricular hypertrophy. Left ventricular diastolic parameters are consistent with Grade I diastolic dysfunction (impaired relaxation). Right Ventricle: The right  ventricular size is normal. Right vetricular wall thickness was not assessed. Right ventricular systolic function is normal. There is normal pulmonary artery systolic pressure. The tricuspid regurgitant velocity is 2.11 m/s, and with an assumed right atrial pressure of 15 mmHg, the estimated right ventricular systolic pressure is 63.8 mmHg. Left Atrium: Left atrial size was moderately dilated. Right Atrium: Right atrial size was moderately dilated. Pericardium: Trivial pericardial effusion is present. Mitral Valve: There appear to be a couple jets of MR making quantificaiton of severity difficult The predeminant jets are posteriorly and central. MR is at least moderately severe to severe . COnsider TEE to further define valve function. The mitral valve is abnormal. There is mild thickening of the mitral valve leaflet(s). Moderate to severe mitral  valve regurgitation. Tricuspid Valve: The tricuspid valve is normal in structure. Tricuspid valve regurgitation is moderate. Aortic Valve: The aortic valve is normal in structure. Aortic valve regurgitation is mild. Aortic regurgitation PHT measures 770 msec. Pulmonic Valve: The pulmonic valve was normal in structure. Pulmonic valve regurgitation is not visualized. Aorta: The aortic root and ascending aorta are structurally normal, with no evidence of dilitation. Venous: The inferior vena cava is dilated in size with less than 50% respiratory variability, suggesting right atrial pressure of 15 mmHg. IAS/Shunts: No atrial level shunt detected by color flow Doppler.  LEFT VENTRICLE PLAX 2D LVIDd:         5.34 cm  Diastology LVIDs:         4.19 cm  LV e' medial:    5.22 cm/s LV PW:         1.36 cm  LV E/e' medial:  11.5 LV IVS:        1.18 cm  LV e' lateral:   5.33 cm/s LVOT diam:     2.10 cm  LV E/e' lateral: 11.3 LV SV:         44 LV SV Index:   24 LVOT Area:     3.46 cm  RIGHT VENTRICLE             IVC RV Basal diam:  3.64 cm     IVC diam: 2.50 cm RV Mid diam:    2.05 cm  RV S prime:     10.10 cm/s TAPSE (M-mode): 1.9 cm LEFT ATRIUM             Index       RIGHT ATRIUM           Index LA diam:        5.50 cm 3.03 cm/m  RA Area:     25.30 cm LA Vol (A2C):   77.8 ml 42.86 ml/m RA Volume:   93.40 ml  51.45 ml/m LA Vol (A4C):   67.5 ml 37.18 ml/m LA Biplane Vol: 76.6 ml 42.19 ml/m  AORTIC VALVE LVOT Vmax:   78.30 cm/s LVOT Vmean:  53.800 cm/s LVOT VTI:    0.126 m AI PHT:      770 msec  AORTA Ao Root diam: 3.10 cm Ao Asc diam:  3.80 cm MITRAL VALVE                 TRICUSPID VALVE MV Area (PHT): 4.31 cm      TR Peak grad:  17.8 mmHg MV Decel Time: 176 msec      TR Vmax:        211.00 cm/s MR Peak grad:    117.1 mmHg MR Mean grad:    77.0 mmHg   SHUNTS MR Vmax:         541.00 cm/s Systemic VTI:  0.13 m MR Vmean:        413.0 cm/s  Systemic Diam: 2.10 cm MR PISA:         1.57 cm MR PISA Eff ROA: 12 mm MR PISA Radius:  0.50 cm MV E velocity: 60.10 cm/s MV A velocity: 56.30 cm/s MV E/A ratio:  1.07 [name] MD Electronically signed by [name] MD Signature Date/Time: 11/18/2019/1:44:52 PM    Final     Cardiac Studies   No new  Patient Profile     64 y.o. female with mitral regurgitation, Aortic insufficiency, HFmrEF, mildly reduced EF in the setting of hyperthroidism  Assessment & Plan   HFmrEF Mitral Regurgitation  In the setting of hyperthyroidism HTN Hypomagnesemia - Continue Irbesartan and propranolol (post thyroidectomy, would prefer metoprolol succindate) - will continue IV diuresis - possible PO transition 11/20/19 - Reasonable for TEE 11/21/19 - At this time, deferring ischemic work up.  Mild Aortic dilation (Indexed to height 2.53) Moderate Aortic Insufficiency - diuresis as above, may benefit from euvolemic TEE  For questions or updates, please contact [place] Please consult [email or web address] for contact info under Cardiology/STEMI.      Signed, Werner Lean, MD  11/19/2019, 10:52 AM

## 2019-11-19 NOTE — Progress Notes (Signed)
PROGRESS NOTE    Donna Mayer  NWG:[id number] DOB: 04-16-1955 DOA: 11/16/2019 PCP: [name], MD   Brief Narrative: -64 year-old with past medical history significant for Graves' disease, depression, hypertension, iron deficiency anemia who presents to the emergency department complaining of worsening shortness of breath, chest tightness, lower extremity edema and orthopnea.  All the symptoms started months ago but has been getting progressively worse over the last few days.  She stopped taking methimazole for several days but lately she has been taking it twice a day.   Evaluation in the ED patient was found to be in acute heart failure with tachypnea, oxygen saturation in the 90s, chest x-ray with findings concerning with CHF, BNP elevated 2637, patient received IV Lasix in the ED.    Assessment & Plan:   Principal Problem:   Acute CHF (congestive heart failure) (HCC) Active Problems:   Hyperthyroidism   IDA (iron deficiency anemia)   Depression   Hypertensive urgency   Elevated troponin  1-Acute CHF: Present with dyspnea, chest x ray finding CHF. Elevated BNP Component of hyperthyroidism. Echo with low Ef 45 % hypokinesis, MV regurgitation. Cardiology consulted. Plan for TEE on Monday to evaluate valve.  Change lasix to TID.  Strict I's and O's and daily weight Weight 187--177--176 Urine out put yesterday 1.  L.   2-Hyperthyroidism: Uncontrolled, patient presented with tachycardia, TSH low at 0.010, free T 4 at 2 Continue with increase dose of  methimazole 10 mg TID.  Continue with inderal 60 mg BID.  RH better controlled.   3-Hypomagnesemia; replete IV and repeat mg in am.   4-Mild elevation of troponin.  Related to CHF.  ECHO. Ef 45 %, hypokinesis. Cardiology consult.  EKG; prolong QT, replacing mg.   5-HTN; Urgency, BP 211/112 in the ED Continue with clonidine, change to daily.  Continue with irbesartan.  On IV lasix and inderal.  Improved.   6-Iron  deficiency anemia Hb stable.  Resume iron  7-UTI;  UA with 6-10 WBC.  Symptomatic.  Urine growing 60,000 gram negative rods.  Started ceftriaxone.   Estimated body mass index is 32.25 kg/m as calculated from the following:   Height as of this encounter: 5' 2"  (1.575 m).   Weight as of this encounter: 80 kg.   DVT prophylaxis: Lovenox Code Status: Full code Family Communication: Discussed with patient.  Disposition Plan:  Status is: Inpatient  Remains inpatient appropriate because:IV treatments appropriate due to intensity of illness or inability to take PO   Dispo: The patient is from: Home              Anticipated d/c is to: Home              Anticipated d/c date is: 2 days              Patient currently is not medically stable to d/c.        Consultants:   Cardiology   Procedures:   ECHO Ef 45 % hypokinesis inferior , inferolateral wall.   Antimicrobials:  none  Subjective: She had difficult night.  She is breathing better.  Denies chest pain.   Objective: Vitals:   11/18/19 1830 11/18/19 2141 11/19/19 0626 11/19/19 0751  BP: 108/78 (!) 122/98 (!) 139/93 (!) 153/99  Pulse: 72 70 85 75  Resp: 18 17 18 18   Temp: (!) 97.5 F (36.4 C) 97.6 F (36.4 C) 97.9 F (36.6 C) 98.2 F (36.8 C)  TempSrc: Oral Oral Oral Oral  SpO2:   98% 98%  Weight:   80 kg   Height:        Intake/Output Summary (Last 24 hours) at 11/19/2019 1221 Last data filed at 11/19/2019 0653 Gross per 24 hour  Intake 120 ml  Output 1070 ml  Net -950 ml   Filed Weights   11/17/19 1330 11/18/19 0447 11/19/19 0626  Weight: 80.3 kg 80.3 kg 80 kg    Examination:  General exam: NAD Respiratory system: Bilateral crackles.  Cardiovascular system: S 1, S 2 RRR, systolic murmur. Positive JVD Gastrointestinal system: BS present, soft, nt Central nervous system: Alert Extremities: Plus 1 edema    Data Reviewed: I have personally reviewed following labs and imaging  studies  CBC: Recent Labs  Lab 11/16/19 1430 11/16/19 1956 11/18/19 0837  WBC 6.6 7.1 7.6  NEUTROABS 4.7 5.0  --   HGB 8.9* 8.9* 9.3*  HCT 28.4* 29.1* 29.8*  MCV 78* 76.8* 75.3*  PLT 245 249 591   Basic Metabolic Panel: Recent Labs  Lab 11/16/19 1430 11/16/19 1956 11/17/19 0343 11/18/19 0113 11/18/19 0837 11/19/19 0713  NA 143 137 138 135  --  136  K 4.0 3.3* 3.6 4.8  --  4.1  CL 106 104 104 103  --  103  CO2 23 24 23 22   --  22  GLUCOSE 98 122* 94 114*  --  106*  BUN 14 12 9 20   --  27*  CREATININE 0.47* 0.40* 0.40* 0.90  --  0.94  CALCIUM 8.3* 8.3* 8.4* 8.6*  --  8.4*  MG  --   --  1.1*  --  1.5*  --    GFR: Estimated Creatinine Clearance: 60.8 mL/min (by C-G formula based on SCr of 0.94 mg/dL). Liver Function Tests: Recent Labs  Lab 11/16/19 1430  AST 23  ALT 9  ALKPHOS 185*  BILITOT 1.8*  PROT 6.9  ALBUMIN 3.3*   No results for input(s): LIPASE, AMYLASE in the last 168 hours. No results for input(s): AMMONIA in the last 168 hours. Coagulation Profile: Recent Labs  Lab 11/16/19 1956  INR 1.4*   Cardiac Enzymes: No results for input(s): CKTOTAL, CKMB, CKMBINDEX, TROPONINI in the last 168 hours. BNP (last 3 results) Recent Labs    11/16/19 1524  PROBNP 8,535*   HbA1C: Recent Labs    11/16/19 1430  HGBA1C 5.9*   CBG: No results for input(s): GLUCAP in the last 168 hours. Lipid Profile: Recent Labs    11/16/19 1430  CHOL 114  HDL 29*  LDLCALC 71  TRIG 62  CHOLHDL 3.9   Thyroid Function Tests: Recent Labs    11/16/19 2205  TSH <0.010*  FREET4 2.00*   Anemia Panel: No results for input(s): VITAMINB12, FOLATE, FERRITIN, TIBC, IRON, RETICCTPCT in the last 72 hours. Sepsis Labs: No results for input(s): PROCALCITON, LATICACIDVEN in the last 168 hours.  Recent Results (from the past 240 hour(s))  Respiratory Panel by RT PCR (Flu A&B, Covid) - Nasopharyngeal Swab     Status: None   Collection Time: 11/16/19 10:09 PM   Specimen:  Nasopharyngeal Swab  Result Value Ref Range Status   SARS Coronavirus 2 by RT PCR NEGATIVE NEGATIVE Final    Comment: (NOTE) SARS-CoV-2 target nucleic acids are NOT DETECTED.  The SARS-CoV-2 RNA is generally detectable in upper respiratoy specimens during the acute phase of infection. The lowest concentration of SARS-CoV-2 viral copies this assay can detect is 131 copies/mL. A negative result does not preclude SARS-Cov-2  infection and should not be used as the sole basis for treatment or other patient management decisions. A negative result may occur with  improper specimen collection/handling, submission of specimen other than nasopharyngeal swab, presence of viral mutation(s) within the areas targeted by this assay, and inadequate number of viral copies (<131 copies/mL). A negative result must be combined with clinical observations, patient history, and epidemiological information. The expected result is Negative.  Fact Sheet for Patients:  PinkCheek.be  Fact Sheet for Healthcare Providers:  GravelBags.it  This test is no t yet approved or cleared by the [place] FDA and  has been authorized for detection and/or diagnosis of SARS-CoV-2 by FDA under an Emergency Use Authorization (EUA). This EUA will remain  in effect (meaning this test can be used) for the duration of the COVID-19 declaration under Section 564(b)(1) of the Act, 21 U.S.C. section 360bbb-3(b)(1), unless the authorization is terminated or revoked sooner.     Influenza A by PCR NEGATIVE NEGATIVE Final   Influenza B by PCR NEGATIVE NEGATIVE Final    Comment: (NOTE) The Xpert Xpress SARS-CoV-2/FLU/RSV assay is intended as an aid in  the diagnosis of influenza from Nasopharyngeal swab specimens and  should not be used as a sole basis for treatment. Nasal washings and  aspirates are unacceptable for Xpert Xpress SARS-CoV-2/FLU/RSV  testing.  Fact Sheet  for Patients: PinkCheek.be  Fact Sheet for Healthcare Providers: GravelBags.it  This test is not yet approved or cleared by the [place] FDA and  has been authorized for detection and/or diagnosis of SARS-CoV-2 by  FDA under an Emergency Use Authorization (EUA). This EUA will remain  in effect (meaning this test can be used) for the duration of the  Covid-19 declaration under Section 564(b)(1) of the Act, 21  U.S.C. section 360bbb-3(b)(1), unless the authorization is  terminated or revoked. Performed at [facility], [place] [address], [place], [place] [address]   Urine Culture     Status: Abnormal (Preliminary result)   Collection Time: 11/18/19  7:53 AM   Specimen: Urine, Random  Result Value Ref Range Status   Specimen Description URINE, RANDOM  Final   Special Requests NONE  Final   Culture (A)  Final    60,000 COLONIES/mL GRAM NEGATIVE RODS SUSCEPTIBILITIES TO FOLLOW Performed at [facility], [place] [address], [place],  [address]    Report Status PENDING  Incomplete         Radiology Studies: ECHOCARDIOGRAM COMPLETE  Result Date: 11/18/2019    ECHOCARDIOGRAM REPORT   Patient Name:   Donna Mayer Date of Exam: 11/18/2019 Medical Rec #:  [id number]      Height:       62.0 in Accession #:    [id number]     Weight:       177.1 lb Date of Birth:  [birth year]      BSA:          1.815 m Patient Age:    81 years       BP:           135/77 mmHg Patient Gender: F              HR:           75 bpm. Exam Location:  Inpatient Procedure: 2D Echo, Cardiac Doppler and Color Doppler Indications:    I50.33 Acute on chronic diastolic (congestive) heart failure;  Elevated Troponin.  History:        Patient has no prior history of Echocardiogram examinations.                 Signs/Symptoms:Murmur; Risk Factors:Dyslipidemia and                 Hypertension. Graves Disease. GERD.  Sonographer:    Jonelle Sidle  Dance Referring Phys: [id number] [place]  1. There is hypokinesis of the inferior, inferolateral walls . Left ventricular ejection fraction, by estimation, is 40 to 45%. The left ventricle has mildly decreased function. The left ventricle demonstrates regional wall motion abnormalities (see scoring diagram/findings for description). There is mild left ventricular hypertrophy. Left ventricular diastolic parameters are consistent with Grade I diastolic dysfunction (impaired relaxation).  2. Right ventricular systolic function is normal. The right ventricular size is normal. There is normal pulmonary artery systolic pressure.  3. Left atrial size was moderately dilated.  4. Right atrial size was moderately dilated.  5. There appear to be a couple jets of MR making quantificaiton of severity difficult     The predeminant jets are posteriorly and central. MR is at least moderately severe to severe . COnsider TEE to further define valve function. .Moderate to severe mitral valve regurgitation.  6. Tricuspid valve regurgitation is moderate.  7. The aortic valve is normal in structure. Aortic valve regurgitation is mild.  8. The inferior vena cava is dilated in size with <50% respiratory variability, suggesting right atrial pressure of 15 mmHg. FINDINGS  Left Ventricle: There is hypokinesis of the inferior, inferolateral walls. Left ventricular ejection fraction, by estimation, is 40 to 45%. The left ventricle has mildly decreased function. The left ventricle demonstrates regional wall motion abnormalities. The left ventricular internal cavity size was normal in size. There is mild left ventricular hypertrophy. Left ventricular diastolic parameters are consistent with Grade I diastolic dysfunction (impaired relaxation). Right Ventricle: The right ventricular size is normal. Right vetricular wall thickness was not assessed. Right ventricular systolic function is normal. There is normal pulmonary artery  systolic pressure. The tricuspid regurgitant velocity is 2.11 m/s, and with an assumed right atrial pressure of 15 mmHg, the estimated right ventricular systolic pressure is 38.2 mmHg. Left Atrium: Left atrial size was moderately dilated. Right Atrium: Right atrial size was moderately dilated. Pericardium: Trivial pericardial effusion is present. Mitral Valve: There appear to be a couple jets of MR making quantificaiton of severity difficult The predeminant jets are posteriorly and central. MR is at least moderately severe to severe . COnsider TEE to further define valve function. The mitral valve is abnormal. There is mild thickening of the mitral valve leaflet(s). Moderate to severe mitral  valve regurgitation. Tricuspid Valve: The tricuspid valve is normal in structure. Tricuspid valve regurgitation is moderate. Aortic Valve: The aortic valve is normal in structure. Aortic valve regurgitation is mild. Aortic regurgitation PHT measures 770 msec. Pulmonic Valve: The pulmonic valve was normal in structure. Pulmonic valve regurgitation is not visualized. Aorta: The aortic root and ascending aorta are structurally normal, with no evidence of dilitation. Venous: The inferior vena cava is dilated in size with less than 50% respiratory variability, suggesting right atrial pressure of 15 mmHg. IAS/Shunts: No atrial level shunt detected by color flow Doppler.  LEFT VENTRICLE PLAX 2D LVIDd:         5.34 cm  Diastology LVIDs:         4.19 cm  LV e' medial:    5.22 cm/s LV  PW:         1.36 cm  LV E/e' medial:  11.5 LV IVS:        1.18 cm  LV e' lateral:   5.33 cm/s LVOT diam:     2.10 cm  LV E/e' lateral: 11.3 LV SV:         44 LV SV Index:   24 LVOT Area:     3.46 cm  RIGHT VENTRICLE             IVC RV Basal diam:  3.64 cm     IVC diam: 2.50 cm RV Mid diam:    2.05 cm RV S prime:     10.10 cm/s TAPSE (M-mode): 1.9 cm LEFT ATRIUM             Index       RIGHT ATRIUM           Index LA diam:        5.50 cm 3.03 cm/m  RA  Area:     25.30 cm LA Vol (A2C):   77.8 ml 42.86 ml/m RA Volume:   93.40 ml  51.45 ml/m LA Vol (A4C):   67.5 ml 37.18 ml/m LA Biplane Vol: 76.6 ml 42.19 ml/m  AORTIC VALVE LVOT Vmax:   78.30 cm/s LVOT Vmean:  53.800 cm/s LVOT VTI:    0.126 m AI PHT:      770 msec  AORTA Ao Root diam: 3.10 cm Ao Asc diam:  3.80 cm MITRAL VALVE                 TRICUSPID VALVE MV Area (PHT): 4.31 cm      TR Peak grad:   17.8 mmHg MV Decel Time: 176 msec      TR Vmax:        211.00 cm/s MR Peak grad:    117.1 mmHg MR Mean grad:    77.0 mmHg   SHUNTS MR Vmax:         541.00 cm/s Systemic VTI:  0.13 m MR Vmean:        413.0 cm/s  Systemic Diam: 2.10 cm MR PISA:         1.57 cm MR PISA Eff ROA: 12 mm MR PISA Radius:  0.50 cm MV E velocity: 60.10 cm/s MV A velocity: 56.30 cm/s MV E/A ratio:  1.07 [name] MD Electronically signed by [name] MD Signature Date/Time: 11/18/2019/1:44:52 PM    Final         Scheduled Meds: . cloNIDine  0.1 mg Oral Daily  . enoxaparin (LOVENOX) injection  40 mg Subcutaneous Daily  . ferrous sulfate  325 mg Oral Q breakfast  . furosemide  40 mg Intravenous Q8H  . irbesartan  75 mg Oral Daily  . methimazole  10 mg Oral TID  . pantoprazole  40 mg Oral BID  . propranolol  60 mg Oral BID  . sodium chloride flush  3 mL Intravenous Q12H   Continuous Infusions: . sodium chloride    . cefTRIAXone (ROCEPHIN)  IV       LOS: 3 days    Time spent: 35 minutes.     Elmarie Shiley, MD [facility]   If 7PM-7AM, please contact night-coverage [email or web address]  11/19/2019, 12:21 PM

## 2019-11-19 NOTE — Progress Notes (Signed)
Received call from pt's daughter advising mom is has not received her sleep medication that was due at 10 pm and needed door opened that was closed during fire alarm. When arrived to pts room, pt is visibly upset and complained that the primary RN did not come back at exactly 10. This RN explained that the primary RN has been busy and that she can always call if she needs anything. Pt states " I shouldn't have to tell you all to do your job". Pt was also upset that bedside commode was not emptied after each use.

## 2019-11-20 DIAGNOSIS — I712 Thoracic aortic aneurysm, without rupture: Secondary | ICD-10-CM

## 2019-11-20 DIAGNOSIS — I340 Nonrheumatic mitral (valve) insufficiency: Secondary | ICD-10-CM | POA: Diagnosis not present

## 2019-11-20 DIAGNOSIS — I351 Nonrheumatic aortic (valve) insufficiency: Secondary | ICD-10-CM | POA: Diagnosis not present

## 2019-11-20 DIAGNOSIS — I5020 Unspecified systolic (congestive) heart failure: Secondary | ICD-10-CM | POA: Diagnosis not present

## 2019-11-20 LAB — BASIC METABOLIC PANEL
Anion gap: 9 (ref 5–15)
BUN: 26 mg/dL — ABNORMAL HIGH (ref 8–23)
CO2: 25 mmol/L (ref 22–32)
Calcium: 8.6 mg/dL — ABNORMAL LOW (ref 8.9–10.3)
Chloride: 104 mmol/L (ref 98–111)
Creatinine, Ser: 0.87 mg/dL (ref 0.44–1.00)
GFR, Estimated: >60 mL/min (ref >60)
Glucose, Bld: 96 mg/dL (ref 70–99)
Potassium: 4 mmol/L (ref 3.5–5.1)
Sodium: 138 mmol/L (ref 135–145)

## 2019-11-20 LAB — CBC
HCT: 30.8 % — ABNORMAL LOW (ref 36.0–46.0)
Hemoglobin: 9.6 g/dL — ABNORMAL LOW (ref 12.0–15.0)
MCH: 23.8 pg — ABNORMAL LOW (ref 26.0–34.0)
MCHC: 31.2 g/dL (ref 30.0–36.0)
MCV: 76.2 fL — ABNORMAL LOW (ref 80.0–100.0)
Platelets: 274 10*3/uL (ref 150–400)
RBC: 4.04 MIL/uL (ref 3.87–5.11)
RDW: 18.6 % — ABNORMAL HIGH (ref 11.5–15.5)
WBC: 7 10*3/uL (ref 4.0–10.5)
nRBC: 0 % (ref 0.0–0.2)

## 2019-11-20 LAB — URINE CULTURE: Culture: 60000 — AB

## 2019-11-20 LAB — MAGNESIUM: Magnesium: 1.8 mg/dL (ref 1.7–2.4)

## 2019-11-20 MED ORDER — FUROSEMIDE 40 MG PO TABS
40.0000 mg | ORAL_TABLET | Freq: Two times a day (BID) | ORAL | Status: DC
  Administered 2019-11-20 – 2019-11-21 (×2): 40 mg via ORAL
  Filled 2019-11-20 (×2): qty 1

## 2019-11-20 MED ORDER — CEPHALEXIN 500 MG PO CAPS: 500.0000 mg | ORAL_CAPSULE | Freq: Two times a day (BID) | ORAL | Status: DC

## 2019-11-20 MED ORDER — MAGNESIUM OXIDE 400 (241.3 MG) MG PO TABS
400.0000 mg | ORAL_TABLET | Freq: Every day | ORAL | Status: DC
  Administered 2019-11-20 – 2019-11-21 (×2): 400 mg via ORAL
  Filled 2019-11-20 (×2): qty 1

## 2019-11-20 NOTE — H&P (View-Only) (Signed)
Progress Note  Patient Name: Donna Mayer Date of Encounter: 11/20/2019  Primary Cardiologist: Bettina Gavia (patient reports) otherwise Mili Piltz  Subjective   64 y.o. female with a hx of Graves dz, HTN, HLD, hyperthyroid w/ thyroidectomy scheduled 11/24, IDA, depression, palpitations with presented with new cardiomyopathy and moderate aortic insufficiency, moderate to severe mitral regurgitation; planned thyroidectomy 12/07/19.  Profound diuresis overnight.  Patient is self charting her outs.  Notes that she still feels DOE when walking up and down the halls.  Inpatient Medications    Scheduled Meds:  cloNIDine  0.1 mg Oral Daily   enoxaparin (LOVENOX) injection  40 mg Subcutaneous Daily   ferrous sulfate  325 mg Oral Q breakfast   furosemide  40 mg Oral BID   irbesartan  75 mg Oral Daily   magnesium oxide  400 mg Oral Daily   methimazole  10 mg Oral TID   pantoprazole  40 mg Oral BID   propranolol  60 mg Oral BID   sodium chloride flush  3 mL Intravenous Q12H   Continuous Infusions:  sodium chloride     cefTRIAXone (ROCEPHIN)  IV 1 g (11/19/19 1321)   PRN Meds: sodium chloride, acetaminophen, ondansetron (ZOFRAN) IV, sodium chloride flush, zolpidem   Vital Signs    Vitals:   11/19/19 1959 11/19/19 2314 11/20/19 0419 11/20/19 0900  BP: 130/78 116/64 (!) 132/92 (!) 148/76  Pulse: 74 70 72 71  Resp: 18 18 18 18   Temp: 98 F (36.7 C) 98.7 F (37.1 C) 98 F (36.7 C)   TempSrc: Oral Oral Oral   SpO2: 98% 100% 98%   Weight:   77.9 kg   Height:        Intake/Output Summary (Last 24 hours) at 11/20/2019 1130 Last data filed at 11/20/2019 1127 Gross per 24 hour  Intake 600 ml  Output 7950 ml  Net -7350 ml   Filed Weights   11/18/19 0447 11/19/19 0626 11/20/19 0419  Weight: 80.3 kg 80 kg 77.9 kg    Telemetry    SR - Personally Reviewed  ECG    No new - Personally Reviewed  Physical Exam   GEN: Exopthalmos; no agitation Neck: Improved JVD Cardiac:  RRR, systolic and diastolic murmurs rubs, or gallops.  Respiratory: Clear to auscultation bilaterally. GI: Soft, nontender, non-distended  MS: +1 edema; No deformity. Neuro:  Nonfocal  Psych: Normal affect   Labs    Chemistry Recent Labs  Lab 11/16/19 1430 11/16/19 1956 11/18/19 0113 11/19/19 0713 11/20/19 0414  NA 143   < > 135 136 138  K 4.0   < > 4.8 4.1 4.0  CL 106   < > 103 103 104  CO2 23   < > 22 22 25   GLUCOSE 98   < > 114* 106* 96  BUN 14   < > 20 27* 26*  CREATININE 0.47*   < > 0.90 0.94 0.87  CALCIUM 8.3*   < > 8.6* 8.4* 8.6*  PROT 6.9  --   --   --   --   ALBUMIN 3.3*  --   --   --   --   AST 23  --   --   --   --   ALT 9  --   --   --   --   ALKPHOS 185*  --   --   --   --   BILITOT 1.8*  --   --   --   --  GFRNONAA 106   < > >60 >60 >60  GFRAA 122  --   --   --   --   ANIONGAP  --    < > 10 11 9    < > = values in this interval not displayed.     Hematology Recent Labs  Lab 11/16/19 1956 11/18/19 0837 11/20/19 0414  WBC 7.1 7.6 7.0  RBC 3.79* 3.96 4.04  HGB 8.9* 9.3* 9.6*  HCT 29.1* 29.8* 30.8*  MCV 76.8* 75.3* 76.2*  MCH 23.5* 23.5* 23.8*  MCHC 30.6 31.2 31.2  RDW 18.6* 18.5* 18.6*  PLT 249 266 274    BNP Recent Labs  Lab 11/16/19 1524 11/16/19 1956  BNP  --  2,637.8*  PROBNP 8,535*  --      DDimer No results for input(s): DDIMER in the last 168 hours.   Radiology    No results found.  Cardiac Studies   No new  Patient Profile     64 y.o. female with mitral regurgitation, Aortic insufficiency, HFmrEF, mildly reduced EF in the setting of hyperthroidism  Assessment & Plan   HFmrEF Mitral Regurgitation  In the setting of hyperthyroidism HTN Hypomagnesemia - Continue Irbesartan and propranolol (post thyroidectomy, would prefer metoprolol succindate) - will continue IV diuresis; given such profound volume loss on TID medication, agree with backing of to 40 mg IV lasix BID; would monitor K and mg - possible PO transition  11/20/19 - Reasonable for TEE 11/21/19  Mild Aortic dilation (Indexed to height 2.53) Moderate Aortic Insufficiency - diuresis as above, benefits from euvolemic TEE  Patient primary goal of care has not changed:  Getting safely through thyroidectomy give her morbidity with the disease and with difficulty optimizing medical therapy  For questions or updates, please contact [facility] Please consult [email or web address] for contact info under Cardiology/STEMI.      Signed, Werner Lean, MD  11/20/2019, 11:30 AM

## 2019-11-20 NOTE — Progress Notes (Signed)
Progress Note  Patient Name: Donna Mayer Date of Encounter: 11/20/2019  Primary Cardiologist: Bettina Gavia (patient reports) otherwise Jusiah Aguayo  Subjective   64 y.o. female with a hx of Graves dz, HTN, HLD, hyperthyroid w/ thyroidectomy scheduled 11/24, IDA, depression, palpitations with presented with new cardiomyopathy and moderate aortic insufficiency, moderate to severe mitral regurgitation; planned thyroidectomy 12/07/19.  Profound diuresis overnight.  Patient is self charting her outs.  Notes that she still feels DOE when walking up and down the halls.  Inpatient Medications    Scheduled Meds:  cloNIDine  0.1 mg Oral Daily   enoxaparin (LOVENOX) injection  40 mg Subcutaneous Daily   ferrous sulfate  325 mg Oral Q breakfast   furosemide  40 mg Oral BID   irbesartan  75 mg Oral Daily   magnesium oxide  400 mg Oral Daily   methimazole  10 mg Oral TID   pantoprazole  40 mg Oral BID   propranolol  60 mg Oral BID   sodium chloride flush  3 mL Intravenous Q12H   Continuous Infusions:  sodium chloride     cefTRIAXone (ROCEPHIN)  IV 1 g (11/19/19 1321)   PRN Meds: sodium chloride, acetaminophen, ondansetron (ZOFRAN) IV, sodium chloride flush, zolpidem   Vital Signs    Vitals:   11/19/19 1959 11/19/19 2314 11/20/19 0419 11/20/19 0900  BP: 130/78 116/64 (!) 132/92 (!) 148/76  Pulse: 74 70 72 71  Resp: 18 18 18 18   Temp: 98 F (36.7 C) 98.7 F (37.1 C) 98 F (36.7 C)   TempSrc: Oral Oral Oral   SpO2: 98% 100% 98%   Weight:   77.9 kg   Height:        Intake/Output Summary (Last 24 hours) at 11/20/2019 1130 Last data filed at 11/20/2019 1127 Gross per 24 hour  Intake 600 ml  Output 7950 ml  Net -7350 ml   Filed Weights   11/18/19 0447 11/19/19 0626 11/20/19 0419  Weight: 80.3 kg 80 kg 77.9 kg    Telemetry    SR - Personally Reviewed  ECG    No new - Personally Reviewed  Physical Exam   GEN: Exopthalmos; no agitation Neck: Improved JVD Cardiac:  RRR, systolic and diastolic murmurs rubs, or gallops.  Respiratory: Clear to auscultation bilaterally. GI: Soft, nontender, non-distended  MS: +1 edema; No deformity. Neuro:  Nonfocal  Psych: Normal affect   Labs    Chemistry Recent Labs  Lab 11/16/19 1430 11/16/19 1956 11/18/19 0113 11/19/19 0713 11/20/19 0414  NA 143   < > 135 136 138  K 4.0   < > 4.8 4.1 4.0  CL 106   < > 103 103 104  CO2 23   < > 22 22 25   GLUCOSE 98   < > 114* 106* 96  BUN 14   < > 20 27* 26*  CREATININE 0.47*   < > 0.90 0.94 0.87  CALCIUM 8.3*   < > 8.6* 8.4* 8.6*  PROT 6.9  --   --   --   --   ALBUMIN 3.3*  --   --   --   --   AST 23  --   --   --   --   ALT 9  --   --   --   --   ALKPHOS 185*  --   --   --   --   BILITOT 1.8*  --   --   --   --  GFRNONAA 106   < > >60 >60 >60  GFRAA 122  --   --   --   --   ANIONGAP  --    < > 10 11 9    < > = values in this interval not displayed.     Hematology Recent Labs  Lab 11/16/19 1956 11/18/19 0837 11/20/19 0414  WBC 7.1 7.6 7.0  RBC 3.79* 3.96 4.04  HGB 8.9* 9.3* 9.6*  HCT 29.1* 29.8* 30.8*  MCV 76.8* 75.3* 76.2*  MCH 23.5* 23.5* 23.8*  MCHC 30.6 31.2 31.2  RDW 18.6* 18.5* 18.6*  PLT 249 266 274    BNP Recent Labs  Lab 11/16/19 1524 11/16/19 1956  BNP  --  2,637.8*  PROBNP 8,535*  --      DDimer No results for input(s): DDIMER in the last 168 hours.   Radiology    No results found.  Cardiac Studies   No new  Patient Profile     64 y.o. female with mitral regurgitation, Aortic insufficiency, HFmrEF, mildly reduced EF in the setting of hyperthroidism  Assessment & Plan   HFmrEF Mitral Regurgitation  In the setting of hyperthyroidism HTN Hypomagnesemia - Continue Irbesartan and propranolol (post thyroidectomy, would prefer metoprolol succindate) - will continue IV diuresis; given such profound volume loss on TID medication, agree with backing of to 40 mg IV lasix BID; would monitor K and mg - possible PO transition  11/20/19 - Reasonable for TEE 11/21/19  Mild Aortic dilation (Indexed to height 2.53) Moderate Aortic Insufficiency - diuresis as above, benefits from euvolemic TEE  Patient primary goal of care has not changed:  Getting safely through thyroidectomy give her morbidity with the disease and with difficulty optimizing medical therapy  For questions or updates, please contact [facility] Please consult [email or web address] for contact info under Cardiology/STEMI.      Signed, Werner Lean, MD  11/20/2019, 11:30 AM

## 2019-11-20 NOTE — Progress Notes (Signed)
PROGRESS NOTE    Donna Mayer  FGH:[id number] DOB: Feb 23, 1955 DOA: 11/16/2019 PCP: [name], MD   Brief Narrative: -64 year-old with past medical history significant for Graves' disease, depression, hypertension, iron deficiency anemia who presents to the emergency department complaining of worsening shortness of breath, chest tightness, lower extremity edema and orthopnea.  All the symptoms started months ago but has been getting progressively worse over the last few days.  She stopped taking methimazole for several days but lately she has been taking it twice a day.   Evaluation in the ED patient was found to be in acute heart failure with tachypnea, oxygen saturation in the 90s, chest x-ray with findings concerning with CHF, BNP elevated 2637, patient received IV Lasix in the ED.    Assessment & Plan:   Principal Problem:   Acute CHF (congestive heart failure) (HCC) Active Problems:   Hyperthyroidism   IDA (iron deficiency anemia)   Depression   Hypertensive urgency   Elevated troponin  1-Acute Systolic, and Diastolic   CHF;  MR  Present with dyspnea, chest x ray finding CHF. Elevated BNP Component of hyperthyroidism. Echo with low Ef 45 % hypokinesis, MV regurgitation. Cardiology consulted. Plan for TEE on Monday to evaluate valve.  Received Lasix to TID 11/06. Plan to change to oral today.   Strict I's and O's and daily weight Weight 187--177--176--171 Urine out put yesterday 7 L.  TEE 11/08  2-Hyperthyroidism: Uncontrolled, patient presented with tachycardia, TSH low at 0.010, free T 4 at 2 Continue with increase dose of  methimazole 10 mg TID.  Continue with inderal 60 mg BID.  RH better controlled.   3-Hypomagnesemia; replete orally.   4-Mild elevation of troponin.  Related to CHF.  ECHO. Ef 45 %, hypokinesis. Cardiology consult.  EKG; prolong QT, replacing mg.   5-HTN; Urgency, BP 211/112 in the ED Continue with clonidine, change to daily.  Continue  with irbesartan.  On IV lasix and inderal.  Improved.   6-Iron deficiency anemia Hb stable.  Resume iron  7-UTI;  UA with 6-10 WBC.  Symptomatic.  Urine growing 60,000 gram negative rods.  Started ceftriaxone. Day 2.   Estimated body mass index is 31.42 kg/m as calculated from the following:   Height as of this encounter: 5' 2"  (1.575 m).   Weight as of this encounter: 77.9 kg.   DVT prophylaxis: Lovenox Code Status: Full code Family Communication: Discussed with patient.  Disposition Plan:  Status is: Inpatient  Remains inpatient appropriate because:IV treatments appropriate due to intensity of illness or inability to take PO   Dispo: The patient is from: Home              Anticipated d/c is to: Home              Anticipated d/c date is: 2 days              Patient currently is not medically stable to d/c.        Consultants:   Cardiology   Procedures:   ECHO Ef 45 % hypokinesis inferior , inferolateral wall.   Antimicrobials:  none  Subjective: She is breathing better, she is able to lie down flat.  She denies chest pain  Objective: Vitals:   11/19/19 1959 11/19/19 2314 11/20/19 0419 11/20/19 0900  BP: 130/78 116/64 (!) 132/92 (!) 148/76  Pulse: 74 70 72 71  Resp: 18 18 18 18   Temp: 98 F (36.7 C) 98.7 F (37.1 C)  98 F (36.7 C)   TempSrc: Oral Oral Oral   SpO2: 98% 100% 98%   Weight:   77.9 kg   Height:        Intake/Output Summary (Last 24 hours) at 11/20/2019 1347 Last data filed at 11/20/2019 1127 Gross per 24 hour  Intake 600 ml  Output 7950 ml  Net -7350 ml   Filed Weights   11/18/19 0447 11/19/19 0626 11/20/19 0419  Weight: 80.3 kg 80 kg 77.9 kg    Examination:  General exam: NAD Respiratory system: CTA Cardiovascular system: S 1, S 2 RRR systolic murmur no JVD Gastrointestinal system: BS present,soft, nt Central nervous system: Alert Extremities:  Plus 1 edema    Data Reviewed: I have personally reviewed following  labs and imaging studies  CBC: Recent Labs  Lab 11/16/19 1430 11/16/19 1956 11/18/19 0837 11/20/19 0414  WBC 6.6 7.1 7.6 7.0  NEUTROABS 4.7 5.0  --   --   HGB 8.9* 8.9* 9.3* 9.6*  HCT 28.4* 29.1* 29.8* 30.8*  MCV 78* 76.8* 75.3* 76.2*  PLT 245 249 266 951   Basic Metabolic Panel: Recent Labs  Lab 11/16/19 1956 11/17/19 0343 11/18/19 0113 11/18/19 0837 11/19/19 0713 11/20/19 0414  NA 137 138 135  --  136 138  K 3.3* 3.6 4.8  --  4.1 4.0  CL 104 104 103  --  103 104  CO2 24 23 22   --  22 25  GLUCOSE 122* 94 114*  --  106* 96  BUN 12 9 20   --  27* 26*  CREATININE 0.40* 0.40* 0.90  --  0.94 0.87  CALCIUM 8.3* 8.4* 8.6*  --  8.4* 8.6*  MG  --  1.1*  --  1.5*  --  1.8   GFR: Estimated Creatinine Clearance: 64.8 mL/min (by C-G formula based on SCr of 0.87 mg/dL). Liver Function Tests: Recent Labs  Lab 11/16/19 1430  AST 23  ALT 9  ALKPHOS 185*  BILITOT 1.8*  PROT 6.9  ALBUMIN 3.3*   No results for input(s): LIPASE, AMYLASE in the last 168 hours. No results for input(s): AMMONIA in the last 168 hours. Coagulation Profile: Recent Labs  Lab 11/16/19 1956  INR 1.4*   Cardiac Enzymes: No results for input(s): CKTOTAL, CKMB, CKMBINDEX, TROPONINI in the last 168 hours. BNP (last 3 results) Recent Labs    11/16/19 1524  PROBNP 8,535*   HbA1C: No results for input(s): HGBA1C in the last 72 hours. CBG: No results for input(s): GLUCAP in the last 168 hours. Lipid Profile: No results for input(s): CHOL, HDL, LDLCALC, TRIG, CHOLHDL, LDLDIRECT in the last 72 hours. Thyroid Function Tests: No results for input(s): TSH, T4TOTAL, FREET4, T3FREE, THYROIDAB in the last 72 hours. Anemia Panel: No results for input(s): VITAMINB12, FOLATE, FERRITIN, TIBC, IRON, RETICCTPCT in the last 72 hours. Sepsis Labs: No results for input(s): PROCALCITON, LATICACIDVEN in the last 168 hours.  Recent Results (from the past 240 hour(s))  Respiratory Panel by RT PCR (Flu A&B, Covid)  - Nasopharyngeal Swab     Status: None   Collection Time: 11/16/19 10:09 PM   Specimen: Nasopharyngeal Swab  Result Value Ref Range Status   SARS Coronavirus 2 by RT PCR NEGATIVE NEGATIVE Final    Comment: (NOTE) SARS-CoV-2 target nucleic acids are NOT DETECTED.  The SARS-CoV-2 RNA is generally detectable in upper respiratoy specimens during the acute phase of infection. The lowest concentration of SARS-CoV-2 viral copies this assay can detect is 131 copies/mL. A  negative result does not preclude SARS-Cov-2 infection and should not be used as the sole basis for treatment or other patient management decisions. A negative result may occur with  improper specimen collection/handling, submission of specimen other than nasopharyngeal swab, presence of viral mutation(s) within the areas targeted by this assay, and inadequate number of viral copies (<131 copies/mL). A negative result must be combined with clinical observations, patient history, and epidemiological information. The expected result is Negative.  Fact Sheet for Patients:  PinkCheek.be  Fact Sheet for Healthcare Providers:  GravelBags.it  This test is no t yet approved or cleared by the [place] FDA and  has been authorized for detection and/or diagnosis of SARS-CoV-2 by FDA under an Emergency Use Authorization (EUA). This EUA will remain  in effect (meaning this test can be used) for the duration of the COVID-19 declaration under Section 564(b)(1) of the Act, 21 U.S.C. section 360bbb-3(b)(1), unless the authorization is terminated or revoked sooner.     Influenza A by PCR NEGATIVE NEGATIVE Final   Influenza B by PCR NEGATIVE NEGATIVE Final    Comment: (NOTE) The Xpert Xpress SARS-CoV-2/FLU/RSV assay is intended as an aid in  the diagnosis of influenza from Nasopharyngeal swab specimens and  should not be used as a sole basis for treatment. Nasal washings and   aspirates are unacceptable for Xpert Xpress SARS-CoV-2/FLU/RSV  testing.  Fact Sheet for Patients: PinkCheek.be  Fact Sheet for Healthcare Providers: GravelBags.it  This test is not yet approved or cleared by the [place] FDA and  has been authorized for detection and/or diagnosis of SARS-CoV-2 by  FDA under an Emergency Use Authorization (EUA). This EUA will remain  in effect (meaning this test can be used) for the duration of the  Covid-19 declaration under Section 564(b)(1) of the Act, 21  U.S.C. section 360bbb-3(b)(1), unless the authorization is  terminated or revoked. Performed at [facility], [place] [address], [place],  [address]   Urine Culture     Status: Abnormal   Collection Time: 11/18/19  7:53 AM   Specimen: Urine, Random  Result Value Ref Range Status   Specimen Description URINE, RANDOM  Final   Special Requests   Final    NONE Performed at [facility], [place] [address], [place], [place] [address]    Culture 60,000 COLONIES/mL ESCHERICHIA COLI (A)  Final   Report Status 11/20/2019 FINAL  Final   Organism ID, Bacteria ESCHERICHIA COLI (A)  Final      Susceptibility   Escherichia coli - MIC*    AMPICILLIN 4 SENSITIVE Sensitive     CEFAZOLIN <=4 SENSITIVE Sensitive     CEFEPIME <=0.12 SENSITIVE Sensitive     CEFTRIAXONE <=0.25 SENSITIVE Sensitive     CIPROFLOXACIN <=0.25 SENSITIVE Sensitive     GENTAMICIN <=1 SENSITIVE Sensitive     IMIPENEM <=0.25 SENSITIVE Sensitive     NITROFURANTOIN <=16 SENSITIVE Sensitive     TRIMETH/SULFA <=20 SENSITIVE Sensitive     AMPICILLIN/SULBACTAM <=2 SENSITIVE Sensitive     PIP/TAZO <=4 SENSITIVE Sensitive     * 60,000 COLONIES/mL ESCHERICHIA COLI         Radiology Studies: No results found.      Scheduled Meds:  cloNIDine  0.1 mg Oral Daily   enoxaparin (LOVENOX) injection  40 mg Subcutaneous Daily   ferrous sulfate  325 mg Oral  Q breakfast   furosemide  40 mg Oral BID   irbesartan  75 mg Oral Daily  magnesium oxide  400 mg Oral Daily   methimazole  10 mg Oral TID   pantoprazole  40 mg Oral BID   propranolol  60 mg Oral BID   sodium chloride flush  3 mL Intravenous Q12H   Continuous Infusions:  sodium chloride     cefTRIAXone (ROCEPHIN)  IV 1 g (11/19/19 1321)     LOS: 4 days    Time spent: 35 minutes.     Elmarie Shiley, MD [facility]   If 7PM-7AM, please contact night-coverage [email or web address]  11/20/2019, 1:47 PM

## 2019-11-21 ENCOUNTER — Inpatient Hospital Stay (HOSPITAL_COMMUNITY): Payer: Medicare HMO | Admitting: Anesthesiology

## 2019-11-21 ENCOUNTER — Inpatient Hospital Stay (HOSPITAL_COMMUNITY): Payer: Medicare HMO

## 2019-11-21 ENCOUNTER — Encounter (HOSPITAL_COMMUNITY): Payer: Self-pay | Admitting: Family Medicine

## 2019-11-21 ENCOUNTER — Encounter (HOSPITAL_COMMUNITY)
Admission: EM | Disposition: A | Discharge status: Home / Self Care | Payer: Self-pay | Source: Home / Self Care | Attending: Internal Medicine

## 2019-11-21 DIAGNOSIS — I361 Nonrheumatic tricuspid (valve) insufficiency: Secondary | ICD-10-CM

## 2019-11-21 DIAGNOSIS — I340 Nonrheumatic mitral (valve) insufficiency: Secondary | ICD-10-CM

## 2019-11-21 DIAGNOSIS — I5021 Acute systolic (congestive) heart failure: Secondary | ICD-10-CM

## 2019-11-21 HISTORY — PX: TEE WITHOUT CARDIOVERSION: SHX5443

## 2019-11-21 LAB — ECHO TEE
MV M vel: 6.03 m/s
MV Peak grad: 145.4 mmHg
Radius: 0.5 cm

## 2019-11-21 LAB — BASIC METABOLIC PANEL
Anion gap: 9 (ref 5–15)
BUN: 21 mg/dL (ref 8–23)
CO2: 26 mmol/L (ref 22–32)
Calcium: 8.4 mg/dL — ABNORMAL LOW (ref 8.9–10.3)
Chloride: 103 mmol/L (ref 98–111)
Creatinine, Ser: 0.72 mg/dL (ref 0.44–1.00)
GFR, Estimated: >60 mL/min (ref >60)
Glucose, Bld: 99 mg/dL (ref 70–99)
Potassium: 3.7 mmol/L (ref 3.5–5.1)
Sodium: 138 mmol/L (ref 135–145)

## 2019-11-21 SURGERY — ECHOCARDIOGRAM, TRANSESOPHAGEAL
Anesthesia: Monitor Anesthesia Care

## 2019-11-21 MED ORDER — MAGNESIUM OXIDE 400 (241.3 MG) MG PO TABS
400.0000 mg | ORAL_TABLET | Freq: Every day | ORAL | 0 refills | Status: DC
Start: 2019-11-22 — End: 2020-03-26

## 2019-11-21 MED ORDER — SODIUM CHLORIDE 0.9 % IV SOLN: Freq: Once | INTRAVENOUS | Status: AC

## 2019-11-21 MED ORDER — BUTAMBEN-TETRACAINE-BENZOCAINE 2-2-14 % EX AERO
INHALATION_SPRAY | CUTANEOUS | Status: DC | PRN
  Administered 2019-11-21: 2 via TOPICAL

## 2019-11-21 MED ORDER — PROPOFOL 500 MG/50ML IV EMUL
INTRAVENOUS | Status: DC | PRN
  Administered 2019-11-21: 75 ug/kg/min via INTRAVENOUS

## 2019-11-21 MED ORDER — CEPHALEXIN 500 MG PO CAPS: 500.0000 mg | ORAL_CAPSULE | Freq: Two times a day (BID) | ORAL | 0 refills | Status: AC

## 2019-11-21 MED ORDER — FUROSEMIDE 40 MG PO TABS
40.0000 mg | ORAL_TABLET | Freq: Every day | ORAL | 3 refills | Status: DC
Start: 2019-11-22 — End: 2020-10-16

## 2019-11-21 MED ORDER — METHIMAZOLE 10 MG PO TABS
10.0000 mg | ORAL_TABLET | Freq: Three times a day (TID) | ORAL | 0 refills | Status: DC
Start: 2019-11-21 — End: 2019-12-19

## 2019-11-21 MED ORDER — PROPOFOL 10 MG/ML IV BOLUS
INTRAVENOUS | Status: DC | PRN
  Administered 2019-11-21 (×2): 20 mg via INTRAVENOUS

## 2019-11-21 MED ORDER — PROPRANOLOL HCL 60 MG PO TABS
60.0000 mg | ORAL_TABLET | Freq: Two times a day (BID) | ORAL | 1 refills | Status: DC
Start: 2019-11-21 — End: 2020-04-05

## 2019-11-21 MED ORDER — FUROSEMIDE 40 MG PO TABS: 40.0000 mg | ORAL_TABLET | Freq: Every day | ORAL | Status: DC

## 2019-11-21 NOTE — Progress Notes (Signed)
  Echocardiogram Echocardiogram Transesophageal has been performed.  Donna Mayer 11/21/2019, 3:03 PM

## 2019-11-21 NOTE — Anesthesia Postprocedure Evaluation (Signed)
Anesthesia Post Note  Patient: Donna Mayer  Procedure(s) Performed: TRANSESOPHAGEAL ECHOCARDIOGRAM (TEE) (N/A )     Patient location during evaluation: Endoscopy Anesthesia Type: MAC Level of consciousness: awake and alert Pain management: pain level controlled Vital Signs Assessment: post-procedure vital signs reviewed and stable Respiratory status: spontaneous breathing, nonlabored ventilation, respiratory function stable and patient connected to nasal cannula oxygen Cardiovascular status: blood pressure returned to baseline and stable Postop Assessment: no apparent nausea or vomiting Anesthetic complications: no   No complications documented.  Last Vitals:  Vitals:   11/21/19 1509 11/21/19 1533  BP: (!) 144/78 (!) 156/82  Pulse: 64 (!) 59  Resp: 16 18  Temp: 36.6 C   SpO2: 100% 100%    Last Pain:  Vitals:   11/21/19 1509  TempSrc: Oral  PainSc:                  [name] L Perlie Stene

## 2019-11-21 NOTE — Discharge Instructions (Signed)
Heart Failure, Diagnosis  Heart failure means that your heart is not able to pump blood in the right way. This makes it hard for your body to work well. Heart failure is usually a long-term (chronic) condition. You must take good care of yourself and follow your treatment plan from your doctor. What are the causes? This condition may be caused by:  High blood pressure.  Build up of cholesterol and fat in the arteries.  Heart attack. This injures the heart muscle.  Heart valves that do not open and close properly.  Damage of the heart muscle. This is also called cardiomyopathy.  Lung disease.  Abnormal heart rhythms. What increases the risk? The risk of heart failure goes up as a person ages. This condition is also more likely to develop in people who:  Are overweight.  Are female.  Smoke or chew tobacco.  Abuse alcohol or illegal drugs.  Have taken medicines that can damage the heart.  Have diabetes.  Have abnormal heart rhythms.  Have thyroid problems.  Have low blood counts (anemia). What are the signs or symptoms? Symptoms of this condition include:  Shortness of breath.  Coughing.  Swelling of the feet, ankles, legs, or belly.  Losing weight for no reason.  Trouble breathing.  Waking from sleep because of the need to sit up and get more air.  Rapid heartbeat.  Being very tired.  Feeling dizzy, or feeling like you may pass out (faint).  Having no desire to eat.  Feeling like you may vomit (nauseous).  Peeing (urinating) more at night.  Feeling confused. How is this treated?     This condition may be treated with:  Medicines. These can be given to treat blood pressure and to make the heart muscles stronger.  Changes in your daily life. These may include eating a healthy diet, staying at a healthy body weight, quitting tobacco and illegal drug use, or doing exercises.  Surgery. Surgery can be done to open blocked valves, or to put devices in  the heart, such as pacemakers.  A donor heart (heart transplant). You will receive a healthy heart from a donor. Follow these instructions at home:  Treat other conditions as told by your doctor. These may include high blood pressure, diabetes, thyroid disease, or abnormal heart rhythms.  Learn as much as you can about heart failure.  Get support as you need it.  Keep all follow-up visits as told by your doctor. This is important. Summary  Heart failure means that your heart is not able to pump blood in the right way.  This condition is caused by high blood pressure, heart attack, or damage of the heart muscle.  Symptoms of this condition include shortness of breath and swelling of the feet, ankles, legs, or belly. You may also feel very tired or feel like you may vomit.  You may be treated with medicines, surgery, or changes in your daily life.  Treat other health conditions as told by your doctor. This information is not intended to replace advice given to you by your health care provider. Make sure you discuss any questions you have with your health care provider. Document Revised: 03/19/2018 Document Reviewed: 03/19/2018 Elsevier Patient Education  [place].   Heart Failure, Self Care Heart failure is a serious condition. This sheet explains things you need to do to take care of yourself at home. To help you stay as healthy as possible, you may be asked to change your diet, take  certain medicines, and make other changes in your life. Your doctor may also give you more specific instructions. If you have problems or questions, call your doctor. What are the risks? Having heart failure makes it more likely for you to have some problems. These problems can get worse if you do not take good care of yourself. Problems may include:  Blood clotting problems. This may cause a stroke.  Damage to the kidneys, liver, or lungs.  Abnormal heart rhythms. Supplies needed:  Scale  for weighing yourself.  Blood pressure monitor.  Notebook.  Medicines. How to care for yourself when you have heart failure Medicines Take over-the-counter and prescription medicines only as told by your doctor. Take your medicines every day.  Do not stop taking your medicine unless your doctor tells you to do so.  Do not skip any medicines.  Get your prescriptions refilled before you run out of medicine. This is important. Eating and drinking   Eat heart-healthy foods. Talk with a diet specialist (dietitian) to create an eating plan.  Choose foods that: ? Have no trans fat. ? Are low in saturated fat and cholesterol.  Choose healthy foods, such as: ? Fresh or frozen fruits and vegetables. ? Fish. ? Low-fat (lean) meats. ? Legumes, such as beans, peas, and lentils. ? Fat-free or low-fat dairy products. ? Whole-grain foods. ? High-fiber foods.  Limit salt (sodium) if told by your doctor. Ask your diet specialist to tell you which seasonings are healthy for your heart.  Cook in healthy ways instead of frying. Healthy ways of cooking include roasting, grilling, broiling, baking, poaching, steaming, and stir-frying.  Limit how much fluid you drink, if told by your doctor. Alcohol use  Do not drink alcohol if: ? Your doctor tells you not to drink. ? Your heart was damaged by alcohol, or you have very bad heart failure. ? You are pregnant, may be pregnant, or are planning to become pregnant.  If you drink alcohol: ? Limit how much you use to:  0-1 drink a day for women.  0-2 drinks a day for men. ? Be aware of how much alcohol is in your drink. In the [place] one drink equals one 12 oz bottle of beer (355 mL), one 5 oz glass of wine (148 mL), or one 1 oz glass of hard liquor (44 mL). Lifestyle   Do not use any products that contain nicotine or tobacco, such as cigarettes, e-cigarettes, and chewing tobacco. If you need help quitting, ask your doctor. ? Do not use  nicotine gum or patches before talking to your doctor.  Do not use illegal drugs.  Lose weight if told by your doctor.  Do physical activity if told by your doctor. Talk to your doctor before you begin an exercise if: ? You are an older adult. ? You have very bad heart failure.  Learn to manage stress. If you need help, ask your doctor.  Get rehab (rehabilitation) to help you stay independent and to help with your quality of life.  Plan time to rest when you get tired. Check weight and blood pressure   Weigh yourself every day. This will help you to know if fluid is building up in your body. ? Weigh yourself every morning after you pee (urinate) and before you eat breakfast. ? Wear the same amount of clothing each time. ? Write down your daily weight. Give your record to your doctor.  Check and write down your blood pressure as told by  your doctor.  Check your pulse as told by your doctor. Dealing with very hot and very cold weather  If it is very hot: ? Avoid activities that take a lot of energy. ? Use air conditioning or fans, or find a cooler place. ? Avoid caffeine and alcohol. ? Wear clothing that is loose-fitting, lightweight, and light-colored.  If it is very cold: ? Avoid activities that take a lot of energy. ? Layer your clothes. ? Wear mittens or gloves, a hat, and a scarf when you go outside. ? Avoid alcohol. Follow these instructions at home:  Stay up to date with shots (vaccines). Get pneumococcal and flu (influenza) shots.  Keep all follow-up visits as told by your doctor. This is important. Contact a doctor if:  You gain weight quickly.  You have increasing shortness of breath.  You cannot do your normal activities.  You get tired easily.  You cough a lot.  You don't feel like eating or feel like you may vomit (nauseous).  You become puffy (swell) in your hands, feet, ankles, or belly (abdomen).  You cannot sleep well because it is hard to  breathe.  You feel like your heart is beating fast (palpitations).  You get dizzy when you stand up. Get help right away if:  You have trouble breathing.  You or someone else notices a change in your behavior, such as having trouble staying awake.  You have chest pain or discomfort.  You pass out (faint). These symptoms may be an emergency. Do not wait to see if the symptoms will go away. Get medical help right away. Call your local emergency services (911 in the [place] Do not drive yourself to the hospital. Summary  Heart failure is a serious condition. To care for yourself, you may have to change your diet, take medicines, and make other lifestyle changes.  Take your medicines every day. Do not stop taking them unless your doctor tells you to do so.  Eat heart-healthy foods, such as fresh or frozen fruits and vegetables, fish, lean meats, legumes, fat-free or low-fat dairy products, and whole-grain or high-fiber foods.  Ask your doctor if you can drink alcohol. You may have to stop alcohol use if you have very bad heart failure.  Contact your doctor if you gain weight quickly or feel that your heart is beating too fast. Get help right away if you pass out, or have chest pain or trouble breathing. This information is not intended to replace advice given to you by your health care provider. Make sure you discuss any questions you have with your health care provider. Document Revised: 04/13/2018 Document Reviewed: 04/14/2018 Elsevier Patient Education  [place].

## 2019-11-21 NOTE — Anesthesia Preprocedure Evaluation (Addendum)
Anesthesia Evaluation  Patient identified by MRN, date of birth, ID band Patient awake    Reviewed: Allergy & Precautions, NPO status , Patient's Chart, lab work & pertinent test results, reviewed documented beta blocker date and time   Airway Mallampati: II  TM Distance: >3 FB Neck ROM: Full    Dental no notable dental hx. (+) Teeth Intact, Dental Advisory Given   Pulmonary neg pulmonary ROS,    Pulmonary exam normal breath sounds clear to auscultation       Cardiovascular hypertension, Pt. on home beta blockers and Pt. on medications +CHF  Normal cardiovascular exam+ Valvular Problems/Murmurs MR  Rhythm:Regular Rate:Normal  TTE 11/2019 1. There is hypokinesis of the inferior, inferolateral walls . Left ventricular ejection fraction, by estimation, is 40 to 45%. The left ventricle has mildly decreased function. The left ventricle demonstrates regional wall motion abnormalities (see scoring diagram/findings for description). There is mild left ventricular  hypertrophy. Left ventricular diastolic parameters are consistent with Grade I diastolic dysfunction (impaired relaxation).  2. Right ventricular systolic function is normal. The right ventricular size is normal. There is normal pulmonary artery systolic pressure.  3. Left atrial size was moderately dilated.  4. Right atrial size was moderately dilated.  5. There appear to be a couple jets of MR making quantificaiton of severity difficult.The predeminant jets are posteriorly and central. MR is at least moderately severe to severe . COnsider TEE to further define valve  function. .Moderate to severe mitral valve regurgitation.  6. Tricuspid valve regurgitation is moderate.  7. The aortic valve is normal in structure. Aortic valve regurgitation is mild.  8. The inferior vena cava is dilated in size with <50% respiratory variability, suggesting right atrial pressure of 15 mmHg    Neuro/Psych PSYCHIATRIC DISORDERS Anxiety Depression negative neurological ROS     GI/Hepatic Neg liver ROS, GERD  ,  Endo/Other  Hypothyroidism Hyperthyroidism   Renal/GU negative Renal ROS  negative genitourinary   Musculoskeletal negative musculoskeletal ROS (+)   Abdominal   Peds  Hematology  (+) Blood dyscrasia (Hgb 9.6), anemia ,   Anesthesia Other Findings   Reproductive/Obstetrics                            Anesthesia Physical Anesthesia Plan  ASA: III  Anesthesia Plan: MAC   Post-op Pain Management:    Induction: Intravenous  PONV Risk Score and Plan: Propofol infusion and Treatment may vary due to age or medical condition  Airway Management Planned: Natural Airway  Additional Equipment:   Intra-op Plan:   Post-operative Plan:   Informed Consent: I have reviewed the patients History and Physical, chart, labs and discussed the procedure including the risks, benefits and alternatives for the proposed anesthesia with the patient or authorized representative who has indicated his/her understanding and acceptance.     Dental advisory given  Plan Discussed with: CRNA  Anesthesia Plan Comments:         Anesthesia Quick Evaluation

## 2019-11-21 NOTE — Care Management Important Message (Signed)
Important Message  Patient Details  Name: Donna Mayer MRN: [id number] Date of Birth: 13-Oct-1955   Medicare Important Message Given:  Yes     Shelda Altes 11/21/2019, 10:02 AM

## 2019-11-21 NOTE — Transfer of Care (Signed)
Immediate Anesthesia Transfer of Care Note  Patient: Donna Mayer  Procedure(s) Performed: TRANSESOPHAGEAL ECHOCARDIOGRAM (TEE) (N/A )  Patient Location: Endoscopy Unit  Anesthesia Type:MAC  Level of Consciousness: drowsy and patient cooperative  Airway & Oxygen Therapy: Patient Spontanous Breathing  Post-op Assessment: Report given to RN, Post -op Vital signs reviewed and stable and Patient moving all extremities X 4  Post vital signs: Reviewed and stable  Last Vitals:  Vitals Value Taken Time  BP 150/72 11/21/19 1437  Temp    Pulse 65 11/21/19 1437  Resp 27 11/21/19 1437  SpO2 96 % 11/21/19 1437  Vitals shown include unvalidated device data.  Last Pain:  Vitals:   11/21/19 1243  TempSrc: Oral  PainSc: 0-No pain      Patients Stated Pain Goal: 0 (61/53/79 4327)  Complications: No complications documented.

## 2019-11-21 NOTE — Progress Notes (Signed)
Progress Note  Patient Name: Donna Mayer Date of Encounter: 11/21/2019  [place] Cardiologist: No primary care provider on file.   Subjective   No acute overnight events. Breathing greatly improved. Almost back to baseline. No chest pain. Lower extremity edema resolved.  Inpatient Medications    Scheduled Meds: . cephALEXin  500 mg Oral Q12H  . cloNIDine  0.1 mg Oral Daily  . enoxaparin (LOVENOX) injection  40 mg Subcutaneous Daily  . ferrous sulfate  325 mg Oral Q breakfast  . furosemide  40 mg Oral BID  . irbesartan  75 mg Oral Daily  . magnesium oxide  400 mg Oral Daily  . methimazole  10 mg Oral TID  . pantoprazole  40 mg Oral BID  . propranolol  60 mg Oral BID  . sodium chloride flush  3 mL Intravenous Q12H   Continuous Infusions: . sodium chloride     PRN Meds: sodium chloride, acetaminophen, ondansetron (ZOFRAN) IV, sodium chloride flush, zolpidem   Vital Signs    Vitals:   11/20/19 0900 11/20/19 1707 11/20/19 1954 11/21/19 0308  BP: (!) 148/76 127/77 128/66 (!) 154/73  Pulse: 71 69 72 72  Resp: 18 18 18 18   Temp:  98.4 F (36.9 C) 98 F (36.7 C) 98.7 F (37.1 C)  TempSrc:  Oral Oral Oral  SpO2:  100% 98% 100%  Weight:    77.7 kg  Height:        Intake/Output Summary (Last 24 hours) at 11/21/2019 0757 Last data filed at 11/21/2019 0714 Gross per 24 hour  Intake 240 ml  Output 3150 ml  Net -2910 ml   Last 3 Weights 11/21/2019 11/20/2019 11/19/2019  Weight (lbs) 171 lb 4.8 oz 171 lb 12.8 oz 176 lb 4.8 oz  Weight (kg) 77.7 kg 77.928 kg 79.969 kg      Telemetry    Sinus rhythm with rates in the 60's to 70-'s. - Personally Reviewed  ECG    No new ECG tracings today. - Personally Reviewed  Physical Exam   GEN: No acute distress.   Neck: JVD elevated. Cardiac: RRR. Soft murmur noted. No gallops or rubs. Respiratory: Clear to auscultation bilaterally. No wheezes, rhonchi, or rales. GI: Soft, non-tender, non-distended  MS: Trace lower  extremity edema. No deformity. Skin: Warm and dry. Neuro:  No focal deficits. Psych: Normal affect. Responds appropriately.   Labs    High Sensitivity Troponin:   Recent Labs  Lab 11/16/19 1956 11/16/19 2155  TROPONINIHS 70* 74*      Chemistry Recent Labs  Lab 11/16/19 1430 11/16/19 1956 11/19/19 0713 11/20/19 0414 11/21/19 0304  NA 143   < > 136 138 138  K 4.0   < > 4.1 4.0 3.7  CL 106   < > 103 104 103  CO2 23   < > 22 25 26   GLUCOSE 98   < > 106* 96 99  BUN 14   < > 27* 26* 21  CREATININE 0.47*   < > 0.94 0.87 0.72  CALCIUM 8.3*   < > 8.4* 8.6* 8.4*  PROT 6.9  --   --   --   --   ALBUMIN 3.3*  --   --   --   --   AST 23  --   --   --   --   ALT 9  --   --   --   --   ALKPHOS 185*  --   --   --   --  BILITOT 1.8*  --   --   --   --   GFRNONAA 106   < > >60 >60 >60  GFRAA 122  --   --   --   --   ANIONGAP  --    < > 11 9 9    < > = values in this interval not displayed.     Hematology Recent Labs  Lab 11/16/19 1956 11/18/19 0837 11/20/19 0414  WBC 7.1 7.6 7.0  RBC 3.79* 3.96 4.04  HGB 8.9* 9.3* 9.6*  HCT 29.1* 29.8* 30.8*  MCV 76.8* 75.3* 76.2*  MCH 23.5* 23.5* 23.8*  MCHC 30.6 31.2 31.2  RDW 18.6* 18.5* 18.6*  PLT 249 266 274    BNP Recent Labs  Lab 11/16/19 1524 11/16/19 1956  BNP  --  2,637.8*  PROBNP 8,535*  --      DDimer No results for input(s): DDIMER in the last 168 hours.   Radiology    No results found.  Cardiac Studies   Echocardiogram 11/18/2019: Impressions: 1. There is hypokinesis of the inferior, inferolateral walls . Left  ventricular ejection fraction, by estimation, is 40 to 45%. The left  ventricle has mildly decreased function. The left ventricle demonstrates  regional wall motion abnormalities (see  scoring diagram/findings for description). There is mild left ventricular  hypertrophy. Left ventricular diastolic parameters are consistent with  Grade I diastolic dysfunction (impaired relaxation).  2. Right  ventricular systolic function is normal. The right ventricular  size is normal. There is normal pulmonary artery systolic pressure.  3. Left atrial size was moderately dilated.  4. Right atrial size was moderately dilated.  5. There appear to be a couple jets of MR making quantificaiton of  severity difficult   The predeminant jets are posteriorly and central. MR is at least  moderately severe to severe . COnsider TEE to further define valve  function. .Moderate to severe mitral valve regurgitation.  6. Tricuspid valve regurgitation is moderate.  7. The aortic valve is normal in structure. Aortic valve regurgitation is  mild.  8. The inferior vena cava is dilated in size with <50% respiratory  variability, suggesting right atrial pressure of 15 mmHg.  Patient Profile     64 y.o. female with a history of hyperthyroidism/Graves disease scheduled for thyroidectomy on 12/07/2019, palpitations, hypertension, hyperlipidemia, depression who is being seen for evaluation of CHF after presenting with shortness of breath, chest tightness, and lower extremity edema.  Assessment & Plan    Acute on Chronic Combined CHF - In the setting of hyperthyroidism. -  BNP elevated at 2,637.8. - Chest x-ray consistent with mild CHF. - Echo showed LVEF of 40-45% with hypokinesis of inferior and inferolateral walls, grade 1 diastolic dysfunction, moderate to severe MR, and moderate TR. - IV Lasix decreased to twice daily yesterday given brisk urine output. Documented 2.9 L of urinary output yesterday and net negative 11.3 L since admission. Weight down 16 lbs since admission. Renal functions stable. - Continue IV Lasix 51m twice daily. - Continue Propranolol 630mtwice daily. Consider switching to Toprol-XL after thyroidectomy. - Continue Irbesartan 7573maily.  - Continue daily weights, strict I/O's, and renal function.   Mitral Regurgitation - Noted to be moderate to severe on TTE this  admission. - Plan is for TEE today.  After careful review of history and examination, the risks and benefits of transesophageal echocardiogram have been explained including risks of esophageal damage, perforation (1:10,000 risk), bleeding, pharyngeal hematoma as well as other  potential complications associated with conscious sedation including aspiration, arrhythmia, respiratory failure and death. Alternatives to treatment were discussed, questions were answered. Patient is willing to proceed.   Chest Pain  Elevated Troponin - Patient reported some severe chest tightness but this mostly occurred when she was very short of breath. - EKG showed non-specific ST/T changes. - Echo showed LVEF of 40-45% with hypokinesis of inferior and inferolateral walls. - No chest pain this morning.  - Will discuss ischemic evaluation with MD given upcoming surgery. Patient does not want thyroidectomy to be delayed if at all possible.  Hypertension - BP mildly elevated at this morning but this was before morning medications.  - Continue Irbesartan 73m daily, Propranolol 665mtwice daily, Clonidine 0.43m50maily. - Can increase Irbesartan if needed.  Hyperthyroidism/ Graves Disease - TSH <0.010. - Free T4 2.00. Free T3 normal. - On Methimazole. - Already scheduled for thyroidectomy on 11/24. - Management per primary team.  -  For questions or updates, please contact [facility] consult [email or web address] for contact info under        Signed, CalDarreld McleanA-C  11/21/2019, 7:57 AM

## 2019-11-21 NOTE — Discharge Summary (Signed)
Physician Discharge Summary  SARIE STALL OEV:[id number] DOB: Mar 17, 1955 DOA: 11/16/2019  PCP: [name], MD  Admit date: 11/16/2019 Discharge date: 11/22/2019  Admitted From: Home  Disposition: Home   Recommendations for Outpatient Follow-up:  1. Follow up with PCP in 1-2 weeks 2. Please obtain BMP/CBC in one week 3. Needs to follow up with cardiology for further care of mitral regurgitation.  4. Follow up with PCP for further arrangement of thyroidectomy.     Discharge Condition: Stable.  CODE STATUS: full code Diet recommendation: Heart Healthy  Brief/Interim Summary: 64 year-old with past medical history significant for Graves' disease, depression, hypertension, iron deficiency anemia who presents to the emergency department complaining of worsening shortness of breath, chest tightness, lower extremity edema and orthopnea.  All the symptoms started months ago but has been getting progressively worse over the last few days.  She stopped taking methimazole for several days but lately she has been taking it twice a day.   Evaluation in the ED patient was found to be in acute heart failure with tachypnea, oxygen saturation in the 90s, chest x-ray with findings concerning with CHF, BNP elevated 2637, patient received IV Lasix in the ED.  Patient treated for CHF exacerbation, UTI<. See detail below.   1-Acute Systolic, and Diastolic   CHF;  MR  Present with dyspnea, chest x ray finding CHF. Elevated BNP Component of hyperthyroidism. Echo with low Ef 45 % hypokinesis, MV regurgitation. Cardiology consulted. Underwent TEE; showed MR moderate to moderate severity. Needs to follow up with cardiologist for further care MR>  Received Lasix to TID 11/06. She was transition to oral lasix. 40 mg daily.  Strict I's and O's and daily weight Weight 187--177--176--171 Urine out put yesterday 7 L.  Stable for discharge.   2-Hyperthyroidism: Uncontrolled, patient presented with  tachycardia, TSH low at 0.010, free T 4 at 2 Continue with increase dose of  methimazole 10 mg TID.  Continue with inderal 60 mg BID.  RH better controlled.   3-Hypomagnesemia; replete orally.   4-Mild elevation of troponin.  Related to CHF.  ECHO. Ef 45 %, hypokinesis. Cardiology consult.  EKG; prolong QT, replacing mg.   5-HTN; Urgency, BP 211/112 in the ED Continue with clonidine, resume BID home dose. BP increasing.   Continue with irbesartan.  Received  IV lasix and inderal.  Improved.   6-Iron deficiency anemia Hb stable.  Resume iron  7-UTI;  UA with 6-10 WBC.  Symptomatic.  Urine growing 60,000 gram negative rods.  Started ceftriaxone. Day 3. Plan to discharge on 2 days of keflex. Marland Kitchen   Discharge Diagnoses:  Principal Problem:   Acute CHF (congestive heart failure) (HCC) Active Problems:   Hyperthyroidism   IDA (iron deficiency anemia)   Depression   Hypertensive urgency   Elevated troponin    Discharge Instructions  Discharge Instructions    Diet - low sodium heart healthy   Complete by: As directed    Increase activity slowly   Complete by: As directed      Allergies as of 11/21/2019   No Known Allergies     Medication List    STOP taking these medications   atenolol 50 MG tablet Commonly known as: TENORMIN   diltiazem 120 MG 24 hr capsule Commonly known as: Cardizem CD     TAKE these medications   cephALEXin 500 MG capsule Commonly known as: KEFLEX Take 1 capsule (500 mg total) by mouth 2 (two) times daily for 2 days.  cloNIDine 0.1 MG tablet Commonly known as: CATAPRES TAKE 1 TABLET TWICE DAILY   ferrous sulfate 325 (65 FE) MG tablet Take 325 mg by mouth daily with breakfast.   furosemide 40 MG tablet Commonly known as: LASIX Take 1 tablet (40 mg total) by mouth daily. What changed:   medication strength  how much to take   magnesium oxide 400 (241.3 Mg) MG tablet Commonly known as: MAG-OX Take 1 tablet (400 mg  total) by mouth daily.   methimazole 10 MG tablet Commonly known as: TAPAZOLE Take 1 tablet (10 mg total) by mouth 3 (three) times daily. What changed: See the new instructions.   NexIUM 40 MG capsule Generic drug: esomeprazole Take twice a day by mouth. What changed:   how much to take  how to take this  when to take this   propranolol 60 MG tablet Commonly known as: INDERAL Take 1 tablet (60 mg total) by mouth 2 (two) times daily.   Trintellix 10 MG Tabs tablet Generic drug: vortioxetine HBr Take 10 mg by mouth daily.   valsartan 80 MG tablet Commonly known as: Diovan Take 1 tablet (80 mg total) by mouth daily.   zolpidem 12.5 MG CR tablet Commonly known as: AMBIEN CR TAKE 1 TABLET(12.5 MG) BY MOUTH AT BEDTIME AS NEEDED FOR SLEEP What changed: See the new instructions.       Follow-up Information    O'Neal, Cassie Freer, MD Follow up.   Specialties: Internal Medicine, Cardiology, Radiology Why: Hospital follow-up scheduled for 11/29/2019 at 8:00am in our [facility] office. Please arrive 15 minutes early for check-in. If this date/time does not work for you, please call our office to reschedule. Contact information: [place] [address] [phone]        [name], MD Follow up in 1 week(s).   Specialties: Family Medicine, Interventional Cardiology, Radiology, Anesthesiology Why: needs lab work to check kidney function in 4 days  Contact information: [place] [address] [phone]              No Known Allergies  Consultations:  cardiology    Procedures/Studies: DG Chest 2 View  Result Date: 11/16/2019 CLINICAL DATA:  Shortness of breath and chest tightness EXAM: CHEST - 2 VIEW COMPARISON:  05/24/2019 FINDINGS: Cardiac shadow is enlarged but accentuated by the frontal technique. Vascular congestion is noted with mild interstitial edema. Right basilar atelectasis is noted as well. No sizable  effusion is seen. Gastric lap band is noted. No bony abnormality is seen. IMPRESSION: Changes of mild CHF. Electronically Signed   By: Inez Catalina M.D.   On: 11/16/2019 20:22   ECHOCARDIOGRAM COMPLETE  Result Date: 11/18/2019    ECHOCARDIOGRAM REPORT   Patient Name:   KENNY STERN Date of Exam: 11/18/2019 Medical Rec #:  [id number]      Height:       62.0 in Accession #:    [id number]     Weight:       177.1 lb Date of Birth:  [birth year]      BSA:          1.815 m Patient Age:    64 years       BP:           135/77 mmHg Patient Gender: F              HR:           75 bpm. Exam Location:  Inpatient Procedure: 2D Echo, Cardiac Doppler and Color Doppler Indications:    I50.33 Acute on chronic diastolic (congestive) heart failure;                 Elevated Troponin.  History:        Patient has no prior history of Echocardiogram examinations.                 Signs/Symptoms:Murmur; Risk Factors:Dyslipidemia and                 Hypertension. Graves Disease. GERD.  Sonographer:    Jonelle Sidle Dance Referring Phys: [id number] [place]  1. There is hypokinesis of the inferior, inferolateral walls . Left ventricular ejection fraction, by estimation, is 40 to 45%. The left ventricle has mildly decreased function. The left ventricle demonstrates regional wall motion abnormalities (see scoring diagram/findings for description). There is mild left ventricular hypertrophy. Left ventricular diastolic parameters are consistent with Grade I diastolic dysfunction (impaired relaxation).  2. Right ventricular systolic function is normal. The right ventricular size is normal. There is normal pulmonary artery systolic pressure.  3. Left atrial size was moderately dilated.  4. Right atrial size was moderately dilated.  5. There appear to be a couple jets of MR making quantificaiton of severity difficult     The predeminant jets are posteriorly and central. MR is at least moderately severe to severe . COnsider TEE to further  define valve function. .Moderate to severe mitral valve regurgitation.  6. Tricuspid valve regurgitation is moderate.  7. The aortic valve is normal in structure. Aortic valve regurgitation is mild.  8. The inferior vena cava is dilated in size with <50% respiratory variability, suggesting right atrial pressure of 15 mmHg. FINDINGS  Left Ventricle: There is hypokinesis of the inferior, inferolateral walls. Left ventricular ejection fraction, by estimation, is 40 to 45%. The left ventricle has mildly decreased function. The left ventricle demonstrates regional wall motion abnormalities. The left ventricular internal cavity size was normal in size. There is mild left ventricular hypertrophy. Left ventricular diastolic parameters are consistent with Grade I diastolic dysfunction (impaired relaxation). Right Ventricle: The right ventricular size is normal. Right vetricular wall thickness was not assessed. Right ventricular systolic function is normal. There is normal pulmonary artery systolic pressure. The tricuspid regurgitant velocity is 2.11 m/s, and with an assumed right atrial pressure of 15 mmHg, the estimated right ventricular systolic pressure is 40.0 mmHg. Left Atrium: Left atrial size was moderately dilated. Right Atrium: Right atrial size was moderately dilated. Pericardium: Trivial pericardial effusion is present. Mitral Valve: There appear to be a couple jets of MR making quantificaiton of severity difficult The predeminant jets are posteriorly and central. MR is at least moderately severe to severe . COnsider TEE to further define valve function. The mitral valve is abnormal. There is mild thickening of the mitral valve leaflet(s). Moderate to severe mitral  valve regurgitation. Tricuspid Valve: The tricuspid valve is normal in structure. Tricuspid valve regurgitation is moderate. Aortic Valve: The aortic valve is normal in structure. Aortic valve regurgitation is mild. Aortic regurgitation PHT measures  770 msec. Pulmonic Valve: The pulmonic valve was normal in structure. Pulmonic valve regurgitation is not visualized. Aorta: The aortic root and ascending aorta are structurally normal, with no evidence of dilitation. Venous: The inferior vena cava is dilated in size with less than 50% respiratory variability, suggesting right atrial pressure of 15 mmHg. IAS/Shunts: No atrial level shunt detected by color flow Doppler.  LEFT  VENTRICLE PLAX 2D LVIDd:         5.34 cm  Diastology LVIDs:         4.19 cm  LV e' medial:    5.22 cm/s LV PW:         1.36 cm  LV E/e' medial:  11.5 LV IVS:        1.18 cm  LV e' lateral:   5.33 cm/s LVOT diam:     2.10 cm  LV E/e' lateral: 11.3 LV SV:         44 LV SV Index:   24 LVOT Area:     3.46 cm  RIGHT VENTRICLE             IVC RV Basal diam:  3.64 cm     IVC diam: 2.50 cm RV Mid diam:    2.05 cm RV S prime:     10.10 cm/s TAPSE (M-mode): 1.9 cm LEFT ATRIUM             Index       RIGHT ATRIUM           Index LA diam:        5.50 cm 3.03 cm/m  RA Area:     25.30 cm LA Vol (A2C):   77.8 ml 42.86 ml/m RA Volume:   93.40 ml  51.45 ml/m LA Vol (A4C):   67.5 ml 37.18 ml/m LA Biplane Vol: 76.6 ml 42.19 ml/m  AORTIC VALVE LVOT Vmax:   78.30 cm/s LVOT Vmean:  53.800 cm/s LVOT VTI:    0.126 m AI PHT:      770 msec  AORTA Ao Root diam: 3.10 cm Ao Asc diam:  3.80 cm MITRAL VALVE                 TRICUSPID VALVE MV Area (PHT): 4.31 cm      TR Peak grad:   17.8 mmHg MV Decel Time: 176 msec      TR Vmax:        211.00 cm/s MR Peak grad:    117.1 mmHg MR Mean grad:    77.0 mmHg   SHUNTS MR Vmax:         541.00 cm/s Systemic VTI:  0.13 m MR Vmean:        413.0 cm/s  Systemic Diam: 2.10 cm MR PISA:         1.57 cm MR PISA Eff ROA: 12 mm MR PISA Radius:  0.50 cm MV E velocity: 60.10 cm/s MV A velocity: 56.30 cm/s MV E/A ratio:  1.07 [name] MD Electronically signed by [name] MD Signature Date/Time: 11/18/2019/1:44:52 PM    Final    ECHO TEE  Result Date: 11/21/2019    TRANSESOPHOGEAL  ECHO REPORT   Patient Name:   KAARIN PARDY Date of Exam: 11/21/2019 Medical Rec #:  [id number]      Height:       62.0 in Accession #:    [id number]     Weight:       171.3 lb Date of Birth:  Sep 03, 1955      BSA:          1.790 m Patient Age:    81 years       BP:           160/80 mmHg Patient Gender: F              HR:  89 bpm. Exam Location:  Inpatient Procedure: Transesophageal Echo, 3D Echo, Limited Color Doppler and Cardiac            Doppler Indications:     Mitral Regurgitation  History:         Patient has prior history of Echocardiogram examinations, most                  recent 11/18/2019. CHF, Signs/Symptoms:Murmur; Risk                  Factors:Hypertension, Dyslipidemia and Non-Smoker. GERD.  Sonographer:     Vickie Epley RDCS Referring Phys:  [id number] [facility] [employer] [employer] Diagnosing Phys: [name] MD PROCEDURE: The transesophogeal probe was passed without difficulty through the esophogus of the patient. Local oropharyngeal anesthetic was provided with Cetacaine. Sedation performed by different physician. The patient was monitored while under deep sedation. Anesthestetic sedation was provided intravenously by Anesthesiology: 271.55m of Propofol. The patient developed no complications during the procedure. IMPRESSIONS  1. Significant hypertrophy of the papillary muscles. . The left ventricle has mild to moderately decreased function.  2. Right ventricular systolic function is moderately reduced. The right ventricular size is mildly enlarged.  3. Left atrial size was mildly dilated. No left atrial/left atrial appendage thrombus was detected.  4. Right atrial size was moderately dilated.  5. Mitral valve annulus measures 37 mm The MV appears normal. There are multiple jets of MR making quantification diffiicult. The largest jet is directed more posteriorly into LA. MR is moderate to moderately severe in severity. (3-4/4) Carpentier Type IIIb  6. Tricuspid valve regurgitation is moderate to  severe.  7. The aortic valve is tricuspid. Aortic valve regurgitation is mild. Mild aortic valve sclerosis is present, with no evidence of aortic valve stenosis.  8. Minimal fixed plaquing in the thoracic aorta.  9. Main PA appears dilated at 38 mm. FINDINGS  Left Ventricle: Significant hypertrophy of the papillary muscles. The left ventricle has mild to moderately decreased function. The left ventricular internal cavity size was normal in size. Right Ventricle: The right ventricular size is mildly enlarged. Right vetricular wall thickness was not assessed. Right ventricular systolic function is moderately reduced. Left Atrium: Left atrial size was mildly dilated. No left atrial/left atrial appendage thrombus was detected. Right Atrium: Right atrial size was moderately dilated. Mitral Valve: Mitral valve annulus measures 37 mm The MV appears normal there are multiple jets of MR making quantification diffiicult. The largest jet is directed more posteriorly into LA. The MR appears moderate to moderately severe in severity. (3-4/4). Carpentier Type IIIb. Tricuspid Valve: The tricuspid valve is normal in structure. Tricuspid valve regurgitation is moderate to severe. Aortic Valve: The aortic valve is tricuspid. Aortic valve regurgitation is mild. Mild aortic valve sclerosis is present, with no evidence of aortic valve stenosis. Pulmonic Valve: The pulmonic valve was normal in structure. Pulmonic valve regurgitation is mild. Aorta: Minimal fixed plaquing in the thoracic aorta. The aortic root is normal in size and structure. Pulmonary Artery: Main PA appears dilated at 38 mm.  MR Peak grad:    145.4 mmHg MR Mean grad:    85.0 mmHg MR Vmax:         603.00 cm/s MR Vmean:        422.0 cm/s MR PISA:         1.57 cm MR PISA Eff ROA: 10 mm MR PISA Radius:  0.50 cm PDorris CarnesMD Electronically signed by PDorris CarnesMD Signature Date/Time: 11/21/2019/5:59:37  PM    Final      Subjective: She is feeling better, dyspnea  improved.    Discharge Exam: Vitals:   11/21/19 1652 11/21/19 1716  BP: (!) 80/65 131/87  Pulse: 66 67  Resp: (!) 22 17  Temp: 98 F (36.7 C) 98.4 F (36.9 C)  SpO2:  100%     General: Pt is alert, awake, not in acute distress Cardiovascular: RRR, S1/S2 +, no rubs, no gallops Respiratory: CTA bilaterally, no wheezing, no rhonchi Abdominal: Soft, NT, ND, bowel sounds + Extremities: no edema, no cyanosis    The results of significant diagnostics from this hospitalization (including imaging, microbiology, ancillary and laboratory) are listed below for reference.     Microbiology: Recent Results (from the past 240 hour(s))  Respiratory Panel by RT PCR (Flu A&B, Covid) - Nasopharyngeal Swab     Status: None   Collection Time: 11/16/19 10:09 PM   Specimen: Nasopharyngeal Swab  Result Value Ref Range Status   SARS Coronavirus 2 by RT PCR NEGATIVE NEGATIVE Final    Comment: (NOTE) SARS-CoV-2 target nucleic acids are NOT DETECTED.  The SARS-CoV-2 RNA is generally detectable in upper respiratoy specimens during the acute phase of infection. The lowest concentration of SARS-CoV-2 viral copies this assay can detect is 131 copies/mL. A negative result does not preclude SARS-Cov-2 infection and should not be used as the sole basis for treatment or other patient management decisions. A negative result may occur with  improper specimen collection/handling, submission of specimen other than nasopharyngeal swab, presence of viral mutation(s) within the areas targeted by this assay, and inadequate number of viral copies (<131 copies/mL). A negative result must be combined with clinical observations, patient history, and epidemiological information. The expected result is Negative.  Fact Sheet for Patients:  PinkCheek.be  Fact Sheet for Healthcare Providers:  GravelBags.it  This test is no t yet approved or cleared by the  [place] FDA and  has been authorized for detection and/or diagnosis of SARS-CoV-2 by FDA under an Emergency Use Authorization (EUA). This EUA will remain  in effect (meaning this test can be used) for the duration of the COVID-19 declaration under Section 564(b)(1) of the Act, 21 U.S.C. section 360bbb-3(b)(1), unless the authorization is terminated or revoked sooner.     Influenza A by PCR NEGATIVE NEGATIVE Final   Influenza B by PCR NEGATIVE NEGATIVE Final    Comment: (NOTE) The Xpert Xpress SARS-CoV-2/FLU/RSV assay is intended as an aid in  the diagnosis of influenza from Nasopharyngeal swab specimens and  should not be used as a sole basis for treatment. Nasal washings and  aspirates are unacceptable for Xpert Xpress SARS-CoV-2/FLU/RSV  testing.  Fact Sheet for Patients: PinkCheek.be  Fact Sheet for Healthcare Providers: GravelBags.it  This test is not yet approved or cleared by the [place] FDA and  has been authorized for detection and/or diagnosis of SARS-CoV-2 by  FDA under an Emergency Use Authorization (EUA). This EUA will remain  in effect (meaning this test can be used) for the duration of the  Covid-19 declaration under Section 564(b)(1) of the Act, 21  U.S.C. section 360bbb-3(b)(1), unless the authorization is  terminated or revoked. Performed at [facility], [place] [address] [place], [place] [address]   Urine Culture     Status: Abnormal   Collection Time: 11/18/19  7:53 AM   Specimen: Urine, Random  Result Value Ref Range Status   Specimen Description URINE, RANDOM  Final   Special Requests  Final    NONE Performed at [facility], [place] [address] [place], [place] [address]    Culture 60,000 COLONIES/mL ESCHERICHIA COLI (A)  Final   Report Status 11/20/2019 FINAL  Final   Organism ID, Bacteria ESCHERICHIA COLI (A)  Final      Susceptibility   Escherichia coli - MIC*     AMPICILLIN 4 SENSITIVE Sensitive     CEFAZOLIN <=4 SENSITIVE Sensitive     CEFEPIME <=0.12 SENSITIVE Sensitive     CEFTRIAXONE <=0.25 SENSITIVE Sensitive     CIPROFLOXACIN <=0.25 SENSITIVE Sensitive     GENTAMICIN <=1 SENSITIVE Sensitive     IMIPENEM <=0.25 SENSITIVE Sensitive     NITROFURANTOIN <=16 SENSITIVE Sensitive     TRIMETH/SULFA <=20 SENSITIVE Sensitive     AMPICILLIN/SULBACTAM <=2 SENSITIVE Sensitive     PIP/TAZO <=4 SENSITIVE Sensitive     * 60,000 COLONIES/mL ESCHERICHIA COLI     Labs: BNP (last 3 results) Recent Labs    11/16/19 1956  BNP 2,778.2*   Basic Metabolic Panel: Recent Labs  Lab 11/17/19 0343 11/18/19 0113 11/18/19 0837 11/19/19 0713 11/20/19 0414 11/21/19 0304  NA 138 135  --  136 138 138  K 3.6 4.8  --  4.1 4.0 3.7  CL 104 103  --  103 104 103  CO2 23 22  --  22 25 26   GLUCOSE 94 114*  --  106* 96 99  BUN 9 20  --  27* 26* 21  CREATININE 0.40* 0.90  --  0.94 0.87 0.72  CALCIUM 8.4* 8.6*  --  8.4* 8.6* 8.4*  MG 1.1*  --  1.5*  --  1.8  --    Liver Function Tests: Recent Labs  Lab 11/16/19 1430  AST 23  ALT 9  ALKPHOS 185*  BILITOT 1.8*  PROT 6.9  ALBUMIN 3.3*   No results for input(s): LIPASE, AMYLASE in the last 168 hours. No results for input(s): AMMONIA in the last 168 hours. CBC: Recent Labs  Lab 11/16/19 1430 11/16/19 1956 11/18/19 0837 11/20/19 0414  WBC 6.6 7.1 7.6 7.0  NEUTROABS 4.7 5.0  --   --   HGB 8.9* 8.9* 9.3* 9.6*  HCT 28.4* 29.1* 29.8* 30.8*  MCV 78* 76.8* 75.3* 76.2*  PLT 245 249 266 274   Cardiac Enzymes: No results for input(s): CKTOTAL, CKMB, CKMBINDEX, TROPONINI in the last 168 hours. BNP: Invalid input(s): POCBNP CBG: No results for input(s): GLUCAP in the last 168 hours. D-Dimer No results for input(s): DDIMER in the last 72 hours. Hgb A1c No results for input(s): HGBA1C in the last 72 hours. Lipid Profile No results for input(s): CHOL, HDL, LDLCALC, TRIG, CHOLHDL, LDLDIRECT in the last 72  hours. Thyroid function studies No results for input(s): TSH, T4TOTAL, T3FREE, THYROIDAB in the last 72 hours.  Invalid input(s): FREET3 Anemia work up No results for input(s): VITAMINB12, FOLATE, FERRITIN, TIBC, IRON, RETICCTPCT in the last 72 hours. Urinalysis    Component Value Date/Time   COLORURINE YELLOW 11/17/2019 2145   APPEARANCEUR HAZY (A) 11/17/2019 2145   LABSPEC 1.011 11/17/2019 2145   PHURINE 5.0 11/17/2019 2145   GLUCOSEU NEGATIVE 11/17/2019 2145   HGBUR SMALL (A) 11/17/2019 2145   BILIRUBINUR NEGATIVE 11/17/2019 2145   [place] NEGATIVE 11/17/2019 2145   PROTEINUR 100 (A) 11/17/2019 2145   NITRITE NEGATIVE 11/17/2019 2145   LEUKOCYTESUR NEGATIVE 11/17/2019 2145   Sepsis Labs Invalid input(s): PROCALCITONIN,  WBC,  LACTICIDVEN Microbiology Recent Results (from the past 240  hour(s))  Respiratory Panel by RT PCR (Flu A&B, Covid) - Nasopharyngeal Swab     Status: None   Collection Time: 11/16/19 10:09 PM   Specimen: Nasopharyngeal Swab  Result Value Ref Range Status   SARS Coronavirus 2 by RT PCR NEGATIVE NEGATIVE Final    Comment: (NOTE) SARS-CoV-2 target nucleic acids are NOT DETECTED.  The SARS-CoV-2 RNA is generally detectable in upper respiratoy specimens during the acute phase of infection. The lowest concentration of SARS-CoV-2 viral copies this assay can detect is 131 copies/mL. A negative result does not preclude SARS-Cov-2 infection and should not be used as the sole basis for treatment or other patient management decisions. A negative result may occur with  improper specimen collection/handling, submission of specimen other than nasopharyngeal swab, presence of viral mutation(s) within the areas targeted by this assay, and inadequate number of viral copies (<131 copies/mL). A negative result must be combined with clinical observations, patient history, and epidemiological information. The expected result is Negative.  Fact Sheet for Patients:   PinkCheek.be  Fact Sheet for Healthcare Providers:  GravelBags.it  This test is no t yet approved or cleared by the [place] FDA and  has been authorized for detection and/or diagnosis of SARS-CoV-2 by FDA under an Emergency Use Authorization (EUA). This EUA will remain  in effect (meaning this test can be used) for the duration of the COVID-19 declaration under Section 564(b)(1) of the Act, 21 U.S.C. section 360bbb-3(b)(1), unless the authorization is terminated or revoked sooner.     Influenza A by PCR NEGATIVE NEGATIVE Final   Influenza B by PCR NEGATIVE NEGATIVE Final    Comment: (NOTE) The Xpert Xpress SARS-CoV-2/FLU/RSV assay is intended as an aid in  the diagnosis of influenza from Nasopharyngeal swab specimens and  should not be used as a sole basis for treatment. Nasal washings and  aspirates are unacceptable for Xpert Xpress SARS-CoV-2/FLU/RSV  testing.  Fact Sheet for Patients: PinkCheek.be  Fact Sheet for Healthcare Providers: GravelBags.it  This test is not yet approved or cleared by the [place] FDA and  has been authorized for detection and/or diagnosis of SARS-CoV-2 by  FDA under an Emergency Use Authorization (EUA). This EUA will remain  in effect (meaning this test can be used) for the duration of the  Covid-19 declaration under Section 564(b)(1) of the Act, 21  U.S.C. section 360bbb-3(b)(1), unless the authorization is  terminated or revoked. Performed at [facility], [place] [address] [place], [place] [address]   Urine Culture     Status: Abnormal   Collection Time: 11/18/19  7:53 AM   Specimen: Urine, Random  Result Value Ref Range Status   Specimen Description URINE, RANDOM  Final   Special Requests   Final    NONE Performed at [facility], [place] [address] [place], [place] [address]    Culture 60,000 COLONIES/mL  ESCHERICHIA COLI (A)  Final   Report Status 11/20/2019 FINAL  Final   Organism ID, Bacteria ESCHERICHIA COLI (A)  Final      Susceptibility   Escherichia coli - MIC*    AMPICILLIN 4 SENSITIVE Sensitive     CEFAZOLIN <=4 SENSITIVE Sensitive     CEFEPIME <=0.12 SENSITIVE Sensitive     CEFTRIAXONE <=0.25 SENSITIVE Sensitive     CIPROFLOXACIN <=0.25 SENSITIVE Sensitive     GENTAMICIN <=1 SENSITIVE Sensitive     IMIPENEM <=0.25 SENSITIVE Sensitive     NITROFURANTOIN <=16 SENSITIVE Sensitive     TRIMETH/SULFA <=20  SENSITIVE Sensitive     AMPICILLIN/SULBACTAM <=2 SENSITIVE Sensitive     PIP/TAZO <=4 SENSITIVE Sensitive     * 60,000 COLONIES/mL ESCHERICHIA COLI     Time coordinating discharge: 40 minutes  SIGNED:   Elmarie Shiley, MD  [facility]

## 2019-11-21 NOTE — Interval H&P Note (Signed)
History and Physical Interval Note:  11/21/2019 1:33 PM  Donna Mayer  has presented today for surgery, with the diagnosis of severe MR, mild AI.  The various methods of treatment have been discussed with the patient and family. After consideration of risks, benefits and other options for treatment, the patient has consented to  Procedure(s): TRANSESOPHAGEAL ECHOCARDIOGRAM (TEE) (N/A) as a surgical intervention.  The patient's history has been reviewed, patient examined, no change in status, stable for surgery.  I have reviewed the patient's chart and labs.  Questions were answered to the patient's satisfaction.     [name]

## 2019-11-23 ENCOUNTER — Encounter (HOSPITAL_COMMUNITY): Payer: Self-pay | Admitting: Internal Medicine

## 2019-11-28 ENCOUNTER — Other Ambulatory Visit: Payer: Self-pay
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
## 2019-11-28 NOTE — Patient Outreach (Signed)
[facility]) Care Management  11/28/2019  LOREN VICENS 1955-11-14 [id number]   EMMI- General Discharge RED ON EMMI ALERT Day # 4 Date:  11/27/19 Red Alert Reason:  Lost interest in things? Yes  Outreach attempt: spoke with patient. She reports she is doing good.  Addressed red alert.  Patient reports that the system recorded wrong as she was saying no but it kept recording yes.  Patient reports follow ups scheduled with physicians and denies any concerns.  Patient declined further nurse follow up needs.    Plan: RN CM will close case.  Jone Baseman, RN, MSN [facility] Care Management Care Management Coordinator Direct Line [phone] Toll Free: [phone]  Fax: [phone]

## 2019-11-29 ENCOUNTER — Ambulatory Visit: Payer: Medicare HMO | Admitting: Cardiovascular Disease

## 2019-11-29 ENCOUNTER — Telehealth: Payer: Self-pay | Admitting: Cardiovascular Disease

## 2019-11-29 DIAGNOSIS — I10 Essential (primary) hypertension: Secondary | ICD-10-CM

## 2019-11-29 DIAGNOSIS — I5022 Chronic systolic (congestive) heart failure: Secondary | ICD-10-CM

## 2019-11-29 NOTE — Telephone Encounter (Signed)
That is fine with me.  [place] T. Audie Box, [place]  [address] [place],  [address] [phone]  8:33 AM

## 2019-11-29 NOTE — CV Procedure (Signed)
TEE  Patient sedated by anesthesia with Propofol intravenously Throat anesthetized  Bite guard placed TEE probe advanced to mid esophagus without difficulty  Full report to follow in CV section of chart Procedure was without complications.  [name] MD

## 2019-11-29 NOTE — Telephone Encounter (Signed)
Patient wants a transition of care from Dr. Audie Box to Dr. Bettina Gavia due to residence being closer to [facility]. Please confirm transfer.

## 2019-11-29 NOTE — Telephone Encounter (Signed)
Okay 

## 2019-11-30 ENCOUNTER — Other Ambulatory Visit: Payer: Self-pay

## 2019-11-30 ENCOUNTER — Ambulatory Visit (INDEPENDENT_AMBULATORY_CARE_PROVIDER_SITE_OTHER): Payer: Medicare HMO | Admitting: Family Medicine

## 2019-11-30 ENCOUNTER — Encounter: Payer: Self-pay | Admitting: Family Medicine

## 2019-11-30 VITALS — BP 140/76 | HR 88 | Temp 97.3°F | Resp 18 | Ht 62.0 in | Wt 161.0 lb

## 2019-11-30 DIAGNOSIS — I10 Essential (primary) hypertension: Secondary | ICD-10-CM | POA: Diagnosis not present

## 2019-11-30 DIAGNOSIS — I340 Nonrheumatic mitral (valve) insufficiency: Secondary | ICD-10-CM | POA: Diagnosis not present

## 2019-11-30 DIAGNOSIS — H0520 Unspecified exophthalmos: Secondary | ICD-10-CM

## 2019-11-30 DIAGNOSIS — E0590 Thyrotoxicosis, unspecified without thyrotoxic crisis or storm: Secondary | ICD-10-CM | POA: Diagnosis not present

## 2019-11-30 DIAGNOSIS — N3000 Acute cystitis without hematuria: Secondary | ICD-10-CM

## 2019-11-30 DIAGNOSIS — K219 Gastro-esophageal reflux disease without esophagitis: Secondary | ICD-10-CM

## 2019-11-30 DIAGNOSIS — I5021 Acute systolic (congestive) heart failure: Secondary | ICD-10-CM

## 2019-11-30 LAB — COMPREHENSIVE METABOLIC PANEL
ALT: 16 IU/L (ref 0–32)
AST: 25 IU/L (ref 0–40)
Albumin/Globulin Ratio: 0.9 — ABNORMAL LOW (ref 1.2–2.2)
Albumin: 3.7 g/dL — ABNORMAL LOW (ref 3.8–4.8)
Alkaline Phosphatase: 246 IU/L — ABNORMAL HIGH (ref 44–121)
BUN/Creatinine Ratio: 22 (ref 12–28)
BUN: 11 mg/dL (ref 8–27)
Bilirubin Total: 0.8 mg/dL (ref 0.0–1.2)
CO2: 24 mmol/L (ref 20–29)
Calcium: 9.5 mg/dL (ref 8.7–10.3)
Chloride: 97 mmol/L (ref 96–106)
Creatinine, Ser: 0.5 mg/dL — ABNORMAL LOW (ref 0.57–1.00)
GFR calc Af Amer: 120 mL/min/{1.73_m2} (ref >59)
GFR calc non Af Amer: 104 mL/min/{1.73_m2} (ref >59)
Globulin, Total: 4.1 g/dL (ref 1.5–4.5)
Glucose: 89 mg/dL (ref 65–99)
Potassium: 4.5 mmol/L (ref 3.5–5.2)
Sodium: 134 mmol/L (ref 134–144)
Total Protein: 7.8 g/dL (ref 6.0–8.5)

## 2019-11-30 LAB — CBC WITH DIFFERENTIAL/PLATELET
Basophils Absolute: 0 10*3/uL (ref 0.0–0.2)
Basos: 0 %
EOS (ABSOLUTE): 0.1 10*3/uL (ref 0.0–0.4)
Eos: 3 %
Hematocrit: 35.1 % (ref 34.0–46.6)
Hemoglobin: 10.6 g/dL — ABNORMAL LOW (ref 11.1–15.9)
Immature Grans (Abs): 0 10*3/uL (ref 0.0–0.1)
Immature Granulocytes: 1 %
Lymphocytes Absolute: 1.2 10*3/uL (ref 0.7–3.1)
Lymphs: 25 %
MCH: 23.5 pg — ABNORMAL LOW (ref 26.6–33.0)
MCHC: 30.2 g/dL — ABNORMAL LOW (ref 31.5–35.7)
MCV: 78 fL — ABNORMAL LOW (ref 79–97)
Monocytes Absolute: 0.6 10*3/uL (ref 0.1–0.9)
Monocytes: 13 %
Neutrophils Absolute: 2.9 10*3/uL (ref 1.4–7.0)
Neutrophils: 58 %
Platelets: 299 10*3/uL (ref 150–450)
RBC: 4.51 x10E6/uL (ref 3.77–5.28)
RDW: 16.7 % — ABNORMAL HIGH (ref 11.7–15.4)
WBC: 4.9 10*3/uL (ref 3.4–10.8)

## 2019-11-30 MED ORDER — SYSTANE NIGHTTIME OP OINT: 1.0000 "application " | TOPICAL_OINTMENT | Freq: Every day | OPHTHALMIC | 2 refills | Status: DC

## 2019-11-30 MED ORDER — SYSTANE 0.4-0.3 % OP GEL: 1.0000 "application " | OPHTHALMIC | 2 refills | Status: DC | PRN
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
## 2019-12-02 ENCOUNTER — Telehealth: Payer: Self-pay

## 2019-12-02 DIAGNOSIS — I5020 Unspecified systolic (congestive) heart failure: Secondary | ICD-10-CM | POA: Diagnosis not present

## 2019-12-02 DIAGNOSIS — E0590 Thyrotoxicosis, unspecified without thyrotoxic crisis or storm: Secondary | ICD-10-CM | POA: Diagnosis not present

## 2019-12-02 DIAGNOSIS — E0500 Thyrotoxicosis with diffuse goiter without thyrotoxic crisis or storm: Secondary | ICD-10-CM | POA: Diagnosis not present
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
## 2019-12-05 ENCOUNTER — Encounter (HOSPITAL_COMMUNITY)
Admission: RE | Admit: 2019-12-05 | Discharge: 2019-12-05 | Disposition: A | Discharge status: Home / Self Care | Payer: Medicare HMO | Source: Ambulatory Visit | Attending: Otolaryngology | Admitting: Otolaryngology

## 2019-12-05 ENCOUNTER — Other Ambulatory Visit: Payer: Self-pay

## 2019-12-05 ENCOUNTER — Encounter (HOSPITAL_COMMUNITY): Payer: Self-pay

## 2019-12-05 ENCOUNTER — Other Ambulatory Visit (HOSPITAL_COMMUNITY)
Admission: RE | Admit: 2019-12-05 | Discharge: 2019-12-05 | Disposition: A | Discharge status: Home / Self Care | Payer: Medicare HMO | Source: Ambulatory Visit | Attending: Otolaryngology | Admitting: Otolaryngology

## 2019-12-05 DIAGNOSIS — E0590 Thyrotoxicosis, unspecified without thyrotoxic crisis or storm: Secondary | ICD-10-CM | POA: Insufficient documentation

## 2019-12-05 DIAGNOSIS — Z20822 Contact with and (suspected) exposure to covid-19: Secondary | ICD-10-CM | POA: Insufficient documentation

## 2019-12-05 DIAGNOSIS — Z01812 Encounter for preprocedural laboratory examination: Secondary | ICD-10-CM | POA: Insufficient documentation

## 2019-12-05 LAB — SARS CORONAVIRUS 2 (TAT 6-24 HRS): SARS Coronavirus 2: NEGATIVE

## 2019-12-05 NOTE — Progress Notes (Signed)
PCP - [name] Cardiologist - Dr. Bettina Gavia  Chest x-ray - 11-16-19 EKG - 11-16-19 ECHO - 11-21-19    COVID TEST- 12-05-19   Anesthesia review: yes, EKG, heart history related to thyroid   Patient denies shortness of breath, fever, cough and chest pain at PAT appointment   All instructions explained to the patient, with a verbal understanding of the material. Patient agrees to go over the instructions while at home for a better understanding. Patient also instructed to self quarantine after being tested for COVID-19. The opportunity to ask questions was provided.

## 2019-12-05 NOTE — Progress Notes (Signed)
[id number] [address], [address] AT [address] [place] [address] Phone: [phone] Fax: [facility] [place], [place] [address] Phone: [phone] Fax: [phone]      Your procedure is scheduled on Wednesday, 11/24.  Report to [facility] Main Entrance "A" at 7:00 A.M., and check in at the Admitting office.  Call this number if you have problems the morning of surgery:  [phone]  Call [phone] if you have any questions prior to your surgery date Monday-Friday 8am-4pm    Remember:  Do not eat or drink after midnight the night before your surgery     Take these medicines the morning of surgery with A SIP OF WATER   Clonidine (Catapres)  Nexium - if needed  Eye drops - if needed  Trintellix  As of today, STOP taking any Aspirin (unless otherwise instructed by your surgeon) Aleve, Naproxen, Ibuprofen, Motrin, Advil, Goody's, BC's, all herbal medications, fish oil, and all vitamins.                      Do not wear jewelry, make up, or nail polish            Do not wear lotions, powders, perfumes, or deodorant.            Do not shave 48 hours prior to surgery.              Do not bring valuables to the hospital.            [facility] is not responsible for any belongings or valuables.  Do NOT Smoke (Tobacco/Vaping) or drink Alcohol 24 hours prior to your procedure If you use a CPAP at night, you may bring all equipment for your overnight stay.   Contacts, glasses, dentures or bridgework may not be worn into surgery.      For patients admitted to the hospital, discharge time will be determined by your treatment team.   Patients discharged the day of surgery will not be allowed to drive home, and someone needs to stay with them for 24 hours.    Special instructions:   [facility]- Preparing For  Surgery  Before surgery, you can play an important role. Because skin is not sterile, your skin needs to be as free of germs as possible. You can reduce the number of germs on your skin by washing with CHG (chlorahexidine gluconate) Soap before surgery.  CHG is an antiseptic cleaner which kills germs and bonds with the skin to continue killing germs even after washing.    Oral Hygiene is also important to reduce your risk of infection.  Remember - BRUSH YOUR TEETH THE MORNING OF SURGERY WITH YOUR REGULAR TOOTHPASTE  Please do not use if you have an allergy to CHG or antibacterial soaps. If your skin becomes reddened/irritated stop using the CHG.  Do not shave (including legs and underarms) for at least 48 hours prior to first CHG shower. It is OK to shave your face.  Please follow these instructions carefully.   1. Shower the NIGHT BEFORE SURGERY and the MORNING OF SURGERY with CHG Soap.   2. If you chose to wash your hair, wash your hair first as usual with your normal shampoo.  3. After you shampoo, rinse your hair and body thoroughly to remove  the shampoo.  4. Use CHG as you would any other liquid soap. You can apply CHG directly to the skin and wash gently with a scrungie or a clean washcloth.   5. Apply the CHG Soap to your body ONLY FROM THE NECK DOWN.  Do not use on open wounds or open sores. Avoid contact with your eyes, ears, mouth and genitals (private parts). Wash Face and genitals (private parts)  with your normal soap.   6. Wash thoroughly, paying special attention to the area where your surgery will be performed.  7. Thoroughly rinse your body with warm water from the neck down.  8. DO NOT shower/wash with your normal soap after using and rinsing off the CHG Soap.  9. Pat yourself dry with a CLEAN TOWEL.  10. Wear CLEAN PAJAMAS to bed the night before surgery  11. Place CLEAN SHEETS on your bed the night of your first shower and DO NOT SLEEP WITH PETS.   Day of  Surgery: Wear Clean/Comfortable clothing the morning of surgery Do not apply any deodorants/lotions.   Remember to brush your teeth WITH YOUR REGULAR TOOTHPASTE.   Please read over the following fact sheets that you were given.

## 2019-12-06 NOTE — Anesthesia Preprocedure Evaluation (Addendum)
Anesthesia Evaluation  Patient identified by MRN, date of birth, ID band Patient awake    Reviewed: Allergy & Precautions, NPO status , Patient's Chart, lab work & pertinent test results  Airway Mallampati: III  TM Distance: >3 FB Neck ROM: Full  Mouth opening: Limited Mouth Opening  Dental no notable dental hx. (+) Teeth Intact, Dental Advisory Given   Pulmonary neg pulmonary ROS,    Pulmonary exam normal breath sounds clear to auscultation       Cardiovascular hypertension, +CHF  Normal cardiovascular exam+ Valvular Problems/Murmurs MR  Rhythm:Regular Rate:Normal  TTE 2021 1. Significant hypertrophy of the papillary muscles. . The left ventricle has mild to moderately decreased function.  2. Right ventricular systolic function is moderately reduced. The right ventricular size is mildly enlarged.  3. Left atrial size was mildly dilated. No left atrial/left atrial  appendage thrombus was detected.  4. Right atrial size was moderately dilated.  5. Mitral valve annulus measures 37 mm The MV appears normal. There are  multiple jets of MR making quantification diffiicult. The largest jet is  directed more posteriorly into LA. MR is moderate to moderately severe in  severity. (3-4/4) Carpentier Type  IIIb  6. Tricuspid valve regurgitation is moderate to severe.  7. The aortic valve is tricuspid. Aortic valve regurgitation is mild.  Mild aortic valve sclerosis is present, with no evidence of aortic valve  stenosis.  8. Minimal fixed plaquing in the thoracic aorta.  9. Main PA appears dilated at 38 mm.    Neuro/Psych PSYCHIATRIC DISORDERS Anxiety Depression negative neurological ROS     GI/Hepatic Neg liver ROS, GERD  ,  Endo/Other  Hyperthyroidism   Renal/GU negative Renal ROS  negative genitourinary   Musculoskeletal negative musculoskeletal ROS (+)   Abdominal   Peds  Hematology  (+) Blood dyscrasia (Hgb  10.6), anemia ,   Anesthesia Other Findings   Reproductive/Obstetrics                            Anesthesia Physical Anesthesia Plan  ASA: III  Anesthesia Plan: General   Post-op Pain Management:    Induction: Intravenous  PONV Risk Score and Plan: 3 and Midazolam, Dexamethasone and Ondansetron  Airway Management Planned: Oral ETT  Additional Equipment:   Intra-op Plan:   Post-operative Plan: Extubation in OR  Informed Consent: I have reviewed the patients History and Physical, chart, labs and discussed the procedure including the risks, benefits and alternatives for the proposed anesthesia with the patient or authorized representative who has indicated his/her understanding and acceptance.     Dental advisory given  Plan Discussed with: CRNA  Anesthesia Plan Comments:        Anesthesia Quick Evaluation

## 2019-12-06 NOTE — Progress Notes (Signed)
Anesthesia Chart Review:  Patient was recently admitted November 3 through November 22, 2019 for new onset systolic heart failure.  Patient has had uncontrolled Graves' disease over the last year.    Patient presented to the ED she was tachypneic saturating in the low 90s, chest x-ray consistent with congestive heart failure (pulmonary edema), BNP elevated.  The patient received IV Lasix for diuresis and significantly improved.  Her weight dropped from 187-171 and she had 7 L of urine output.  She was also treated for UTI.  Echocardiogram showed a low ejection fraction of 45% with hypokinesis, mitral valve regurgitation.  She subsequently underwent a TEE which showed moderate to moderately severe mitral regurgitation.  She was seen by cardiology during admission it was felt that her new onset heart failure was secondary to poorly controlled hypothyroidism.  Her troponin elevation was thought to be related to congestive heart failure versus having an acute myocardial infarction.  Upcoming thyroid surgery was discussed in cardiology note 11/21/2019, "-She has had atypical right-sided chest pain. -Troponin enzymes are flat and negative for ACS.  Symptoms are inconsistent with cardiac chest pain. -We initially thought she would be a good candidate for cardiac CTA.  However given hyperthyroid state, this may create issues.  Contrast for left heart catheterization would also create an issue.  Nuclear medicine stress testing is unreliable.  I would recommend she forego testing at this time and have her thyroid addressed with surgery.  She can then have a repeat echocardiogram and determine if further testing is needed."  Post discharge patient followed up with her PCP Dr. Dirk Dress on 11/30/2019.  Per note, "today the patient presents feeling much better.  She denies shortness of breath, swelling, chest pain, fatigue.  She is taking her methimazole and says she is tolerating it well.  She says she has been compliant  with all of her medications. The patient was supposed to see an eye doctor for the proptosis related to her Graves' disease.  She was unable to get an appointment until December.  I spoke with Dr. Gilford Rile and he felt that this was okay as long she is getting lubricating drops/ointment.  He did not feel this was any reason to delay her surgery."   Hypertension was not well controlled at preadmission testing. 187/93. She has had difficulty with this over the past year due to hyperthyroidism.   CMP and CBC from 11/30/2019 reviewed.  Hemoglobin mildly low at 10.6.  EKG 11/16/2019 (at time of new onset systolic heart failure): Sinus tachycardia.  Rate 126. Anterior infarct, old. Nonspecific T abnormalities, lateral leads. Prolonged QT interval (QTC 538)  TEE 11/21/2019: 1. Significant hypertrophy of the papillary muscles. . The left ventricle  has mild to moderately decreased function.  2. Right ventricular systolic function is moderately reduced. The right  ventricular size is mildly enlarged.  3. Left atrial size was mildly dilated. No left atrial/left atrial  appendage thrombus was detected.  4. Right atrial size was moderately dilated.  5. Mitral valve annulus measures 37 mm The MV appears normal. There are  multiple jets of MR making quantification diffiicult. The largest jet is  directed more posteriorly into LA. MR is moderate to moderately severe in  severity. (3-4/4) Carpentier Type  IIIb  6. Tricuspid valve regurgitation is moderate to severe.  7. The aortic valve is tricuspid. Aortic valve regurgitation is mild.  Mild aortic valve sclerosis is present, with no evidence of aortic valve  stenosis.  8. Minimal fixed  plaquing in the thoracic aorta.  9. Main PA appears dilated at 38 mm.   FINDINGS  Left Ventricle: Significant hypertrophy of the papillary muscles. The  left ventricle has mild to moderately decreased function. The left  ventricular internal cavity size was normal  in size.   TTE 11/18/2019: 1. There is hypokinesis of the inferior, inferolateral walls . Left  ventricular ejection fraction, by estimation, is 40 to 45%. The left  ventricle has mildly decreased function. The left ventricle demonstrates  regional wall motion abnormalities (see  scoring diagram/findings for description). There is mild left ventricular  hypertrophy. Left ventricular diastolic parameters are consistent with  Grade I diastolic dysfunction (impaired relaxation).  2. Right ventricular systolic function is normal. The right ventricular  size is normal. There is normal pulmonary artery systolic pressure.  3. Left atrial size was moderately dilated.  4. Right atrial size was moderately dilated.  5. There appear to be a couple jets of MR making quantificaiton of  severity difficult   The predeminant jets are posteriorly and central. MR is at least  moderately severe to severe . COnsider TEE to further define valve  function. .Moderate to severe mitral valve regurgitation.  6. Tricuspid valve regurgitation is moderate.  7. The aortic valve is normal in structure. Aortic valve regurgitation is  mild.  8. The inferior vena cava is dilated in size with <50% respiratory  variability, suggesting right atrial pressure of 15 mmHg.     Wynonia Musty [facility]/Anesthesiology Phone [phone] 12/06/2019 9:14 AM

## 2019-12-06 NOTE — Telephone Encounter (Signed)
Error. kc

## 2019-12-07 ENCOUNTER — Encounter (HOSPITAL_COMMUNITY)
Admission: RE | Disposition: A | Discharge status: Home / Self Care | Payer: Self-pay | Source: Home / Self Care | Attending: Otolaryngology

## 2019-12-07 ENCOUNTER — Encounter (HOSPITAL_COMMUNITY): Payer: Self-pay | Admitting: Otolaryngology

## 2019-12-07 ENCOUNTER — Inpatient Hospital Stay (HOSPITAL_COMMUNITY): Payer: Medicare HMO | Admitting: Physician Assistant

## 2019-12-07 ENCOUNTER — Inpatient Hospital Stay (HOSPITAL_COMMUNITY): Payer: Medicare HMO | Admitting: Certified Registered Nurse Anesthetist

## 2019-12-07 ENCOUNTER — Observation Stay (HOSPITAL_COMMUNITY)
Admission: RE | Admit: 2019-12-07 | Discharge: 2019-12-08 | Disposition: A | Discharge status: Home / Self Care | Payer: Medicare HMO | Attending: Otolaryngology | Admitting: Otolaryngology

## 2019-12-07 DIAGNOSIS — E0590 Thyrotoxicosis, unspecified without thyrotoxic crisis or storm: Secondary | ICD-10-CM | POA: Diagnosis not present

## 2019-12-07 DIAGNOSIS — Z79899 Other long term (current) drug therapy: Secondary | ICD-10-CM | POA: Diagnosis not present

## 2019-12-07 DIAGNOSIS — E039 Hypothyroidism, unspecified: Principal | ICD-10-CM | POA: Insufficient documentation

## 2019-12-07 DIAGNOSIS — I509 Heart failure, unspecified: Secondary | ICD-10-CM | POA: Insufficient documentation

## 2019-12-07 DIAGNOSIS — E041 Nontoxic single thyroid nodule: Secondary | ICD-10-CM | POA: Diagnosis not present

## 2019-12-07 DIAGNOSIS — E782 Mixed hyperlipidemia: Secondary | ICD-10-CM | POA: Diagnosis not present

## 2019-12-07 DIAGNOSIS — I110 Hypertensive heart disease with heart failure: Secondary | ICD-10-CM | POA: Insufficient documentation

## 2019-12-07 DIAGNOSIS — E0789 Other specified disorders of thyroid: Secondary | ICD-10-CM | POA: Diagnosis not present

## 2019-12-07 HISTORY — PX: THYROIDECTOMY: SHX17

## 2019-12-07 LAB — CALCIUM
Calcium: 9.1 mg/dL (ref 8.9–10.3)
Calcium: 8.7 mg/dL — ABNORMAL LOW (ref 8.9–10.3)

## 2019-12-07 SURGERY — THYROIDECTOMY
Anesthesia: General | Site: Neck

## 2019-12-07 MED ORDER — LABETALOL HCL 5 MG/ML IV SOLN
INTRAVENOUS | Status: AC
  Administered 2019-12-07: 5 mg
  Filled 2019-12-07: qty 4

## 2019-12-07 MED ORDER — ORAL CARE MOUTH RINSE: 15.0000 mL | Freq: Once | OROMUCOSAL | Status: AC

## 2019-12-07 MED ORDER — PROPOFOL 10 MG/ML IV BOLUS
INTRAVENOUS | Status: DC | PRN
  Administered 2019-12-07: 130 mg via INTRAVENOUS

## 2019-12-07 MED ORDER — CHLORHEXIDINE GLUCONATE 0.12 % MT SOLN
15.0000 mL | Freq: Once | OROMUCOSAL | Status: AC
  Administered 2019-12-07: 15 mL via OROMUCOSAL
  Filled 2019-12-07: qty 15

## 2019-12-07 MED ORDER — LIDOCAINE-EPINEPHRINE 1 %-1:100000 IJ SOLN
INTRAMUSCULAR | Status: AC
  Filled 2019-12-07: qty 1

## 2019-12-07 MED ORDER — CEFAZOLIN SODIUM-DEXTROSE 2-4 GM/100ML-% IV SOLN
2.0000 g | INTRAVENOUS | Status: AC
  Administered 2019-12-07: 2 g via INTRAVENOUS
  Filled 2019-12-07: qty 100

## 2019-12-07 MED ORDER — FUROSEMIDE 40 MG PO TABS
40.0000 mg | ORAL_TABLET | Freq: Every day | ORAL | Status: DC
  Administered 2019-12-07: 40 mg via ORAL
  Filled 2019-12-07: qty 1

## 2019-12-07 MED ORDER — ONDANSETRON HCL 4 MG/2ML IJ SOLN
INTRAMUSCULAR | Status: DC | PRN
  Administered 2019-12-07: 4 mg via INTRAVENOUS

## 2019-12-07 MED ORDER — LEVOTHYROXINE SODIUM 125 MCG PO TABS: 125.0000 ug | ORAL_TABLET | Freq: Every day | ORAL | Status: DC

## 2019-12-07 MED ORDER — ZOLPIDEM TARTRATE 5 MG PO TABS: 5.0000 mg | ORAL_TABLET | Freq: Every evening | ORAL | Status: DC | PRN

## 2019-12-07 MED ORDER — FENTANYL CITRATE (PF) 100 MCG/2ML IJ SOLN
INTRAMUSCULAR | Status: DC | PRN
  Administered 2019-12-07: 100 ug via INTRAVENOUS

## 2019-12-07 MED ORDER — LABETALOL HCL 5 MG/ML IV SOLN: 5.0000 mg | Freq: Four times a day (QID) | INTRAVENOUS | Status: DC | PRN

## 2019-12-07 MED ORDER — VORTIOXETINE HBR 5 MG PO TABS
10.0000 mg | ORAL_TABLET | Freq: Every day | ORAL | Status: DC
  Filled 2019-12-07: qty 2

## 2019-12-07 MED ORDER — DEXAMETHASONE SODIUM PHOSPHATE 10 MG/ML IJ SOLN
INTRAMUSCULAR | Status: DC | PRN
  Administered 2019-12-07: 10 mg via INTRAVENOUS

## 2019-12-07 MED ORDER — CEFAZOLIN SODIUM-DEXTROSE 1-4 GM/50ML-% IV SOLN
1.0000 g | Freq: Three times a day (TID) | INTRAVENOUS | Status: DC
  Administered 2019-12-07 – 2019-12-08 (×2): 1 g via INTRAVENOUS
  Filled 2019-12-07 (×3): qty 50

## 2019-12-07 MED ORDER — KCL IN DEXTROSE-NACL 20-5-0.45 MEQ/L-%-% IV SOLN
INTRAVENOUS | Status: DC
  Filled 2019-12-07 (×2): qty 1000

## 2019-12-07 MED ORDER — GLYCOPYRROLATE PF 0.2 MG/ML IJ SOSY
PREFILLED_SYRINGE | INTRAMUSCULAR | Status: DC | PRN
  Administered 2019-12-07: .2 mg via INTRAVENOUS

## 2019-12-07 MED ORDER — FENTANYL CITRATE (PF) 100 MCG/2ML IJ SOLN: 25.0000 ug | INTRAMUSCULAR | Status: DC | PRN

## 2019-12-07 MED ORDER — LIDOCAINE HCL (CARDIAC) PF 100 MG/5ML IV SOSY
PREFILLED_SYRINGE | INTRAVENOUS | Status: DC | PRN
  Administered 2019-12-07: 60 mg via INTRAVENOUS

## 2019-12-07 MED ORDER — MAGNESIUM OXIDE 400 (241.3 MG) MG PO TABS
400.0000 mg | ORAL_TABLET | Freq: Every day | ORAL | Status: DC
  Administered 2019-12-07: 400 mg via ORAL
  Filled 2019-12-07: qty 1

## 2019-12-07 MED ORDER — SODIUM CHLORIDE 0.9 % IV SOLN
0.0125 ug/kg/min | INTRAVENOUS | Status: DC
  Administered 2019-12-07: .15 ug/kg/min via INTRAVENOUS
  Filled 2019-12-07 (×2): qty 2000

## 2019-12-07 MED ORDER — ONDANSETRON HCL 4 MG PO TABS: 4.0000 mg | ORAL_TABLET | ORAL | Status: DC | PRN

## 2019-12-07 MED ORDER — HYDROCODONE-ACETAMINOPHEN 5-325 MG PO TABS
1.0000 | ORAL_TABLET | ORAL | Status: DC | PRN
  Administered 2019-12-07 – 2019-12-08 (×2): 1 via ORAL
  Filled 2019-12-07 (×2): qty 1

## 2019-12-07 MED ORDER — 0.9 % SODIUM CHLORIDE (POUR BTL) OPTIME
TOPICAL | Status: DC | PRN
  Administered 2019-12-07: 1000 mL

## 2019-12-07 MED ORDER — ONDANSETRON HCL 4 MG/2ML IJ SOLN: 4.0000 mg | INTRAMUSCULAR | Status: DC | PRN

## 2019-12-07 MED ORDER — MORPHINE SULFATE (PF) 2 MG/ML IV SOLN: 2.0000 mg | INTRAVENOUS | Status: DC | PRN

## 2019-12-07 MED ORDER — PANTOPRAZOLE SODIUM 40 MG PO TBEC: 80.0000 mg | DELAYED_RELEASE_TABLET | Freq: Every day | ORAL | Status: DC

## 2019-12-07 MED ORDER — METHIMAZOLE 10 MG PO TABS
10.0000 mg | ORAL_TABLET | Freq: Three times a day (TID) | ORAL | Status: DC
  Filled 2019-12-07 (×3): qty 1

## 2019-12-07 MED ORDER — EPHEDRINE SULFATE-NACL 50-0.9 MG/10ML-% IV SOSY
PREFILLED_SYRINGE | INTRAVENOUS | Status: DC | PRN
  Administered 2019-12-07: 15 mg via INTRAVENOUS

## 2019-12-07 MED ORDER — PHENYLEPHRINE HCL-NACL 10-0.9 MG/250ML-% IV SOLN
INTRAVENOUS | Status: DC | PRN
  Administered 2019-12-07: 25 ug/min via INTRAVENOUS

## 2019-12-07 MED ORDER — FENTANYL CITRATE (PF) 250 MCG/5ML IJ SOLN
INTRAMUSCULAR | Status: AC
  Filled 2019-12-07: qty 5

## 2019-12-07 MED ORDER — LIDOCAINE-EPINEPHRINE 1 %-1:100000 IJ SOLN
INTRAMUSCULAR | Status: DC | PRN
  Administered 2019-12-07: 3 mL

## 2019-12-07 MED ORDER — SUCCINYLCHOLINE CHLORIDE 200 MG/10ML IV SOSY
PREFILLED_SYRINGE | INTRAVENOUS | Status: DC | PRN
  Administered 2019-12-07: 140 mg via INTRAVENOUS

## 2019-12-07 MED ORDER — ARTIFICIAL TEARS OPHTHALMIC OINT
1.0000 "application " | TOPICAL_OINTMENT | Freq: Every day | OPHTHALMIC | Status: DC
  Administered 2019-12-07: 1 via OPHTHALMIC
  Filled 2019-12-07: qty 3.5

## 2019-12-07 MED ORDER — LEVOTHYROXINE SODIUM 125 MCG PO TABS
125.0000 ug | ORAL_TABLET | Freq: Every day | ORAL | Status: DC
  Administered 2019-12-08: 125 ug via ORAL
  Filled 2019-12-07 (×2): qty 1

## 2019-12-07 MED ORDER — LABETALOL HCL 5 MG/ML IV SOLN
INTRAVENOUS | Status: AC
  Filled 2019-12-07: qty 4

## 2019-12-07 MED ORDER — PROPRANOLOL HCL 60 MG PO TABS
60.0000 mg | ORAL_TABLET | ORAL | Status: AC
  Administered 2019-12-07: 60 mg via ORAL
  Filled 2019-12-07 (×2): qty 1

## 2019-12-07 MED ORDER — MIDAZOLAM HCL 2 MG/2ML IJ SOLN
INTRAMUSCULAR | Status: AC
  Filled 2019-12-07: qty 2

## 2019-12-07 MED ORDER — STERILE WATER FOR IRRIGATION IR SOLN
Status: DC | PRN
  Administered 2019-12-07: 1000 mL

## 2019-12-07 MED ORDER — LABETALOL HCL 5 MG/ML IV SOLN
5.0000 mg | Freq: Once | INTRAVENOUS | Status: AC
  Administered 2019-12-07: 5 mg via INTRAVENOUS

## 2019-12-07 MED ORDER — ARTIFICIAL TEARS OPHTHALMIC OINT: 1.0000 "application " | TOPICAL_OINTMENT | OPHTHALMIC | Status: DC | PRN

## 2019-12-07 MED ORDER — PROPRANOLOL HCL 20 MG PO TABS
60.0000 mg | ORAL_TABLET | Freq: Two times a day (BID) | ORAL | Status: DC
  Administered 2019-12-07: 60 mg via ORAL
  Filled 2019-12-07 (×2): qty 3

## 2019-12-07 MED ORDER — IRBESARTAN 75 MG PO TABS
75.0000 mg | ORAL_TABLET | Freq: Every day | ORAL | Status: DC
  Administered 2019-12-07: 75 mg via ORAL
  Filled 2019-12-07: qty 1

## 2019-12-07 MED ORDER — CLONIDINE HCL 0.1 MG PO TABS
0.1000 mg | ORAL_TABLET | Freq: Two times a day (BID) | ORAL | Status: DC
  Administered 2019-12-07: 0.1 mg via ORAL
  Filled 2019-12-07: qty 1

## 2019-12-07 MED ORDER — LACTATED RINGERS IV SOLN: INTRAVENOUS | Status: DC

## 2019-12-07 MED ORDER — MIDAZOLAM HCL 5 MG/5ML IJ SOLN
INTRAMUSCULAR | Status: DC | PRN
  Administered 2019-12-07 (×2): 1 mg via INTRAVENOUS

## 2019-12-07 MED ORDER — FERROUS SULFATE 325 (65 FE) MG PO TABS: 325.0000 mg | ORAL_TABLET | Freq: Every day | ORAL | Status: DC

## 2019-12-07 MED ORDER — PROPOFOL 10 MG/ML IV BOLUS
INTRAVENOUS | Status: AC
  Filled 2019-12-07: qty 20

## 2019-12-07 SURGICAL SUPPLY — 38 items
BLADE SURG 15 STRL LF DISP TIS (BLADE) ×1 IMPLANT
BLADE SURG 15 STRL SS (BLADE) ×3
CLEANER TIP ELECTROSURG 2X2 (MISCELLANEOUS) ×3 IMPLANT
CNTNR URN SCR LID CUP LEK RST (MISCELLANEOUS) IMPLANT
CONT SPEC 4OZ STRL OR WHT (MISCELLANEOUS)
CORD BIPOLAR FORCEPS 12FT (ELECTRODE) ×3 IMPLANT
COVER SURGICAL LIGHT HANDLE (MISCELLANEOUS) ×3 IMPLANT
DERMABOND ADVANCED (GAUZE/BANDAGES/DRESSINGS) ×2
DERMABOND ADVANCED .7 DNX12 (GAUZE/BANDAGES/DRESSINGS) ×1 IMPLANT
DRAIN JACKSON RD 7FR 3/32 (WOUND CARE) ×3 IMPLANT
ELECT COATED BLADE 2.86 ST (ELECTRODE) ×3 IMPLANT
ELECT REM PT RETURN 9FT ADLT (ELECTROSURGICAL) ×3
ELECTRODE REM PT RTRN 9FT ADLT (ELECTROSURGICAL) ×1 IMPLANT
EVACUATOR SILICONE 100CC (DRAIN) ×3 IMPLANT
FORCEPS BIPOLAR SPETZLER 8 1.0 (NEUROSURGERY SUPPLIES) ×3 IMPLANT
GAUZE 4X4 16PLY RFD (DISPOSABLE) ×3 IMPLANT
GLOVE BIO SURGEON STRL SZ7.5 (GLOVE) ×3 IMPLANT
GOWN STRL REUS W/ TWL LRG LVL3 (GOWN DISPOSABLE) ×2 IMPLANT
GOWN STRL REUS W/TWL LRG LVL3 (GOWN DISPOSABLE) ×6
KIT BASIN OR (CUSTOM PROCEDURE TRAY) ×3 IMPLANT
KIT TURNOVER KIT B (KITS) ×3 IMPLANT
NEEDLE HYPO 25GX1X1/2 BEV (NEEDLE) ×3 IMPLANT
NS IRRIG 1000ML POUR BTL (IV SOLUTION) ×3 IMPLANT
PAD ARMBOARD 7.5X6 YLW CONV (MISCELLANEOUS) ×6 IMPLANT
PENCIL SMOKE EVACUATOR (MISCELLANEOUS) ×3 IMPLANT
POSITIONER HEAD DONUT 9IN (MISCELLANEOUS) ×3 IMPLANT
PROBE NERVBE PRASS .33 (MISCELLANEOUS) ×3 IMPLANT
SHEARS HARMONIC 9CM CVD (BLADE) ×3 IMPLANT
SPONGE INTESTINAL PEANUT (DISPOSABLE) ×3 IMPLANT
STAPLER VISISTAT 35W (STAPLE) ×3 IMPLANT
SUT ETHILON 2 0 FS 18 (SUTURE) ×3 IMPLANT
SUT SILK 3 0 REEL (SUTURE) ×3 IMPLANT
SUT VIC AB 3-0 SH 27 (SUTURE) ×3
SUT VIC AB 3-0 SH 27X BRD (SUTURE) ×1 IMPLANT
SUT VICRYL 4-0 PS2 18IN ABS (SUTURE) ×3 IMPLANT
TOWEL GREEN STERILE FF (TOWEL DISPOSABLE) ×3 IMPLANT
TRAY ENT MC OR (CUSTOM PROCEDURE TRAY) ×3 IMPLANT
TUBE ENDOTRAC EMG 7X10.2 (MISCELLANEOUS) ×3 IMPLANT

## 2019-12-07 NOTE — Transfer of Care (Signed)
Immediate Anesthesia Transfer of Care Note  Patient: Donna Mayer  Procedure(s) Performed: TOTAL THYROIDECTOMY (N/A Neck)  Patient Location: PACU  Anesthesia Type:General  Level of Consciousness: awake, alert  and oriented  Airway & Oxygen Therapy: Patient Spontanous Breathing and Patient connected to face mask oxygen  Post-op Assessment: Report given to RN and Post -op Vital signs reviewed and stable  Post vital signs: Reviewed and stable  Last Vitals:  Vitals Value Taken Time  BP 175/87 12/07/19 1111  Temp 35.7 C 12/07/19 1109  Pulse 70 12/07/19 1121  Resp 21 12/07/19 1121  SpO2 100 % 12/07/19 1121  Vitals shown include unvalidated device data.  Last Pain:  Vitals:   12/07/19 1109  TempSrc:   PainSc: 0-No pain         Complications: No complications documented.

## 2019-12-07 NOTE — Progress Notes (Signed)
ENT Post Operative Note  Subjective: Patient seen and examined at bedside, she reports mild pain over her neck.  She is tolerating p.o. without difficulty, ambulating to bathroom without issue.  She denies perioral numbness or tingling.   Vitals:   12/07/19 1454 12/07/19 1756  BP: (!) 146/75 (!) 163/92  Pulse: 65 65  Resp:  18  Temp: 97.9 F (36.6 C) 97.9 F (36.6 C)  SpO2: 100% 98%     OBJECTIVE  Gen: alert, cooperative, appropriate Head/ENT: EOMI, neck supple, mucus membranes moist and pink, conjunctiva clear, neck incision clean, dry, and intact with Dermabond overlying indication, 7 French drain exiting neck incision laterally with serosanguineous drainage.  Respiratory: Voice intact, hoarse, no voice breaks, non-labored breathing, no accessory muscle use, normal HR, good O2 saturations  ASSESS/ PLAN  Donna Mayer is a 63 y.o. female who is POD 1 from total thyroidectomy with Dr. Redmond Baseman.  -First postoperative calcium level within normal limits, patient progressing well in acute postoperative phase.  Continue pain management, Q8H calcium checks.  Monitor and record drain output.  Encourage ambulation to tolerance.  Likely discharge tomorrow pending continued clinical stability.   Jason Coop, [facility] Cell: [phone]
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
## 2019-12-07 NOTE — Anesthesia Procedure Notes (Signed)
Procedure Name: Intubation Date/Time: 12/07/2019 9:16 AM Performed by: Candis Shine, CRNA Pre-anesthesia Checklist: Patient identified, Emergency Drugs available, Suction available and Patient being monitored Patient Re-evaluated:Patient Re-evaluated prior to induction Oxygen Delivery Method: Circle System Utilized Preoxygenation: Pre-oxygenation with 100% oxygen Induction Type: IV induction Laryngoscope Size: Glidescope Grade View: Grade I Tube type: Oral Tube size: 7.0 (NIMs ) mm Number of attempts: 1 Airway Equipment and Method: Stylet Placement Confirmation: ETT inserted through vocal cords under direct vision,  positive ETCO2 and breath sounds checked- equal and bilateral Tube secured with: Tape Dental Injury: Teeth and Oropharynx as per pre-operative assessment  Comments: Performed by Desma Mcgregor

## 2019-12-07 NOTE — Anesthesia Postprocedure Evaluation (Signed)
Anesthesia Post Note  Patient: Donna Mayer  Procedure(s) Performed: TOTAL THYROIDECTOMY (N/A Neck)     Patient location during evaluation: PACU Anesthesia Type: General Level of consciousness: awake and alert Pain management: pain level controlled Vital Signs Assessment: post-procedure vital signs reviewed and stable Respiratory status: spontaneous breathing, nonlabored ventilation, respiratory function stable and patient connected to nasal cannula oxygen Cardiovascular status: blood pressure returned to baseline and stable Postop Assessment: no apparent nausea or vomiting Anesthetic complications: no   No complications documented.  Last Vitals:  Vitals:   12/07/19 1320 12/07/19 1454  BP: (!) 183/103 (!) 146/75  Pulse: 63   Resp: 17   Temp: 36.6 C   SpO2: 100%     Last Pain:  Vitals:   12/07/19 1320  TempSrc: Oral  PainSc: 1                  Kristiana Jacko L Trason [place]

## 2019-12-07 NOTE — Brief Op Note (Signed)
12/07/2019  11:02 AM  PATIENT:  Donna Mayer  64 y.o. female  PRE-OPERATIVE DIAGNOSIS:  Hyperthyroidism  POST-OPERATIVE DIAGNOSIS:  Hyperthyroidism  PROCEDURE:  Procedure(s): TOTAL THYROIDECTOMY (N/A)  SURGEON:  Surgeon(s) and Role:    * Melida Quitter, MD - Primary    * Skotnicki, Meghan A, DO - Assisting  PHYSICIAN ASSISTANT:   ASSISTANTS: As above   ANESTHESIA:   general  EBL:  Minimal  BLOOD ADMINISTERED:none  DRAINS: (7 Fr) Jackson-Pratt drain(s) with closed bulb suction in the neck   LOCAL MEDICATIONS USED:  LIDOCAINE   SPECIMEN:  Source of Specimen:  total thyroid  DISPOSITION OF SPECIMEN:  PATHOLOGY  COUNTS:  YES  TOURNIQUET:  * No tourniquets in log *  DICTATION: .Note written in EPIC  PLAN OF CARE: Admit for overnight observation  PATIENT DISPOSITION:  PACU - hemodynamically stable.   Delay start of Pharmacological VTE agent (>24hrs) due to surgical blood loss or risk of bleeding: yes

## 2019-12-07 NOTE — Op Note (Signed)
Preop diagnosis: Hyperthyroidism Postop diagnosis: same Procedure: Total thyroidectomy Surgeon: Redmond Baseman Assist: Skotnicki, who was necessary to assist with retraction and dissection Anesth: General and local with 1% lidocaine with 1:100,000 epinephrine Compl: None Findings: No specific abnormality identified.  Both recurrent laryngeal nerves stimulated at the end of the case.  Parathyroid glands not visualized. Description:  After discussing risks, benefits, and alternatives, the patient was brought to the operative suite and placed on the operative table in the supine position.  Anesthesia was induced and the patient was intubated by the anesthesia team using a nerve monitor endotracheal tube without difficulty.  The eyes were taped closed and a shoulder roll was placed.  The thyroid incision was marked with a marking pen and injected with local anesthetic.  The neck was prepped and draped in sterile fashion.  The incision was made with a 15 blade scalpel and extended through the subcutaneous and platysma layers with Bovie electrocautery.  The midline raphe of the strap muscles was divided and strap muscles retracted over the left lobe.  Dissection was then performed around the lobe using the harmonic scalpel includig division of the superior and inferior pedicles and dissection around the lateral aspect.  The parathyroid glands were not visualized but dissection was kept along the thyroid capsule.  Dissection continued around the deep aspect of the lobe while retracting the lobe.  Staying along the capsule, the dissection continued to Berry's ligament that was dissected.  The recurrent nerve was not yet visualized.  At this point, dissection shifted to the right lobe that was dissected free from surrounding fascial connections.  The superior and inferior pedicles were divided using the harmonic scalpel.  Dissection around the lateral aspect of the gland yielded visualization of the recurrent nerve that was  confirmed with the nerve stimulator.  Dissection inferiorly was required around a small accessory lobule of thyroid tissue that was removed separately.  With further dissection, the gland was freed from the location of the recurrent nerve and Berry's ligament ws divided.  The gland was fully dissected free and passed to nursing for pathology.  The recurrent laryngeal nerves were then both stimulated well with the nerve stimulator.  Bleeding was controlled with Bipolar electrocautery after a Valsalva was given.  After irrigating copiously, a 7 french drain was placed in the depth of the wound and secured at the skin using 2-0 Nylon.   The strap muscles were closed loosely with a single 3-0 Vicryl suture.  The platysma layer was closed with 3-0 Vicryl in a simple, interrupted fashion.  The subcutaneous layer was similarly closed with 4-0 Vicryl and the skin was closed with glue.  Drapes were removed and the patient was cleaned off.  The drain was taped to the left shoulder and placed on bulb suction.  She was returned to anesthesia for wake-up, was extubated, and moved to the recovery room in stable condition.

## 2019-12-08 ENCOUNTER — Encounter (HOSPITAL_COMMUNITY): Payer: Self-pay | Admitting: Otolaryngology

## 2019-12-08 DIAGNOSIS — I509 Heart failure, unspecified: Secondary | ICD-10-CM | POA: Diagnosis not present

## 2019-12-08 DIAGNOSIS — I110 Hypertensive heart disease with heart failure: Secondary | ICD-10-CM | POA: Diagnosis not present

## 2019-12-08 DIAGNOSIS — E039 Hypothyroidism, unspecified: Secondary | ICD-10-CM | POA: Diagnosis not present

## 2019-12-08 DIAGNOSIS — Z79899 Other long term (current) drug therapy: Secondary | ICD-10-CM | POA: Diagnosis not present

## 2019-12-08 LAB — CALCIUM: Calcium: 8.6 mg/dL — ABNORMAL LOW (ref 8.9–10.3)

## 2019-12-08 NOTE — Progress Notes (Signed)
Pt IV removed, catheter intact. Discharge instructions reviewed with pt and daughter. All questions answered. Hard copy of instructions given to patient.  Pt and daughter ambulated out for discharge, ride home in private transportation.

## 2019-12-08 NOTE — Discharge Summary (Signed)
Physician Discharge Summary  Patient ID: Donna Mayer MRN: [id number] DOB/AGE: 03/06/1955 64 y.o.  Admit date: 12/07/2019 Discharge date: 12/08/2019  Admission Diagnoses: Hyperthyroidism  Discharge Diagnoses:  Active Problems:   Hyperthyroidism   Discharged Condition: good  Hospital Course: 64 year old female with Grave's disease and not a candidate for radioactive iodine to treat her hyperthyroidism presented for thyroidectomy.  See operative note.  She was observed overnight with a drain in place and did well.  Her calcium level was monitored and stayed relatively stable.  She is felt stable for discharge home on POD 1 after drain removal.  Consults: None  Significant Diagnostic Studies: None  Treatments: surgery: Total thyroidectomy  Discharge Exam: Blood pressure (!) 145/72, pulse (!) 53, temperature (!) 97.4 F (36.3 C), temperature source Oral, resp. rate 17, height 5' 2"  (1.575 m), weight 73 kg, last menstrual period 04/14/2010, SpO2 100 %. General appearance: alert, cooperative and no distress Neck: incision clean and intact, drain removed, no fluid collection, normal voice  Disposition: Discharge disposition: 01-Home or Self Care       Discharge Instructions    Diet - low sodium heart healthy   Complete by: As directed    Discharge instructions   Complete by: As directed    Resume normal diet as able.  OK to shower incision, gently pat dry.  Do not apply ointment to the incision.  Pain medicine and thyroid replacement medicine already sent to your pharmacy.   Increase activity slowly   Complete by: As directed    No wound care   Complete by: As directed      Allergies as of 12/08/2019   No Known Allergies     Medication List    TAKE these medications   cloNIDine 0.1 MG tablet Commonly known as: CATAPRES TAKE 1 TABLET TWICE DAILY   ferrous sulfate 325 (65 FE) MG tablet Take 325 mg by mouth daily with breakfast.   furosemide 40 MG  tablet Commonly known as: LASIX Take 1 tablet (40 mg total) by mouth daily.   magnesium oxide 400 (241.3 Mg) MG tablet Commonly known as: MAG-OX Take 1 tablet (400 mg total) by mouth daily.   methimazole 10 MG tablet Commonly known as: TAPAZOLE Take 1 tablet (10 mg total) by mouth 3 (three) times daily.   NexIUM 40 MG capsule Generic drug: esomeprazole Take twice a day by mouth. What changed:   how much to take  how to take this  when to take this  additional instructions   propranolol 60 MG tablet Commonly known as: INDERAL Take 1 tablet (60 mg total) by mouth 2 (two) times daily.   Systane 0.4-0.3 % Gel ophthalmic gel Generic drug: Polyethyl Glycol-Propyl Glycol Place 1 application into both eyes every 4 (four) hours as needed.   Systane Nighttime Oint Apply 1 application to eye at bedtime.   traZODone 50 MG tablet Commonly known as: DESYREL   Trintellix 10 MG Tabs tablet Generic drug: vortioxetine HBr Take 10 mg by mouth daily.   valsartan 80 MG tablet Commonly known as: Diovan Take 1 tablet (80 mg total) by mouth daily.   zolpidem 12.5 MG CR tablet Commonly known as: AMBIEN CR TAKE 1 TABLET(12.5 MG) BY MOUTH AT BEDTIME AS NEEDED FOR SLEEP What changed: See the new instructions.       Follow-up Information    Melida Quitter, MD. Schedule an appointment as soon as possible for a visit in 1 week(s).   Specialty: Otolaryngology Contact  information: [address] [place] [address] [phone]               Signed: Melida Quitter 12/08/2019, 6:52 AM

## 2019-12-12 LAB — SURGICAL PATHOLOGY

## 2019-12-13 DIAGNOSIS — I509 Heart failure, unspecified: Secondary | ICD-10-CM | POA: Insufficient documentation

## 2019-12-13 DIAGNOSIS — I5022 Chronic systolic (congestive) heart failure: Secondary | ICD-10-CM | POA: Insufficient documentation

## 2019-12-13 DIAGNOSIS — T300 Burn of unspecified body region, unspecified degree: Secondary | ICD-10-CM | POA: Insufficient documentation

## 2019-12-14 ENCOUNTER — Other Ambulatory Visit: Payer: Medicare HMO

## 2019-12-14 ENCOUNTER — Other Ambulatory Visit: Payer: Self-pay

## 2019-12-14 DIAGNOSIS — E0590 Thyrotoxicosis, unspecified without thyrotoxic crisis or storm: Secondary | ICD-10-CM | POA: Diagnosis not present

## 2019-12-15 LAB — COMPREHENSIVE METABOLIC PANEL
ALT: 19 IU/L (ref 0–32)
AST: 29 IU/L (ref 0–40)
Albumin/Globulin Ratio: 1.1 — ABNORMAL LOW (ref 1.2–2.2)
Albumin: 4.2 g/dL (ref 3.8–4.8)
Alkaline Phosphatase: 249 IU/L — ABNORMAL HIGH (ref 44–121)
BUN/Creatinine Ratio: 23 (ref 12–28)
BUN: 13 mg/dL (ref 8–27)
Bilirubin Total: 0.7 mg/dL (ref 0.0–1.2)
CO2: 20 mmol/L (ref 20–29)
Calcium: 9.3 mg/dL (ref 8.7–10.3)
Chloride: 94 mmol/L — ABNORMAL LOW (ref 96–106)
Creatinine, Ser: 0.56 mg/dL — ABNORMAL LOW (ref 0.57–1.00)
GFR calc Af Amer: 116 mL/min/{1.73_m2} (ref >59)
GFR calc non Af Amer: 100 mL/min/{1.73_m2} (ref >59)
Globulin, Total: 3.9 g/dL (ref 1.5–4.5)
Glucose: 92 mg/dL (ref 65–99)
Potassium: 4.6 mmol/L (ref 3.5–5.2)
Sodium: 133 mmol/L — ABNORMAL LOW (ref 134–144)
Total Protein: 8.1 g/dL (ref 6.0–8.5)

## 2019-12-15 LAB — MAGNESIUM: Magnesium: 1.5 mg/dL — ABNORMAL LOW (ref 1.6–2.3)

## 2019-12-15 LAB — T4, FREE: Free T4: 1.38 ng/dL (ref 0.82–1.77)
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
## 2019-12-19 ENCOUNTER — Ambulatory Visit: Payer: Medicare HMO | Admitting: Cardiology

## 2019-12-19 ENCOUNTER — Other Ambulatory Visit: Payer: Self-pay

## 2019-12-19 ENCOUNTER — Encounter: Payer: Self-pay | Admitting: Cardiology

## 2019-12-19 VITALS — BP 124/70 | HR 58 | Ht 62.0 in | Wt 174.0 lb

## 2019-12-19 DIAGNOSIS — I110 Hypertensive heart disease with heart failure: Secondary | ICD-10-CM | POA: Diagnosis not present

## 2019-12-19 DIAGNOSIS — I5042 Chronic combined systolic (congestive) and diastolic (congestive) heart failure: Secondary | ICD-10-CM

## 2019-12-19 DIAGNOSIS — E0500 Thyrotoxicosis with diffuse goiter without thyrotoxic crisis or storm: Secondary | ICD-10-CM | POA: Diagnosis not present

## 2019-12-19 DIAGNOSIS — I340 Nonrheumatic mitral (valve) insufficiency: Secondary | ICD-10-CM | POA: Diagnosis not present

## 2019-12-19 DIAGNOSIS — I351 Nonrheumatic aortic (valve) insufficiency: Secondary | ICD-10-CM | POA: Diagnosis not present

## 2019-12-19 NOTE — Patient Instructions (Signed)
Medication Instructions:  Your physician recommends that you continue on your current medications as directed. Please refer to the Current Medication list given to you today.  *If you need a refill on your cardiac medications before your next appointment, please call your pharmacy*   Lab Work: NONE If you have labs (blood work) drawn today and your tests are completely normal, you will receive your results only by: Marland Kitchen MyChart Message (if you have MyChart) OR . A paper copy in the mail If you have any lab test that is abnormal or we need to change your treatment, we will call you to review the results.   Testing/Procedures: NONE   Follow-Up: At [facility], you and your health needs are our priority.  As part of our continuing mission to provide you with exceptional heart care, we have created designated Provider Care Teams.  These Care Teams include your primary Cardiologist (physician) and Advanced Practice Providers (APPs -  Physician Assistants and Nurse Practitioners) who all work together to provide you with the care you need, when you need it.  We recommend signing up for the patient portal called "MyChart".  Sign up information is provided on this After Visit Summary.  MyChart is used to connect with patients for Virtual Visits (Telemedicine).  Patients are able to view lab/test results, encounter notes, upcoming appointments, etc.  Non-urgent messages can be sent to your provider as well.   To learn more about what you can do with MyChart, go to [email or web address].    Your next appointment:   3 month(s)  The format for your next appointment:   In Person  Provider:   Shirlee More, MD   Other Instructions NONE

## 2019-12-21 ENCOUNTER — Ambulatory Visit: Payer: Medicare HMO | Admitting: Family Medicine

## 2019-12-22 ENCOUNTER — Other Ambulatory Visit: Payer: Self-pay

## 2019-12-22 ENCOUNTER — Ambulatory Visit: Payer: Medicare HMO

## 2019-12-22 DIAGNOSIS — I10 Essential (primary) hypertension: Secondary | ICD-10-CM

## 2019-12-22 DIAGNOSIS — E782 Mixed hyperlipidemia: Secondary | ICD-10-CM

## 2019-12-22 NOTE — Patient Instructions (Addendum)
Visit Information  Goals Addressed            This Visit's Progress   . Pharmacy Care Plan       CARE PLAN ENTRY (see longitudinal plan of care for additional care plan information)  Current Barriers:  . Chronic Disease Management support, education, and care coordination needs related to Hypertension, Hyperlipidemia, Depression, Hyperthyroidism and Anxiety   Hypertension/Heart Failure BP Readings from Last 3 Encounters:  12/19/19 124/70  12/08/19 (!) 145/72  12/05/19 (!) (P) 187/93   . Pharmacist Clinical Goal(s): o Over the next 90 days, patient will work with PharmD and providers to maintain BP goal <140/90 . Current regimen:  o Clonidine 0.1 mg twice daily o Valsartan 80 mg daily  o Propanolol 60 mg bid o Furosemide 20 mg daily  . Interventions: o Recommended patient continue taking medication as prescribed.  o Patient reports improved symptoms since surgery.  . Patient self care activities - Over the next 90 days, patient will: o Check BP weekly, document, and provide at future appointments o Ensure daily salt intake < 2300 mg/day  Hyperlipidemia Lab Results  Component Value Date/Time   LDLCALC 71 11/16/2019 02:30 PM   . Pharmacist Clinical Goal(s): o Over the next 90 days, patient will work with PharmD and providers to maintain LDL goal < 100  . Current regimen:  o Rosuvastatin 20 mg daily - patient reports not taking currently . Interventions: o Encouraged patient to resume water aerobics when tolerated/safe.  o Patient reports not taking rosuvastatin currently.  . Patient self care activities - Over the next 90 days, patient will: o Continue taking current medications.  o Reach out to provider or pharmacist with any questions/concerns.   Hypothyroid . Pharmacist Clinical Goal(s) o Over the next 90 days, patient will work with PharmD and providers to control  . Current regimen:  o Methimazole 10 mg bid o Atenolol 50 mg bid  . Interventions: o Discussed  upcoming thyroidectomy in November.  . Patient self care activities - Over the next 90 days, patient will: o Follow-up with endocrinologist as scheduled.  o Contact provider or pharmacist with any questions or concerns.   Medication management . Pharmacist Clinical Goal(s): o Over the next 90 days, patient will work with PharmD and providers to achieve optimal medication adherence . Current pharmacy: [facility] . Interventions o Comprehensive medication review performed. o Continue current medication management strategy . Patient self care activities - Over the next 90 days, patient will: o Focus on medication adherence by continuing to keep medications in same area and taking as prescribed.  o Take medications as prescribed o Report any questions or concerns to PharmD and/or provider(s)  Please see past updates related to this goal by clicking on the "Past Updates" button in the selected goal         The patient verbalized understanding of instructions, educational materials, and care plan provided today and declined offer to receive copy of patient instructions, educational materials, and care plan.   Telephone follow up appointment with pharmacy team member scheduled for: 06/2020  Burnice Logan, [facility]  Fat and Cholesterol Restricted Eating Plan Getting too much fat and cholesterol in your diet may cause health problems. Choosing the right foods helps keep your fat and cholesterol at normal levels. This can keep you from getting certain diseases. Your doctor may recommend an eating plan that includes:  Total fat: ______% or less of total calories a day.  Saturated fat:  ______% or less of total calories a day.  Cholesterol: less than _________mg a day.  Fiber: ______g a day. What are tips for following this plan? Meal planning  At meals, divide your plate into four equal parts: ? Fill one-half of your plate with vegetables and green salads. ? Fill one-fourth of your  plate with whole grains. ? Fill one-fourth of your plate with low-fat (lean) protein foods.  Eat fish that is high in omega-3 fats at least two times a week. This includes mackerel, tuna, sardines, and salmon.  Eat foods that are high in fiber, such as whole grains, beans, apples, broccoli, carrots, peas, and barley. General tips   Work with your doctor to lose weight if you need to.  Avoid: ? Foods with added sugar. ? Fried foods. ? Foods with partially hydrogenated oils.  Limit alcohol intake to no more than 1 drink a day for nonpregnant women and 2 drinks a day for men. One drink equals 12 oz of beer, 5 oz of wine, or 1 oz of hard liquor. Reading food labels  Check food labels for: ? Trans fats. ? Partially hydrogenated oils. ? Saturated fat (g) in each serving. ? Cholesterol (mg) in each serving. ? Fiber (g) in each serving.  Choose foods with healthy fats, such as: ? Monounsaturated fats. ? Polyunsaturated fats. ? Omega-3 fats.  Choose grain products that have whole grains. Look for the word "whole" as the first word in the ingredient list. Cooking  Cook foods using low-fat methods. These include baking, boiling, grilling, and broiling.  Eat more home-cooked foods. Eat at restaurants and buffets less often.  Avoid cooking using saturated fats, such as butter, cream, palm oil, palm kernel oil, and coconut oil. Recommended foods  Fruits  All fresh, canned (in natural juice), or frozen fruits. Vegetables  Fresh or frozen vegetables (raw, steamed, roasted, or grilled). Green salads. Grains  Whole grains, such as whole wheat or whole grain breads, crackers, cereals, and pasta. Unsweetened oatmeal, bulgur, barley, quinoa, or Roderick Calo rice. Corn or whole wheat flour tortillas. Meats and other protein foods  Ground beef (85% or leaner), grass-fed beef, or beef trimmed of fat. Skinless chicken or [place]. Ground chicken or [place]. Pork trimmed of fat. All fish and  seafood. Egg whites. Dried beans, peas, or lentils. Unsalted nuts or seeds. Unsalted canned beans. Nut butters without added sugar or oil. Dairy  Low-fat or nonfat dairy products, such as skim or 1% milk, 2% or reduced-fat cheeses, low-fat and fat-free ricotta or cottage cheese, or plain low-fat and nonfat yogurt. Fats and oils  Tub margarine without trans fats. Light or reduced-fat mayonnaise and salad dressings. Avocado. Olive, canola, sesame, or safflower oils. The items listed above may not be a complete list of foods and beverages you can eat. Contact a dietitian for more information. Foods to avoid Fruits  Canned fruit in heavy syrup. Fruit in cream or butter sauce. Fried fruit. Vegetables  Vegetables cooked in cheese, cream, or butter sauce. Fried vegetables. Grains  White bread. White pasta. White rice. Cornbread. Bagels, pastries, and croissants. Crackers and snack foods that contain trans fat and hydrogenated oils. Meats and other protein foods  Fatty cuts of meat. Ribs, chicken wings, bacon, sausage, bologna, salami, chitterlings, fatback, hot dogs, bratwurst, and packaged lunch meats. Liver and organ meats. Whole eggs and egg yolks. Chicken and [place] with skin. Fried meat. Dairy  Whole or 2% milk, cream, half-and-half, and cream cheese. Whole milk cheeses. Whole-fat or sweetened  yogurt. Full-fat cheeses. Nondairy creamers and whipped toppings. Processed cheese, cheese spreads, and cheese curds. Beverages  Alcohol. Sugar-sweetened drinks such as sodas, lemonade, and fruit drinks. Fats and oils  Butter, stick margarine, lard, shortening, ghee, or bacon fat. Coconut, palm kernel, and palm oils. Sweets and desserts  Corn syrup, sugars, honey, and molasses. Candy. Jam and jelly. Syrup. Sweetened cereals. Cookies, pies, cakes, donuts, muffins, and ice cream. The items listed above may not be a complete list of foods and beverages you should avoid. Contact a dietitian for more  information. Summary  Choosing the right foods helps keep your fat and cholesterol at normal levels. This can keep you from getting certain diseases.  At meals, fill one-half of your plate with vegetables and green salads.  Eat high-fiber foods, like whole grains, beans, apples, carrots, peas, and barley.  Limit added sugar, saturated fats, alcohol, and fried foods. This information is not intended to replace advice given to you by your health care provider. Make sure you discuss any questions you have with your health care provider. Document Revised: 09/02/2017 Document Reviewed: 09/16/2016 Elsevier Patient Education  [place].

## 2019-12-22 NOTE — Chronic Care Management (AMB) (Signed)
Chronic Care Management Pharmacy  Name: Donna Mayer  MRN: [id number] DOB: 01-08-1956  Chief Complaint/ HPI  Donna Mayer,  64 y.o. , female presents for their Follow-Up CCM visit with the clinical pharmacist via telephone due to COVID-19 Pandemic.  PCP : Donna Brome, MD  Their chronic conditions include: hypertension, GERD, hyperthyroidism, graves disease, anxiety, hyperlipidemia, depression.   Plan Recommendations:   Patient reports that she is not currently taking cholesterol medications. Patient states if she is supposed to be taking that she needs prescription sent to [facility] mail order. Please advise.   Patient needs refill of Trintellix 10 mg sent to mail order (requested from nursing).   Office Visits:   11/30/2019 - keep appointment with Dr. Gilford Mayer about proptosis. Use systane gel for daytime and ointment for nighttime dryness.   11/16/2019 - Pro BNP elevated. Troponin elevated - sent to ED. Increase valsartan 80 mg daily. Change trintellix 10 mg in am.   Consult Visit:   12/21/2019 - surgical site healing well. Follow-up with endocrinology.    12/19/2019 - cardiology - continue loop diuretic, beta blocker, arb and recheck in January. Continue current antihypertensive.   12/07/2019 - surgery - thyroidectomy.   12/02/2019 - endocrinology - continue methimazole 10 mg tid until Wednesday 11/24.   11/16/2019 - hospitalization for heart failure.   Medications: Outpatient Encounter Medications as of 12/22/2019  Medication Sig  . cloNIDine (CATAPRES) 0.1 MG tablet TAKE 1 TABLET TWICE DAILY (Patient taking differently: Take 0.1 mg by mouth 2 (two) times daily.)  . ferrous sulfate 325 (65 FE) MG tablet Take 325 mg by mouth daily with breakfast.  . furosemide (LASIX) 40 MG tablet Take 1 tablet (40 mg total) by mouth daily. (Patient taking differently: Take 20 mg by mouth daily.)  . levothyroxine (SYNTHROID) 125 MCG tablet Take 125 mcg by mouth daily.  . magnesium  oxide (MAG-OX) 400 (241.3 Mg) MG tablet Take 1 tablet (400 mg total) by mouth daily.  Marland Kitchen NEXIUM 40 MG capsule Take twice a day by mouth. (Patient taking differently: Take 40 mg by mouth daily at 12 noon.)  . Polyethyl Glycol-Propyl Glycol (SYSTANE) 0.4-0.3 % GEL ophthalmic gel Place 1 application into both eyes every 4 (four) hours as needed.  . propranolol (INDERAL) 60 MG tablet Take 1 tablet (60 mg total) by mouth 2 (two) times daily.  . valsartan (DIOVAN) 80 MG tablet Take 1 tablet (80 mg total) by mouth daily.  . Vitamin D, Ergocalciferol, (DRISDOL) 1.25 MG (50000 UNIT) CAPS capsule Take 50,000 Units by mouth once a week.  . vortioxetine HBr (TRINTELLIX) 10 MG TABS tablet Take 10 mg by mouth daily.  Dema Severin Petrolatum-Mineral Oil (SYSTANE NIGHTTIME) OINT Apply 1 application to eye at bedtime.  Marland Kitchen zolpidem (AMBIEN CR) 12.5 MG CR tablet TAKE 1 TABLET(12.5 MG) BY MOUTH AT BEDTIME AS NEEDED FOR SLEEP (Patient taking differently: Take 12.5 mg by mouth at bedtime as needed for sleep.)   No facility-administered encounter medications on file as of 12/22/2019.   No Known Allergies  SDOH Screenings   Alcohol Screen: Not on file  Depression (PHQ2-9): Medium Risk  . PHQ-2 Score: 9  Financial Resource Strain: Not on file  Food Insecurity: No Food Insecurity  . Worried About Charity fundraiser in the Last Year: Never true  . Ran Out of Food in the Last Year: Never true  Housing: Low Risk   . Last Housing Risk Score: 0  Physical Activity: Not on file  Social Connections: Not on file  Stress: Not on file  Tobacco Use: Low Risk   . Smoking Tobacco Use: Never Smoker  . Smokeless Tobacco Use: Never Used  Transportation Needs: No Transportation Needs  . Lack of Transportation (Medical): No  . Lack of Transportation (Non-Medical): No    Current Diagnosis/Assessment:  Goals Addressed            This Visit's Progress   . Pharmacy Care Plan       CARE PLAN ENTRY (see longitudinal plan of  care for additional care plan information)  Current Barriers:  . Chronic Disease Management support, education, and care coordination needs related to Hypertension, Hyperlipidemia, Depression, Hyperthyroidism and Anxiety   Hypertension/Heart Failure BP Readings from Last 3 Encounters:  12/19/19 124/70  12/08/19 (!) 145/72  12/05/19 (!) (P) 187/93   . Pharmacist Clinical Goal(s): o Over the next 90 days, patient will work with PharmD and providers to maintain BP goal <140/90 . Current regimen:  o Clonidine 0.1 mg twice daily o Valsartan 80 mg daily  o Propanolol 60 mg bid o Furosemide 20 mg daily  . Interventions: o Recommended patient continue taking medication as prescribed.  o Patient reports improved symptoms since surgery.  . Patient self care activities - Over the next 90 days, patient will: o Check BP weekly, document, and provide at future appointments o Ensure daily salt intake < 2300 mg/day  Hyperlipidemia Lab Results  Component Value Date/Time   LDLCALC 71 11/16/2019 02:30 PM   . Pharmacist Clinical Goal(s): o Over the next 90 days, patient will work with PharmD and providers to maintain LDL goal < 100  . Current regimen:  o Rosuvastatin 20 mg daily - patient reports not taking currently . Interventions: o Encouraged patient to resume water aerobics when tolerated/safe.  o Patient reports not taking rosuvastatin currently.  . Patient self care activities - Over the next 90 days, patient will: o Continue taking current medications.  o Reach out to provider or pharmacist with any questions/concerns.   Hypothyroid . Pharmacist Clinical Goal(s) o Over the next 90 days, patient will work with PharmD and providers to control  . Current regimen:  o Methimazole 10 mg bid o Atenolol 50 mg bid  . Interventions: o Discussed upcoming thyroidectomy in November.  . Patient self care activities - Over the next 90 days, patient will: o Follow-up with endocrinologist as  scheduled.  o Contact provider or pharmacist with any questions or concerns.   Medication management . Pharmacist Clinical Goal(s): o Over the next 90 days, patient will work with PharmD and providers to achieve optimal medication adherence . Current pharmacy: [facility] . Interventions o Comprehensive medication review performed. o Continue current medication management strategy . Patient self care activities - Over the next 90 days, patient will: o Focus on medication adherence by continuing to keep medications in same area and taking as prescribed.  o Take medications as prescribed o Report any questions or concerns to PharmD and/or provider(s)  Please see past updates related to this goal by clicking on the "Past Updates" button in the selected goal         Hypertension/Heart Failure   Office blood pressures are  BP Readings from Last 3 Encounters:  12/19/19 124/70  12/08/19 (!) 145/72  12/05/19 (!) (P) 187/93    Patient has failed these meds in the past: irbesartan-hctz Patient is currently uncontrolled on the following medications:   Clonidine 0.1 mg twice daily  Valsartan 80 mg daily  Propanolol 60 mg bid  Furosemide 20 mg daily   Patient checks BP at home infrequently  Patient home BP readings are ranging: 120/70 mmHg   We discussed diet and exercise extensively. Dr. Bettina Gavia ultimately hopes to titrate off clonidine but holding off for now.Patient reports good blood pressure control. Denies racing heart since thyroidectomy.   Patient reports furosemide has been decreased to 20 mg daily.   Plan  Continue current medications   Hyperlipidemia   LDL goal < 100  Lipid Panel     Component Value Date/Time   CHOL 114 11/16/2019 1430   TRIG 62 11/16/2019 1430   HDL 29 (L) 11/16/2019 1430   LDLCALC 71 11/16/2019 1430    Hepatic Function Latest Ref Rng & Units 12/14/2019 11/30/2019 11/16/2019  Total Protein 6.0 - 8.5 g/dL 8.1 7.8 6.9  Albumin 3.8 -  4.8 g/dL 4.2 3.7(L) 3.3(L)  AST 0 - 40 IU/L 29 25 23   ALT 0 - 32 IU/L 19 16 9   Alk Phosphatase 44 - 121 IU/L 249(H) 246(H) 185(H)  Total Bilirubin 0.0 - 1.2 mg/dL 0.7 0.8 1.8(H)     The ASCVD Risk score Mikey Bussing DC Jr., et al., 2013) failed to calculate for the following reasons:   The valid total cholesterol range is 130 to 320 mg/dL   Patient has failed these meds in past: none reported Patient is currently controlled on the following medications:  . Rosuvastatin 20 mg daily - not currently taking  We discussed:  diet and exercise extensively. Patient reports that she is not taking Crestor right now and isn't sure she is supposed to be currently. Pharmacist will verify with Dr. Tobie Poet. Patient reports [facility] to [facility].   Plan  Continue current medications  Hypothyroidism post thyroidectomy   Lab Results  Component Value Date/Time   TSH <0.010 (L) 11/16/2019 10:05 PM   TSH <0.005 (L) 08/02/2019 04:11 PM   TSH <0.005 (L) 06/10/2019 08:00 AM   FREET4 1.38 12/14/2019 08:35 AM   FREET4 2.00 (H) 11/16/2019 10:05 PM    Patient has failed these meds in past: levothyroxine for thyroid in the past Patient is currently controlled on the following medications:  . Levothyroxine 125 mcg daily  We discussed: Patient reports feeling much better and doing well on levothyroxine 125 mcg currently. Symptoms are much improved on current therapy.   Plan  Continue current medications   GERD   Patient has failed these meds in past: pantoprazole Patient is currently controlled on the following medications:  . Nexium OTC daily   We discussed:  Patient reports taking Nexium OTC once daily. Symptoms well controlled.   Plan  Continue current medications   Depression/Anxiety   Patient has failed these meds in past: none reported Patient is currently controlled on the following medications:  . Trintellix 10 mg daily   We discussed: Patient is tolerating 10 mg daily. States she  needs a refill sent from [facility]. Patient is not currently approved for [facility] [employer] [facility]. Pharmacist can help with approval for upcoming year. Patient reports that medication is free through [facility] currently.   Plan  Continue current medications   Insomnia   Patient has failed these meds in past: n/a Patient is currently controlled on the following medications:  . Trazodone 50 mg - 1/2-1 tablet at bedtime prn  . Zolpidem CR 12.5 mg at bedtime prn   We discussed:  Patient reports that sleep is improved since  surgery.   Plan  Continue current medications   Health Maintenance   CBC    Component Value Date/Time   WBC 4.9 11/30/2019 1014   WBC 7.0 11/20/2019 0414   RBC 4.51 11/30/2019 1014   RBC 4.04 11/20/2019 0414   HGB 10.6 (L) 11/30/2019 1014   HCT 35.1 11/30/2019 1014   PLT 299 11/30/2019 1014   MCV 78 (L) 11/30/2019 1014   MCH 23.5 (L) 11/30/2019 1014   MCH 23.8 (L) 11/20/2019 0414   MCHC 30.2 (L) 11/30/2019 1014   MCHC 31.2 11/20/2019 0414   RDW 16.7 (H) 11/30/2019 1014   LYMPHSABS 1.2 11/30/2019 1014   MONOABS 0.6 11/16/2019 1956   EOSABS 0.1 11/30/2019 1014   BASOSABS 0.0 11/30/2019 1014     Patient is currently controlled on the following medications:  . Ferrous sulfate 325 mg daily - anemia . Fexofenadine OTC daily allergies . Magnesium 400 mg daily - low magnesium   We discussed:  Patient denies concerns at this time.   Plan  Continue current medications  Vaccines   Reviewed and discussed patient's vaccination history. Shingles vaccine script given but patient has not received it. Discussed benefits of vaccine to protect from shingles.   Immunization History  Administered Date(s) Administered  . Influenza Inj Mdck Quad Pf 11/10/2019  . Moderna SARS-COVID-2 Vaccination 03/17/2019, 04/15/2019  . Pneumococcal Polysaccharide-23 10/25/2014    Plan  Recommended patient receive third COVID vaccine in office.  Medication Management   Pt  uses [facility] for all medications Uses pill box? No - lines up on night stand. She eventually will use a pill box.  Pt endorses good compliance and denies missed doses.   We discussed: Patient has been a patient sitter in the past and understands the importance of taking her medication. She lines it all up on her night stand.   Plan  Continue current medication management strategy    Follow up: 6 month phone visit

## 2019-12-23 ENCOUNTER — Other Ambulatory Visit: Payer: Self-pay

## 2019-12-23 MED ORDER — VORTIOXETINE HBR 10 MG PO TABS
10.0000 mg | ORAL_TABLET | Freq: Every day | ORAL | 1 refills | Status: DC
Start: 2019-12-23 — End: 2019-12-26

## 2019-12-26 ENCOUNTER — Other Ambulatory Visit: Payer: Self-pay | Admitting: Family Medicine

## 2019-12-26 MED ORDER — VORTIOXETINE HBR 10 MG PO TABS
10.0000 mg | ORAL_TABLET | Freq: Every day | ORAL | 1 refills | Status: DC
Start: 2019-12-26 — End: 2020-07-13

## 2020-01-09 ENCOUNTER — Ambulatory Visit: Payer: Medicare HMO | Admitting: Family Medicine

## 2020-01-16 DIAGNOSIS — E890 Postprocedural hypothyroidism: Secondary | ICD-10-CM

## 2020-01-16 HISTORY — DX: Postprocedural hypothyroidism: E89.0

## 2020-01-30 NOTE — Progress Notes (Signed)
Cancelled.  

## 2020-01-31 ENCOUNTER — Ambulatory Visit (INDEPENDENT_AMBULATORY_CARE_PROVIDER_SITE_OTHER): Payer: Medicare HMO | Admitting: Family Medicine

## 2020-01-31 DIAGNOSIS — E0590 Thyrotoxicosis, unspecified without thyrotoxic crisis or storm: Secondary | ICD-10-CM

## 2020-02-16 ENCOUNTER — Other Ambulatory Visit: Payer: Medicare HMO

## 2020-02-16 ENCOUNTER — Other Ambulatory Visit: Payer: Self-pay

## 2020-02-16 ENCOUNTER — Other Ambulatory Visit: Payer: Self-pay | Admitting: Family Medicine

## 2020-02-16 DIAGNOSIS — E0590 Thyrotoxicosis, unspecified without thyrotoxic crisis or storm: Secondary | ICD-10-CM

## 2020-02-16 DIAGNOSIS — I10 Essential (primary) hypertension: Secondary | ICD-10-CM

## 2020-02-17 ENCOUNTER — Other Ambulatory Visit: Payer: Self-pay | Admitting: Family Medicine

## 2020-02-17 LAB — CBC WITH DIFFERENTIAL/PLATELET
Basophils Absolute: 0 10*3/uL (ref 0.0–0.2)
Basos: 0 %
EOS (ABSOLUTE): 0.2 10*3/uL (ref 0.0–0.4)
Eos: 3 %
Hematocrit: 33.5 % — ABNORMAL LOW (ref 34.0–46.6)
Hemoglobin: 10.9 g/dL — ABNORMAL LOW (ref 11.1–15.9)
Immature Grans (Abs): 0 10*3/uL (ref 0.0–0.1)
Immature Granulocytes: 0 %
Lymphocytes Absolute: 2.1 10*3/uL (ref 0.7–3.1)
Lymphs: 30 %
MCH: 25.6 pg — ABNORMAL LOW (ref 26.6–33.0)
MCHC: 32.5 g/dL (ref 31.5–35.7)
MCV: 79 fL (ref 79–97)
Monocytes Absolute: 0.6 10*3/uL (ref 0.1–0.9)
Monocytes: 9 %
Neutrophils Absolute: 4 10*3/uL (ref 1.4–7.0)
Neutrophils: 58 %
Platelets: 252 10*3/uL (ref 150–450)
RBC: 4.26 x10E6/uL (ref 3.77–5.28)
RDW: 17.6 % — ABNORMAL HIGH (ref 11.7–15.4)
WBC: 7 10*3/uL (ref 3.4–10.8)

## 2020-02-17 LAB — COMPREHENSIVE METABOLIC PANEL
ALT: 14 IU/L (ref 0–32)
AST: 24 IU/L (ref 0–40)
Albumin/Globulin Ratio: 1.3 (ref 1.2–2.2)
Albumin: 4 g/dL (ref 3.8–4.8)
Alkaline Phosphatase: 163 IU/L — ABNORMAL HIGH (ref 44–121)
BUN/Creatinine Ratio: 33 — ABNORMAL HIGH (ref 12–28)
BUN: 21 mg/dL (ref 8–27)
Bilirubin Total: 0.3 mg/dL (ref 0.0–1.2)
CO2: 26 mmol/L (ref 20–29)
Calcium: 9.3 mg/dL (ref 8.7–10.3)
Chloride: 99 mmol/L (ref 96–106)
Creatinine, Ser: 0.64 mg/dL (ref 0.57–1.00)
GFR calc Af Amer: 111 mL/min/{1.73_m2} (ref >59)
GFR calc non Af Amer: 96 mL/min/{1.73_m2} (ref >59)
Globulin, Total: 3.2 g/dL (ref 1.5–4.5)
Glucose: 102 mg/dL — ABNORMAL HIGH (ref 65–99)
Potassium: 3.9 mmol/L (ref 3.5–5.2)
Sodium: 139 mmol/L (ref 134–144)
Total Protein: 7.2 g/dL (ref 6.0–8.5)

## 2020-02-17 LAB — MAGNESIUM: Magnesium: 1.6 mg/dL (ref 1.6–2.3)

## 2020-02-17 LAB — TSH: TSH: <0.005 u[IU]/mL — ABNORMAL LOW (ref 0.450–4.500)

## 2020-02-17 LAB — T4, FREE: Free T4: 2.25 ng/dL — ABNORMAL HIGH (ref 0.82–1.77)

## 2020-02-19 ENCOUNTER — Other Ambulatory Visit: Payer: Self-pay | Admitting: Family Medicine

## 2020-02-19 DIAGNOSIS — I10 Essential (primary) hypertension: Secondary | ICD-10-CM

## 2020-02-19 MED ORDER — VALSARTAN 80 MG PO TABS: 80.0000 mg | ORAL_TABLET | Freq: Every day | ORAL | 0 refills | Status: DC

## 2020-02-20 ENCOUNTER — Other Ambulatory Visit: Payer: Self-pay | Admitting: Family Medicine

## 2020-02-20 DIAGNOSIS — I10 Essential (primary) hypertension: Secondary | ICD-10-CM

## 2020-02-22 ENCOUNTER — Encounter: Payer: Self-pay | Admitting: Family Medicine

## 2020-02-22 ENCOUNTER — Telehealth: Payer: Self-pay

## 2020-02-22 ENCOUNTER — Other Ambulatory Visit: Payer: Self-pay | Admitting: Family Medicine

## 2020-02-22 MED ORDER — LEVOTHYROXINE SODIUM 112 MCG PO TABS: 112.0000 ug | ORAL_TABLET | Freq: Every day | ORAL | 1 refills | Status: DC

## 2020-02-22 NOTE — Telephone Encounter (Signed)
We spoke with patient and sent in synthroid 112 mcg once daily in am. Pt to call back in am to schedule a follow up appt fasting in 6 weeks. Kc

## 2020-02-22 NOTE — Telephone Encounter (Signed)
Patient left VM stating insurance is waiting on PA for Trintellix. Donna Mayer states she has been approved for patient assistance for this medication. Pt also stated that she can not get in touch with her endocrinology about her thyroid levels. Please advise.

## 2020-02-22 NOTE — Telephone Encounter (Signed)
After speaking with Claudine Mouton. Claudine Mouton also called [facility] and they did not know why medicine had not been shipped. They are expediting Trintellix to pt by the end of this week. Called pt and made her aware. Pt also stated she is out of her levothyroxine as of today. She cannot get a hold of endocrinology and she needs the medication because her "thyroid is abnormal."

## 2020-02-23 ENCOUNTER — Telehealth: Payer: Self-pay

## 2020-02-23 NOTE — Telephone Encounter (Signed)
PA for Trintellix submitted and approved via covermymeds.

## 2020-03-01 ENCOUNTER — Telehealth: Payer: Self-pay
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
## 2020-03-05 DIAGNOSIS — E890 Postprocedural hypothyroidism: Secondary | ICD-10-CM | POA: Diagnosis not present

## 2020-03-05 DIAGNOSIS — R7989 Other specified abnormal findings of blood chemistry: Secondary | ICD-10-CM | POA: Diagnosis not present

## 2020-03-07 ENCOUNTER — Other Ambulatory Visit: Payer: Self-pay | Admitting: Family Medicine

## 2020-03-07 ENCOUNTER — Ambulatory Visit: Payer: Medicare HMO

## 2020-03-07 ENCOUNTER — Other Ambulatory Visit: Payer: Self-pay

## 2020-03-07 DIAGNOSIS — D508 Other iron deficiency anemias: Secondary | ICD-10-CM

## 2020-03-07 DIAGNOSIS — E0590 Thyrotoxicosis, unspecified without thyrotoxic crisis or storm: Secondary | ICD-10-CM

## 2020-03-07 DIAGNOSIS — I10 Essential (primary) hypertension: Secondary | ICD-10-CM

## 2020-03-08 LAB — CBC WITH DIFFERENTIAL/PLATELET
Basophils Absolute: 0 10*3/uL (ref 0.0–0.2)
Basos: 1 %
EOS (ABSOLUTE): 0.2 10*3/uL (ref 0.0–0.4)
Eos: 3 %
Hematocrit: 35 % (ref 34.0–46.6)
Hemoglobin: 11.3 g/dL (ref 11.1–15.9)
Immature Grans (Abs): 0 10*3/uL (ref 0.0–0.1)
Immature Granulocytes: 0 %
Lymphocytes Absolute: 2 10*3/uL (ref 0.7–3.1)
Lymphs: 32 %
MCH: 25.6 pg — ABNORMAL LOW (ref 26.6–33.0)
MCHC: 32.3 g/dL (ref 31.5–35.7)
MCV: 79 fL (ref 79–97)
Monocytes Absolute: 0.6 10*3/uL (ref 0.1–0.9)
Monocytes: 10 %
Neutrophils Absolute: 3.3 10*3/uL (ref 1.4–7.0)
Neutrophils: 54 %
Platelets: 237 10*3/uL (ref 150–450)
RBC: 4.42 x10E6/uL (ref 3.77–5.28)
RDW: 16.5 % — ABNORMAL HIGH (ref 11.7–15.4)
WBC: 6.2 10*3/uL (ref 3.4–10.8)

## 2020-03-08 LAB — TSH: TSH: 0.009 u[IU]/mL — ABNORMAL LOW (ref 0.450–4.500)

## 2020-03-08 LAB — T4, FREE: Free T4: 1.61 ng/dL (ref 0.82–1.77)

## 2020-03-21 ENCOUNTER — Ambulatory Visit: Payer: Medicare HMO | Admitting: Cardiology
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
## 2020-03-25 ENCOUNTER — Other Ambulatory Visit: Payer: Self-pay | Admitting: Family Medicine

## 2020-03-26 ENCOUNTER — Other Ambulatory Visit: Payer: Self-pay | Admitting: Family Medicine

## 2020-03-29 ENCOUNTER — Other Ambulatory Visit: Payer: Self-pay | Admitting: Family Medicine
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
## 2020-04-05 ENCOUNTER — Other Ambulatory Visit: Payer: Self-pay

## 2020-04-05 ENCOUNTER — Ambulatory Visit (INDEPENDENT_AMBULATORY_CARE_PROVIDER_SITE_OTHER): Payer: Medicare HMO | Admitting: Family Medicine

## 2020-04-05 VITALS — BP 180/84 | HR 100 | Temp 97.4°F | Resp 18 | Ht 62.0 in | Wt 222.0 lb

## 2020-04-05 DIAGNOSIS — K219 Gastro-esophageal reflux disease without esophagitis: Secondary | ICD-10-CM

## 2020-04-05 DIAGNOSIS — E782 Mixed hyperlipidemia: Secondary | ICD-10-CM | POA: Diagnosis not present

## 2020-04-05 DIAGNOSIS — F330 Major depressive disorder, recurrent, mild: Secondary | ICD-10-CM

## 2020-04-05 DIAGNOSIS — R7303 Prediabetes: Secondary | ICD-10-CM | POA: Diagnosis not present

## 2020-04-05 DIAGNOSIS — I10 Essential (primary) hypertension: Secondary | ICD-10-CM | POA: Diagnosis not present

## 2020-04-05 DIAGNOSIS — E559 Vitamin D deficiency, unspecified: Secondary | ICD-10-CM | POA: Diagnosis not present

## 2020-04-05 DIAGNOSIS — E0590 Thyrotoxicosis, unspecified without thyrotoxic crisis or storm: Secondary | ICD-10-CM

## 2020-04-05 DIAGNOSIS — Z1231 Encounter for screening mammogram for malignant neoplasm of breast: Secondary | ICD-10-CM | POA: Diagnosis not present

## 2020-04-05 DIAGNOSIS — D508 Other iron deficiency anemias: Secondary | ICD-10-CM | POA: Diagnosis not present

## 2020-04-05 DIAGNOSIS — E890 Postprocedural hypothyroidism: Secondary | ICD-10-CM | POA: Diagnosis not present

## 2020-04-05 MED ORDER — VITAMIN D (ERGOCALCIFEROL) 1.25 MG (50000 UNIT) PO CAPS: 50000.0000 [IU] | ORAL_CAPSULE | ORAL | 0 refills | Status: DC

## 2020-04-05 MED ORDER — ZOLPIDEM TARTRATE ER 12.5 MG PO TBCR: EXTENDED_RELEASE_TABLET | ORAL | 3 refills | Status: DC

## 2020-04-05 MED ORDER — VALSARTAN 160 MG PO TABS: 160.0000 mg | ORAL_TABLET | Freq: Every day | ORAL | 0 refills | Status: DC

## 2020-04-05 NOTE — Patient Instructions (Addendum)
INCREASE VALSARTAN 160 MG ONCE DAILY. (YOU CAN TAKE 80 MG 2 DAILY.)

## 2020-04-06 ENCOUNTER — Other Ambulatory Visit: Payer: Medicare HMO

## 2020-04-06 DIAGNOSIS — R7303 Prediabetes: Secondary | ICD-10-CM | POA: Diagnosis not present

## 2020-04-06 DIAGNOSIS — I10 Essential (primary) hypertension: Secondary | ICD-10-CM

## 2020-04-06 DIAGNOSIS — E890 Postprocedural hypothyroidism: Secondary | ICD-10-CM

## 2020-04-06 DIAGNOSIS — E782 Mixed hyperlipidemia: Secondary | ICD-10-CM | POA: Diagnosis not present

## 2020-04-06 DIAGNOSIS — E559 Vitamin D deficiency, unspecified: Secondary | ICD-10-CM

## 2020-04-07 LAB — COMPREHENSIVE METABOLIC PANEL
ALT: 16 IU/L (ref 0–32)
AST: 25 IU/L (ref 0–40)
Albumin/Globulin Ratio: 1.3 (ref 1.2–2.2)
Albumin: 4.6 g/dL (ref 3.8–4.8)
Alkaline Phosphatase: 146 IU/L — ABNORMAL HIGH (ref 44–121)
BUN/Creatinine Ratio: 18 (ref 12–28)
BUN: 12 mg/dL (ref 8–27)
Bilirubin Total: 0.4 mg/dL (ref 0.0–1.2)
CO2: 23 mmol/L (ref 20–29)
Calcium: 9.6 mg/dL (ref 8.7–10.3)
Chloride: 98 mmol/L (ref 96–106)
Creatinine, Ser: 0.66 mg/dL (ref 0.57–1.00)
Globulin, Total: 3.5 g/dL (ref 1.5–4.5)
Glucose: 91 mg/dL (ref 65–99)
Potassium: 4.4 mmol/L (ref 3.5–5.2)
Sodium: 138 mmol/L (ref 134–144)
Total Protein: 8.1 g/dL (ref 6.0–8.5)
eGFR: 99 mL/min/{1.73_m2} (ref >59)

## 2020-04-07 LAB — LIPID PANEL
Chol/HDL Ratio: 2.2 ratio (ref 0.0–4.4)
Cholesterol, Total: 268 mg/dL — ABNORMAL HIGH (ref 100–199)
HDL: 120 mg/dL (ref >39)
LDL Chol Calc (NIH): 133 mg/dL — ABNORMAL HIGH (ref 0–99)
Triglycerides: 93 mg/dL (ref 0–149)
VLDL Cholesterol Cal: 15 mg/dL (ref 5–40)

## 2020-04-07 LAB — CBC WITH DIFFERENTIAL/PLATELET
Basophils Absolute: 0 10*3/uL (ref 0.0–0.2)
Basos: 1 %
EOS (ABSOLUTE): 0.2 10*3/uL (ref 0.0–0.4)
Eos: 3 %
Hematocrit: 38.2 % (ref 34.0–46.6)
Hemoglobin: 12 g/dL (ref 11.1–15.9)
Immature Grans (Abs): 0 10*3/uL (ref 0.0–0.1)
Immature Granulocytes: 0 %
Lymphocytes Absolute: 1.9 10*3/uL (ref 0.7–3.1)
Lymphs: 29 %
MCH: 25.7 pg — ABNORMAL LOW (ref 26.6–33.0)
MCHC: 31.4 g/dL — ABNORMAL LOW (ref 31.5–35.7)
MCV: 82 fL (ref 79–97)
Monocytes Absolute: 0.5 10*3/uL (ref 0.1–0.9)
Monocytes: 8 %
Neutrophils Absolute: 3.9 10*3/uL (ref 1.4–7.0)
Neutrophils: 59 %
Platelets: 227 10*3/uL (ref 150–450)
RBC: 4.67 x10E6/uL (ref 3.77–5.28)
RDW: 14.6 % (ref 11.7–15.4)
WBC: 6.5 10*3/uL (ref 3.4–10.8)

## 2020-04-07 LAB — TSH: TSH: 0.187 u[IU]/mL — ABNORMAL LOW (ref 0.450–4.500)

## 2020-04-07 LAB — CARDIOVASCULAR RISK ASSESSMENT

## 2020-04-07 LAB — VITAMIN D 25 HYDROXY (VIT D DEFICIENCY, FRACTURES): Vit D, 25-Hydroxy: 17.7 ng/mL — ABNORMAL LOW (ref 30.0–100.0)

## 2020-04-07 LAB — HEMOGLOBIN A1C
Est. average glucose Bld gHb Est-mCnc: 105 mg/dL
Hgb A1c MFr Bld: 5.3 % (ref 4.8–5.6)

## 2020-04-10 ENCOUNTER — Encounter: Payer: Self-pay | Admitting: Family Medicine

## 2020-04-10 ENCOUNTER — Other Ambulatory Visit: Payer: Self-pay

## 2020-04-10 ENCOUNTER — Other Ambulatory Visit: Payer: Self-pay | Admitting: Family Medicine

## 2020-04-10 DIAGNOSIS — E559 Vitamin D deficiency, unspecified: Secondary | ICD-10-CM

## 2020-04-10 DIAGNOSIS — E0590 Thyrotoxicosis, unspecified without thyrotoxic crisis or storm: Secondary | ICD-10-CM

## 2020-04-10 MED ORDER — LEVOTHYROXINE SODIUM 100 MCG PO TABS: 100.0000 ug | ORAL_TABLET | Freq: Every day | ORAL | 1 refills | Status: DC

## 2020-04-10 MED ORDER — ATORVASTATIN CALCIUM 20 MG PO TABS: 20.0000 mg | ORAL_TABLET | Freq: Every day | ORAL | 1 refills | Status: DC

## 2020-04-10 MED ORDER — VITAMIN D (ERGOCALCIFEROL) 1.25 MG (50000 UNIT) PO CAPS: 50000.0000 [IU] | ORAL_CAPSULE | ORAL | 2 refills | Status: DC

## 2020-04-17 ENCOUNTER — Telehealth: Payer: Self-pay

## 2020-04-17 NOTE — Telephone Encounter (Signed)
Mammogram scheduled for 05/07/20 at 9:30 on [facility]. Patient is aware.

## 2020-05-07 ENCOUNTER — Ambulatory Visit: Payer: Medicare HMO

## 2020-05-16 ENCOUNTER — Telehealth: Payer: Self-pay

## 2020-05-16 NOTE — Progress Notes (Signed)
    Chronic Care Management Pharmacy Assistant   Name: Donna Mayer  MRN: [id number] DOB: Feb 20, 1955   Reason for Encounter: Disease State for general adherence  Recent office visits:  04/17/20-Mammogram scheduled for 05/07/20  04/05/20-Dr. [facility] PCP, Valsartan 129m, Stopped Propranolol, start back Lipitor 218m follow up 51m60molood count normal. Liver function normal. Kidney function normal. Synthroid level is getting closer to normal. Recommend decrease synthroid to 100 mcg daily in am. 30/1. Recheck tsh in 6 weeks.  Cholesterol: LDL 133. Worsened since 4 months ago. Recommend start on lipitor 20 mg once daily.  HBA1C: 5.3. Vitamin D 17. Recommend vitamin D 50000 U three times a week.   03/05/20-Endocrine, Post surgical hypothyroid  Recent consult visits:  none  Hospital visits:  None in previous 6 months  Medications: Outpatient Encounter Medications as of 05/16/2020  Medication Sig  . atorvastatin (LIPITOR) 20 MG tablet Take 1 tablet (20 mg total) by mouth daily.  . cloNIDine (CATAPRES) 0.1 MG tablet TAKE 1 TABLET TWICE DAILY (Patient taking differently: Take 0.1 mg by mouth 2 (two) times daily.)  . ferrous sulfate 325 (65 FE) MG tablet Take 325 mg by mouth daily with breakfast.  . furosemide (LASIX) 40 MG tablet Take 1 tablet (40 mg total) by mouth daily. (Patient taking differently: Take 20 mg by mouth daily.)  . levothyroxine (SYNTHROID) 100 MCG tablet TAKE 1 TABLET(100 MCG) BY MOUTH DAILY  . magnesium oxide (MAG-OX) 400 MG tablet Take by mouth.  . NMarland KitchenXIUM 40 MG capsule Take twice a day by mouth. (Patient taking differently: Take 40 mg by mouth daily at 12 noon.)  . Polyethyl Glycol-Propyl Glycol (SYSTANE) 0.4-0.3 % GEL ophthalmic gel Place 1 application into both eyes every 4 (four) hours as needed.  . valsartan (DIOVAN) 160 MG tablet Take 1 tablet (160 mg total) by mouth daily.  . Vitamin D, Ergocalciferol, (DRISDOL) 1.25 MG (50000 UNIT) CAPS capsule Take 1 capsule (50,000 Units  total) by mouth 3 (three) times a week.  . vortioxetine HBr (TRINTELLIX) 10 MG TABS tablet Take 1 tablet (10 mg total) by mouth daily.  . WDema Severintrolatum-Mineral Oil (SYSTANE NIGHTTIME) OINT Apply 1 application to eye at bedtime.  . zMarland Kitchenlpidem (AMBIEN CR) 12.5 MG CR tablet TAKE 1 TABLET(12.5 MG) BY MOUTH AT BEDTIME AS NEEDED FOR SLEEP   No facility-administered encounter medications on file as of 05/16/2020.   05/16/20-Called and left patient a message to call me back.  05/21/20-Called and patient answered, she didn't want to talk, she didn't want to answer questions, she stated she has an appointment at the office tomorrow and she would answer the questions then and hung up.    Star Rating Drugs: Valsartan 02/20/20 [id number] Assistant [phone]

## 2020-05-22 ENCOUNTER — Other Ambulatory Visit: Payer: Medicare HMO

## 2020-05-22 ENCOUNTER — Other Ambulatory Visit: Payer: Self-pay

## 2020-05-22 DIAGNOSIS — E0590 Thyrotoxicosis, unspecified without thyrotoxic crisis or storm: Secondary | ICD-10-CM

## 2020-05-23 LAB — TSH: TSH: 2.23 u[IU]/mL (ref 0.450–4.500)

## 2020-05-24 ENCOUNTER — Other Ambulatory Visit: Payer: Self-pay

## 2020-05-24 DIAGNOSIS — H524 Presbyopia: Secondary | ICD-10-CM | POA: Diagnosis not present

## 2020-05-24 DIAGNOSIS — Z0100 Encounter for examination of eyes and vision without abnormal findings: Secondary | ICD-10-CM | POA: Diagnosis not present

## 2020-05-24 MED ORDER — LEVOTHYROXINE SODIUM 100 MCG PO TABS: 100.0000 ug | ORAL_TABLET | Freq: Every day | ORAL | 2 refills | Status: DC

## 2020-06-06 DIAGNOSIS — Z6841 Body Mass Index (BMI) 40.0 and over, adult: Secondary | ICD-10-CM | POA: Diagnosis not present

## 2020-06-06 DIAGNOSIS — R17 Unspecified jaundice: Secondary | ICD-10-CM | POA: Diagnosis not present

## 2020-06-06 DIAGNOSIS — E66813 Obesity, class 3: Secondary | ICD-10-CM

## 2020-06-06 DIAGNOSIS — E0590 Thyrotoxicosis, unspecified without thyrotoxic crisis or storm: Secondary | ICD-10-CM | POA: Diagnosis not present

## 2020-06-06 DIAGNOSIS — I5020 Unspecified systolic (congestive) heart failure: Secondary | ICD-10-CM | POA: Diagnosis not present

## 2020-06-06 DIAGNOSIS — I110 Hypertensive heart disease with heart failure: Secondary | ICD-10-CM | POA: Diagnosis not present

## 2020-06-06 DIAGNOSIS — E0500 Thyrotoxicosis with diffuse goiter without thyrotoxic crisis or storm: Secondary | ICD-10-CM | POA: Diagnosis not present

## 2020-06-06 DIAGNOSIS — E559 Vitamin D deficiency, unspecified: Secondary | ICD-10-CM | POA: Diagnosis not present

## 2020-06-06 DIAGNOSIS — E782 Mixed hyperlipidemia: Secondary | ICD-10-CM | POA: Diagnosis not present

## 2020-06-06 DIAGNOSIS — E890 Postprocedural hypothyroidism: Secondary | ICD-10-CM | POA: Diagnosis not present

## 2020-06-06 HISTORY — DX: Obesity, class 3: E66.813

## 2020-06-19 ENCOUNTER — Telehealth: Payer: Medicare HMO

## 2020-07-05 ENCOUNTER — Other Ambulatory Visit: Payer: Self-pay

## 2020-07-05 ENCOUNTER — Ambulatory Visit (INDEPENDENT_AMBULATORY_CARE_PROVIDER_SITE_OTHER): Payer: Medicare HMO

## 2020-07-05 DIAGNOSIS — E782 Mixed hyperlipidemia: Secondary | ICD-10-CM

## 2020-07-05 DIAGNOSIS — I10 Essential (primary) hypertension: Secondary | ICD-10-CM
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
## 2020-07-10 NOTE — Patient Instructions (Signed)
Visit Information   Goals Addressed             This Visit's Progress    Lifestyle Change-Hypertension       Timeframe:  Long-Range Goal Priority:  High Start Date:                             Expected End Date:                       Follow Up Date 06/2021    - ask questions to understand    Why is this important?   The changes that you are asked to make may be hard to do.  This is especially true when the changes are life-long.  Knowing why it is important to you is the first step.  Working on the change with your family or support person helps you not feel alone.  Reward yourself and family or support person when goals are met. This can be an activity you choose like bowling, hiking, biking, swimming or shooting hoops.     Notes:       Manage My Medicine       Timeframe:  Long-Range Goal Priority:  High Start Date:                             Expected End Date:                       Follow Up Date 06/2021    - call for medicine refill 2 or 3 days before it runs out - keep a list of all the medicines I take; vitamins and herbals too - use a pillbox to sort medicine    Why is this important?   These steps will help you keep on track with your medicines.   Notes:       Track and Manage My Blood Pressure-Hypertension       Timeframe:  Long-Range Goal Priority:  High Start Date:                             Expected End Date:                       Follow Up Date 06/2021    - check blood pressure weekly    Why is this important?   You won't feel high blood pressure, but it can still hurt your blood vessels.  High blood pressure can cause heart or kidney problems. It can also cause a stroke.  Making lifestyle changes like losing a little weight or eating less salt will help.  Checking your blood pressure at home and at different times of the day can help to control blood pressure.  If the doctor prescribes medicine remember to take it the way the doctor  ordered.  Call the office if you cannot afford the medicine or if there are questions about it.     Notes:         Patient Care Plan: CCM Pharmacy Care Plan     Problem Identified: hypothyroidism, hypertension, hyperlipidemia   Priority: High  Onset Date: 07/07/2020     Long-Range Goal: disease state management   Start Date: 07/07/2020  Expected End Date: 07/07/2021  This Visit's Progress: On track  Priority: High  Note:    Current Barriers:  Unable to independently afford treatment regimen  Pharmacist Clinical Goal(s):  Patient will verbalize ability to afford treatment regimen through collaboration with PharmD and provider.   Interventions: 1:1 collaboration with Cox, Elnita Maxwell, MD regarding development and update of comprehensive plan of care as evidenced by provider attestation and co-signature Inter-disciplinary care team collaboration (see longitudinal plan of care) Comprehensive medication review performed; medication list updated in electronic medical record  Hypertension (BP goal <140/90) -Not ideally controlled -Current treatment: Clonidine 0.1 mg bid  Furosemide 20 mg daily  Valsartan 160 mg daily  -Medications previously tried: atenolol, diltiazem, irbesartan-hydrochlorothiazide -Current home readings: well controlled per patient  -Current dietary habits: eating very healthy - salads, lean meats -Current exercise habits: exercising at the [facility] regularly -Denies hypotensive/hypertensive symptoms -Educated on BP goals and benefits of medications for prevention of heart attack, stroke and kidney damage; Daily salt intake goal < 2300 mg; Exercise goal of 150 minutes per week; -Counseled to monitor BP at home as needed, document, and provide log at future appointments -Counseled on diet and exercise extensively Recommended to continue current medication  Hyperlipidemia: (LDL goal < 100) -Uncontrolled with last labs -Current treatment: Atorvastatin 20 mg daily   -Medications previously tried: rosuvastatin   -Current dietary patterns: eating very healthy - fruit, vegetables and lean protein -Current exercise habits: exercising at the [facility] on treadmill -Educated on Cholesterol goals;  Benefits of statin for ASCVD risk reduction; Importance of limiting foods high in cholesterol; Exercise goal of 150 minutes per week; -Counseled on diet and exercise extensively Recommended to continue current medication   Health Maintenance -Vaccine gaps: tdap, shingles and covid booster  -Counseled on diet and exercise extensively Recommended to continue current medication  Patient Goals/Self-Care Activities Patient will:  - take medications as prescribed target a minimum of 150 minutes of moderate intensity exercise weekly engage in dietary modifications by continuing healthy habits of lean protein, vegetables and fruit  Follow Up Plan: Telephone follow up appointment with care management team member scheduled for: 02/2021      The patient verbalized understanding of instructions, educational materials, and care plan provided today and declined offer to receive copy of patient instructions, educational materials, and care plan.  Telephone follow up appointment with pharmacy team member scheduled for: 02/2021  Burnice Logan, [facility]

## 2020-07-13 ENCOUNTER — Encounter: Payer: Self-pay | Admitting: Family Medicine

## 2020-07-13 ENCOUNTER — Ambulatory Visit (INDEPENDENT_AMBULATORY_CARE_PROVIDER_SITE_OTHER): Payer: Medicare HMO | Admitting: Family Medicine

## 2020-07-13 ENCOUNTER — Other Ambulatory Visit: Payer: Self-pay

## 2020-07-13 ENCOUNTER — Ambulatory Visit (INDEPENDENT_AMBULATORY_CARE_PROVIDER_SITE_OTHER): Payer: Medicare HMO

## 2020-07-13 VITALS — BP 158/100 | HR 76 | Temp 97.1°F | Resp 16 | Ht 62.0 in | Wt 242.0 lb

## 2020-07-13 DIAGNOSIS — R7303 Prediabetes: Secondary | ICD-10-CM | POA: Diagnosis not present

## 2020-07-13 DIAGNOSIS — E890 Postprocedural hypothyroidism: Secondary | ICD-10-CM

## 2020-07-13 DIAGNOSIS — I5042 Chronic combined systolic (congestive) and diastolic (congestive) heart failure: Secondary | ICD-10-CM | POA: Diagnosis not present

## 2020-07-13 DIAGNOSIS — E559 Vitamin D deficiency, unspecified: Secondary | ICD-10-CM

## 2020-07-13 DIAGNOSIS — E782 Mixed hyperlipidemia: Secondary | ICD-10-CM | POA: Diagnosis not present

## 2020-07-13 DIAGNOSIS — I10 Essential (primary) hypertension: Secondary | ICD-10-CM | POA: Diagnosis not present

## 2020-07-13 DIAGNOSIS — Z23 Encounter for immunization: Secondary | ICD-10-CM | POA: Diagnosis not present

## 2020-07-13 MED ORDER — VALSARTAN-HYDROCHLOROTHIAZIDE 320-12.5 MG PO TABS: 1.0000 | ORAL_TABLET | Freq: Every day | ORAL | 0 refills | Status: DC

## 2020-07-13 MED ORDER — VORTIOXETINE HBR 10 MG PO TABS: 10.0000 mg | ORAL_TABLET | Freq: Every day | ORAL | 1 refills | Status: DC
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
## 2020-07-13 NOTE — Patient Instructions (Addendum)
Change valsartan/hctz 320/25 mg once daily.  Stop hctz and valsartan 160 mg.  Continue clonidine.  Hypertension, Adult Hypertension is another name for high blood pressure. High blood pressure forces your heart to work harder to pump blood. This can cause problems overtime. There are two numbers in a blood pressure reading. There is a top number (systolic) over a bottom number (diastolic). It is best to have a blood pressure that is below 120/80. Healthy choicescan help lower your blood pressure, or you may need medicine to help lower it. What are the causes? The cause of this condition is not known. Some conditions may be related tohigh blood pressure. What increases the risk? Smoking. Having type 2 diabetes mellitus, high cholesterol, or both. Not getting enough exercise or physical activity. Being overweight. Having too much fat, sugar, calories, or salt (sodium) in your diet. Drinking too much alcohol. Having long-term (chronic) kidney disease. Having a family history of high blood pressure. Age. Risk increases with age. Race. You may be at higher risk if you are African American. Gender. Men are at higher risk than women before age 61. After age 31, women are at higher risk than men. Having obstructive sleep apnea. Stress. What are the signs or symptoms? High blood pressure may not cause symptoms. Very high blood pressure (hypertensive crisis) may cause: Headache. Feelings of worry or nervousness (anxiety). Shortness of breath. Nosebleed. A feeling of being sick to your stomach (nausea). Throwing up (vomiting). Changes in how you see. Very bad chest pain. Seizures. How is this treated? This condition is treated by making healthy lifestyle changes, such as: Eating healthy foods. Exercising more. Drinking less alcohol. Your health care provider may prescribe medicine if lifestyle changes are not enough to get your blood pressure under control, and if: Your top number is  above 130. Your bottom number is above 80. Your personal target blood pressure may vary. Follow these instructions at home: Eating and drinking  If told, follow the DASH eating plan. To follow this plan: Fill one half of your plate at each meal with fruits and vegetables. Fill one fourth of your plate at each meal with whole grains. Whole grains include whole-wheat pasta, brown rice, and whole-grain bread. Eat or drink low-fat dairy products, such as skim milk or low-fat yogurt. Fill one fourth of your plate at each meal with low-fat (lean) proteins. Low-fat proteins include fish, chicken without skin, eggs, beans, and tofu. Avoid fatty meat, cured and processed meat, or chicken with skin. Avoid pre-made or processed food. Eat less than 1,500 mg of salt each day. Do not drink alcohol if: Your doctor tells you not to drink. You are pregnant, may be pregnant, or are planning to become pregnant. If you drink alcohol: Limit how much you use to: 0-1 drink a day for women. 0-2 drinks a day for men. Be aware of how much alcohol is in your drink. In the [place] one drink equals one 12 oz bottle of beer (355 mL), one 5 oz glass of wine (148 mL), or one 1 oz glass of hard liquor (44 mL).  Lifestyle  Work with your doctor to stay at a healthy weight or to lose weight. Ask your doctor what the best weight is for you. Get at least 30 minutes of exercise most days of the week. This may include walking, swimming, or biking. Get at least 30 minutes of exercise that strengthens your muscles (resistance exercise) at least 3 days a week. This may include lifting  weights or doing Pilates. Do not use any products that contain nicotine or tobacco, such as cigarettes, e-cigarettes, and chewing tobacco. If you need help quitting, ask your doctor. Check your blood pressure at home as told by your doctor. Keep all follow-up visits as told by your doctor. This is important.  Medicines Take over-the-counter and  prescription medicines only as told by your doctor. Follow directions carefully. Do not skip doses of blood pressure medicine. The medicine does not work as well if you skip doses. Skipping doses also puts you at risk for problems. Ask your doctor about side effects or reactions to medicines that you should watch for. Contact a doctor if you: Think you are having a reaction to the medicine you are taking. Have headaches that keep coming back (recurring). Feel dizzy. Have swelling in your ankles. Have trouble with your vision. Get help right away if you: Get a very bad headache. Start to feel mixed up (confused). Feel weak or numb. Feel faint. Have very bad pain in your: Chest. Belly (abdomen). Throw up more than once. Have trouble breathing. Summary Hypertension is another name for high blood pressure. High blood pressure forces your heart to work harder to pump blood. For most people, a normal blood pressure is less than 120/80. Making healthy choices can help lower blood pressure. If your blood pressure does not get lower with healthy choices, you may need to take medicine. This information is not intended to replace advice given to you by your health care provider. Make sure you discuss any questions you have with your healthcare provider. Document Revised: 09/09/2017 Document Reviewed: 09/09/2017 Elsevier Patient Education  [place].

## 2020-07-14 LAB — COMPREHENSIVE METABOLIC PANEL
ALT: 14 IU/L (ref 0–32)
AST: 20 IU/L (ref 0–40)
Albumin/Globulin Ratio: 1.6 (ref 1.2–2.2)
Albumin: 4.7 g/dL (ref 3.8–4.8)
Alkaline Phosphatase: 128 IU/L — ABNORMAL HIGH (ref 44–121)
BUN/Creatinine Ratio: 20 (ref 12–28)
BUN: 13 mg/dL (ref 8–27)
Bilirubin Total: 0.9 mg/dL (ref 0.0–1.2)
CO2: 23 mmol/L (ref 20–29)
Calcium: 9.4 mg/dL (ref 8.7–10.3)
Chloride: 96 mmol/L (ref 96–106)
Creatinine, Ser: 0.66 mg/dL (ref 0.57–1.00)
Globulin, Total: 2.9 g/dL (ref 1.5–4.5)
Glucose: 88 mg/dL (ref 65–99)
Potassium: 3.8 mmol/L (ref 3.5–5.2)
Sodium: 137 mmol/L (ref 134–144)
Total Protein: 7.6 g/dL (ref 6.0–8.5)
eGFR: 99 mL/min/{1.73_m2} (ref >59)

## 2020-07-14 LAB — CBC WITH DIFFERENTIAL/PLATELET
Basophils Absolute: 0 10*3/uL (ref 0.0–0.2)
Basos: 1 %
EOS (ABSOLUTE): 0.1 10*3/uL (ref 0.0–0.4)
Eos: 3 %
Hematocrit: 35.6 % (ref 34.0–46.6)
Hemoglobin: 11.5 g/dL (ref 11.1–15.9)
Immature Grans (Abs): 0 10*3/uL (ref 0.0–0.1)
Immature Granulocytes: 0 %
Lymphocytes Absolute: 1.4 10*3/uL (ref 0.7–3.1)
Lymphs: 28 %
MCH: 25.9 pg — ABNORMAL LOW (ref 26.6–33.0)
MCHC: 32.3 g/dL (ref 31.5–35.7)
MCV: 80 fL (ref 79–97)
Monocytes Absolute: 0.5 10*3/uL (ref 0.1–0.9)
Monocytes: 9 %
Neutrophils Absolute: 3 10*3/uL (ref 1.4–7.0)
Neutrophils: 59 %
Platelets: 230 10*3/uL (ref 150–450)
RBC: 4.44 x10E6/uL (ref 3.77–5.28)
RDW: 14.5 % (ref 11.7–15.4)
WBC: 5.1 10*3/uL (ref 3.4–10.8)

## 2020-07-14 LAB — LIPID PANEL
Chol/HDL Ratio: 2.4 ratio (ref 0.0–4.4)
Cholesterol, Total: 231 mg/dL — ABNORMAL HIGH (ref 100–199)
HDL: 96 mg/dL (ref >39)
LDL Chol Calc (NIH): 124 mg/dL — ABNORMAL HIGH (ref 0–99)
Triglycerides: 64 mg/dL (ref 0–149)
VLDL Cholesterol Cal: 11 mg/dL (ref 5–40)

## 2020-07-14 LAB — CARDIOVASCULAR RISK ASSESSMENT

## 2020-07-14 LAB — HEMOGLOBIN A1C
Est. average glucose Bld gHb Est-mCnc: 120 mg/dL
Hgb A1c MFr Bld: 5.8 % — ABNORMAL HIGH (ref 4.8–5.6)

## 2020-07-14 LAB — VITAMIN D 25 HYDROXY (VIT D DEFICIENCY, FRACTURES): Vit D, 25-Hydroxy: 20.1 ng/mL — ABNORMAL LOW (ref 30.0–100.0)

## 2020-07-17 ENCOUNTER — Other Ambulatory Visit: Payer: Self-pay

## 2020-07-17 DIAGNOSIS — E559 Vitamin D deficiency, unspecified: Secondary | ICD-10-CM

## 2020-07-17 MED ORDER — VITAMIN D (ERGOCALCIFEROL) 1.25 MG (50000 UNIT) PO CAPS: 50000.0000 [IU] | ORAL_CAPSULE | ORAL | 2 refills | Status: DC

## 2020-07-17 MED ORDER — ATORVASTATIN CALCIUM 40 MG PO TABS: 40.0000 mg | ORAL_TABLET | Freq: Every day | ORAL | 0 refills | Status: DC

## 2020-07-17 NOTE — Progress Notes (Signed)
Called patient made aware of results, verbalized understanding. Patient stated she was only taking vitamin D once a week, I sent in refill for Vitamin D and lipitor.

## 2020-07-24 ENCOUNTER — Other Ambulatory Visit: Payer: Self-pay | Admitting: Family Medicine

## 2020-08-02 DIAGNOSIS — I712 Thoracic aortic aneurysm, without rupture, unspecified: Secondary | ICD-10-CM | POA: Insufficient documentation

## 2020-08-02 HISTORY — DX: Thoracic aortic aneurysm, without rupture, unspecified: I71.20

## 2020-08-03 ENCOUNTER — Other Ambulatory Visit: Payer: Self-pay

## 2020-08-03 MED ORDER — VORTIOXETINE HBR 10 MG PO TABS: 10.0000 mg | ORAL_TABLET | Freq: Every day | ORAL | 1 refills | Status: DC

## 2020-08-13 ENCOUNTER — Ambulatory Visit (INDEPENDENT_AMBULATORY_CARE_PROVIDER_SITE_OTHER): Payer: Medicare HMO | Admitting: Family Medicine

## 2020-08-13 ENCOUNTER — Other Ambulatory Visit: Payer: Self-pay

## 2020-08-13 VITALS — BP 150/88 | HR 72 | Temp 97.2°F | Resp 16 | Ht 62.0 in | Wt 246.0 lb

## 2020-08-13 DIAGNOSIS — I10 Essential (primary) hypertension: Secondary | ICD-10-CM | POA: Diagnosis not present

## 2020-08-13 DIAGNOSIS — R7303 Prediabetes: Secondary | ICD-10-CM

## 2020-08-13 DIAGNOSIS — Z6841 Body Mass Index (BMI) 40.0 and over, adult: Secondary | ICD-10-CM

## 2020-08-13 MED ORDER — AMLODIPINE BESYLATE 5 MG PO TABS: 5.0000 mg | ORAL_TABLET | Freq: Every day | ORAL | 0 refills | Status: DC

## 2020-08-13 MED ORDER — OZEMPIC (0.25 OR 0.5 MG/DOSE) 2 MG/1.5ML ~~LOC~~ SOPN: 0.2500 mg | PEN_INJECTOR | SUBCUTANEOUS | 0 refills | Status: DC
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
## 2020-08-13 NOTE — Patient Instructions (Signed)
Start amlodipine 5 mg once daily.  Start ozempic 0.25 mg once weekly.

## 2020-08-19 ENCOUNTER — Encounter: Payer: Self-pay | Admitting: Family Medicine

## 2020-08-23 ENCOUNTER — Telehealth: Payer: Self-pay

## 2020-08-23 NOTE — Chronic Care Management (AMB) (Signed)
Chronic Care Management Pharmacy Assistant   Name: Donna Mayer  MRN: [id number] DOB: Oct 26, 1955  Reason for Encounter: Patient Assistance Coordination  08/23/2020-  Daryll Brod Patient assistance program to follow up on Trintellix 10 mg, spoke with Chantelle she informed me that patient has been approved since the beginning of the year and enrollment ends 01/12/2021. She noticed that patient has not had a shipment since 03/06/2020 and was able to place a refill for patient today. Patient will receive medication in 5-10 business days to her home.  Called patient to make aware but patient had concerns that they will ask her for the copay of $250 before mailing medication, she stated she is unable to afford that and that is why she has not received medication, assured patient that with the patient assistance program she should get medication for no cost but will call [employer] right back to verify. Called [employer] patient assistance program back and spoke with Randall Hiss, he confirmed that since patient became approved with the program she will received medication at no cost, 100% free of charge, he noticed that on her profile back in January she used the copay/savings card and that would prompt a copay but since her enrollment was approved she will not have to worry about a charge. Called patient back again to inform, she is aware and very thankful for the help. Donette Larry, CPP notified.    Medications: Outpatient Encounter Medications as of 08/23/2020  Medication Sig   amLODipine (NORVASC) 5 MG tablet Take 1 tablet (5 mg total) by mouth daily.   atorvastatin (LIPITOR) 40 MG tablet Take 1 tablet (40 mg total) by mouth daily.   cloNIDine (CATAPRES) 0.1 MG tablet TAKE 1 TABLET TWICE DAILY (Patient taking differently: Take 0.1 mg by mouth 2 (two) times daily.)   ferrous sulfate 325 (65 FE) MG tablet Take 325 mg by mouth daily with breakfast.   furosemide (LASIX) 40 MG tablet Take 1 tablet (40 mg total) by  mouth daily. (Patient taking differently: Take 20 mg by mouth daily.)   levothyroxine (SYNTHROID) 112 MCG tablet Take 1 tablet by mouth daily.   magnesium oxide (MAG-OX) 400 MG tablet Take 400 mg by mouth daily.   NEXIUM 40 MG capsule Take twice a day by mouth. (Patient taking differently: Take 40 mg by mouth daily at 12 noon.)   Polyethyl Glycol-Propyl Glycol (SYSTANE) 0.4-0.3 % GEL ophthalmic gel Place 1 application into both eyes every 4 (four) hours as needed.   Semaglutide,0.25 or 0.5MG/DOS, (OZEMPIC, 0.25 OR 0.5 MG/DOSE,) 2 MG/1.5ML SOPN Inject 0.25 mg into the skin once a week.   valsartan-hydrochlorothiazide (DIOVAN-HCT) 320-12.5 MG tablet Take 1 tablet by mouth daily.   Vitamin D, Ergocalciferol, (DRISDOL) 1.25 MG (50000 UNIT) CAPS capsule Take 1 capsule (50,000 Units total) by mouth 3 (three) times a week.   vortioxetine HBr (TRINTELLIX) 10 MG TABS tablet Take 1 tablet (10 mg total) by mouth daily.   White Petrolatum-Mineral Oil (SYSTANE NIGHTTIME) OINT Apply 1 application to eye at bedtime. (Patient not taking: Reported on 07/07/2020)   zolpidem (AMBIEN CR) 12.5 MG CR tablet TAKE 1 TABLET(12.5 MG) BY MOUTH AT BEDTIME AS NEEDED FOR SLEEP   No facility-administered encounter medications on file as of 08/23/2020.    Care Gaps: Hepatitis C Screening- Overdue - never done  TETANUS/TDAP (Every 10 Years)-  Never Done  Zoster Vaccines- Shingrix (1 of 2)- Due PAP SMEAR-Modifier (Every 3 Years)- Last completed: Oct 19, 2015 INFLUENZA VACCINE - Due  Star Rating Drugs: Ozempic- sent to [facility] on 08/13/2020.  Valsartan-Hctz 320/12.5 mg- Last filled 07/13/2020 for 90 day supply at [facility] Atorvastatin 40 mg- Last filled 07/17/2020 for 90 day supply at [place] [facility]  Follow up visit with CCM Pharmacist Donette Larry on 02/18/2021 at 10:00 AM  Pattricia Boss, [facility] Pharmacist Assistant [phone]

## 2020-09-21 ENCOUNTER — Telehealth: Payer: Self-pay

## 2020-09-21 NOTE — Chronic Care Management (AMB) (Unsigned)
Chronic Care Management Pharmacy Assistant   Name: Donna Mayer  MRN: [id number] DOB: [birth year]  Reason for Encounter: Hypertension Disease State Call  Recent office visits:  No visits noted  Recent consult visits:  No visits noted  Hospital visits:  None in previous 6 months  Medications: Outpatient Encounter Medications as of 09/21/2020  Medication Sig   amLODipine (NORVASC) 5 MG tablet Take 1 tablet (5 mg total) by mouth daily.   atorvastatin (LIPITOR) 40 MG tablet Take 1 tablet (40 mg total) by mouth daily.   cloNIDine (CATAPRES) 0.1 MG tablet TAKE 1 TABLET TWICE DAILY (Patient taking differently: Take 0.1 mg by mouth 2 (two) times daily.)   ferrous sulfate 325 (65 FE) MG tablet Take 325 mg by mouth daily with breakfast.   furosemide (LASIX) 40 MG tablet Take 1 tablet (40 mg total) by mouth daily. (Patient taking differently: Take 20 mg by mouth daily.)   levothyroxine (SYNTHROID) 112 MCG tablet Take 1 tablet by mouth daily.   magnesium oxide (MAG-OX) 400 MG tablet Take 400 mg by mouth daily.   NEXIUM 40 MG capsule Take twice a day by mouth. (Patient taking differently: Take 40 mg by mouth daily at 12 noon.)   Polyethyl Glycol-Propyl Glycol (SYSTANE) 0.4-0.3 % GEL ophthalmic gel Place 1 application into both eyes every 4 (four) hours as needed.   Semaglutide,0.25 or 0.5MG/DOS, (OZEMPIC, 0.25 OR 0.5 MG/DOSE,) 2 MG/1.5ML SOPN Inject 0.25 mg into the skin once a week.   valsartan-hydrochlorothiazide (DIOVAN-HCT) 320-12.5 MG tablet Take 1 tablet by mouth daily.   Vitamin D, Ergocalciferol, (DRISDOL) 1.25 MG (50000 UNIT) CAPS capsule Take 1 capsule (50,000 Units total) by mouth 3 (three) times a week.   vortioxetine HBr (TRINTELLIX) 10 MG TABS tablet Take 1 tablet (10 mg total) by mouth daily.   White Petrolatum-Mineral Oil (SYSTANE NIGHTTIME) OINT Apply 1 application to eye at bedtime. (Patient not taking: Reported on 07/07/2020)   zolpidem (AMBIEN CR) 12.5 MG CR tablet TAKE 1  TABLET(12.5 MG) BY MOUTH AT BEDTIME AS NEEDED FOR SLEEP   No facility-administered encounter medications on file as of 09/21/2020.     Recent Office Vitals: BP Readings from Last 3 Encounters:  08/13/20 (!) 150/88  07/13/20 (!) 158/100  04/05/20 (!) 180/84   Pulse Readings from Last 3 Encounters:  08/13/20 72  07/13/20 76  04/05/20 100    Wt Readings from Last 3 Encounters:  08/13/20 246 lb (111.6 kg)  07/13/20 242 lb (109.8 kg)  04/05/20 222 lb (100.7 kg)     Kidney Function Lab Results  Component Value Date/Time   CREATININE 0.66 07/13/2020 08:49 AM   CREATININE 0.66 04/06/2020 11:34 AM   GFRNONAA 96 02/16/2020 03:35 PM   GFRNONAA >60 11/21/2019 03:04 AM   GFRAA 111 02/16/2020 03:35 PM    BMP Latest Ref Rng & Units 07/13/2020 04/06/2020 02/16/2020  Glucose 65 - 99 mg/dL 88 91 102(H)  BUN 8 - 27 mg/dL 13 12 21   Creatinine 0.57 - 1.00 mg/dL 0.66 0.66 0.64  BUN/Creat Ratio 12 - 28 20 18  33(H)  Sodium 134 - 144 mmol/L 137 138 139  Potassium 3.5 - 5.2 mmol/L 3.8 4.4 3.9  Chloride 96 - 106 mmol/L 96 98 99  CO2 20 - 29 mmol/L 23 23 26   Calcium 8.7 - 10.3 mg/dL 9.4 9.6 9.3     Current antihypertensive regimen:  Clonidine 0.1 mg bid  Furosemide 20 mg daily  Valsartan 160 mg daily   Patient  verbally confirms she is taking the above medications as directed. {yes/no:20286}  How often are you checking your Blood Pressure? {CHL HP BP Monitoring Frequency:[phone]}  she checks her blood pressure {timing:25218} {before/after:25217} taking her medication.  Current home BP readings: ***  DATE:             BP               PULSE    Wrist or arm cuff: Caffeine intake: Salt intake: OTC medications including pseudoephedrine or NSAIDs?  Any readings above 180/120? {yes/no:20286} If yes any symptoms of hypertensive emergency? {hypertensive emergency symptoms:25354}   What recent interventions/DTPs have been made by any provider to improve Blood Pressure control since last  CPP Visit: ***  Any recent hospitalizations or ED visits since last visit with CPP? {yes/no:20286}  What diet changes have been made to improve Blood Pressure Control?  ***  What exercise is being done to improve your Blood Pressure Control?  ***  Adherence Review: Is the patient currently on ACE/ARB medication? Yes Does the patient have >5 day gap between last estimated fill dates? No Clonidine 0.1 mg-90 DS last filled 08/11/20 Furosemide 20 mg- 30 DS last filled 06/26/20  Care Gaps: Last annual wellness visit? 12/03/17  Star Rating Drugs:  Medication:  Last Fill: Day Supply Atorvastatin 40 mg  07/17/20 90 DS Ozempic Valsartan- HCTZ 07/13/20 90 DS

## 2020-09-24 DIAGNOSIS — Z6841 Body Mass Index (BMI) 40.0 and over, adult: Secondary | ICD-10-CM | POA: Diagnosis not present

## 2020-09-24 DIAGNOSIS — E559 Vitamin D deficiency, unspecified: Secondary | ICD-10-CM | POA: Diagnosis not present

## 2020-09-24 DIAGNOSIS — I110 Hypertensive heart disease with heart failure: Secondary | ICD-10-CM | POA: Diagnosis not present

## 2020-09-24 DIAGNOSIS — I5020 Unspecified systolic (congestive) heart failure: Secondary | ICD-10-CM | POA: Diagnosis not present

## 2020-09-24 DIAGNOSIS — E0500 Thyrotoxicosis with diffuse goiter without thyrotoxic crisis or storm: Secondary | ICD-10-CM | POA: Diagnosis not present

## 2020-09-24 DIAGNOSIS — E890 Postprocedural hypothyroidism: Secondary | ICD-10-CM | POA: Diagnosis not present

## 2020-09-29 ENCOUNTER — Telehealth: Payer: Self-pay

## 2020-09-29 NOTE — Telephone Encounter (Signed)
I called patient to set up an appointment for AWV, but voicemail is not set up yet.

## 2020-10-01 DIAGNOSIS — E0500 Thyrotoxicosis with diffuse goiter without thyrotoxic crisis or storm: Secondary | ICD-10-CM | POA: Diagnosis not present

## 2020-10-11 NOTE — Progress Notes (Unsigned)
Donna Mayer has been called 3 times this month to complete her HTN assessment and has been unsuccessful. Lvm to call us back   Bell Arthur

## 2020-10-15 ENCOUNTER — Other Ambulatory Visit: Payer: Medicare HMO

## 2020-10-16 ENCOUNTER — Encounter: Payer: Self-pay | Admitting: Family Medicine

## 2020-10-16 ENCOUNTER — Ambulatory Visit (INDEPENDENT_AMBULATORY_CARE_PROVIDER_SITE_OTHER): Payer: Medicare HMO | Admitting: Family Medicine

## 2020-10-16 ENCOUNTER — Other Ambulatory Visit: Payer: Self-pay

## 2020-10-16 VITALS — BP 132/84 | HR 71 | Temp 97.6°F | Resp 18 | Ht 62.0 in | Wt 246.8 lb

## 2020-10-16 DIAGNOSIS — F330 Major depressive disorder, recurrent, mild: Secondary | ICD-10-CM | POA: Diagnosis not present

## 2020-10-16 DIAGNOSIS — I10 Essential (primary) hypertension: Secondary | ICD-10-CM | POA: Diagnosis not present

## 2020-10-16 DIAGNOSIS — K219 Gastro-esophageal reflux disease without esophagitis: Secondary | ICD-10-CM

## 2020-10-16 DIAGNOSIS — Z6841 Body Mass Index (BMI) 40.0 and over, adult: Secondary | ICD-10-CM

## 2020-10-16 DIAGNOSIS — R7303 Prediabetes: Secondary | ICD-10-CM

## 2020-10-16 DIAGNOSIS — E66813 Obesity, class 3: Secondary | ICD-10-CM

## 2020-10-16 DIAGNOSIS — E890 Postprocedural hypothyroidism: Secondary | ICD-10-CM | POA: Diagnosis not present

## 2020-10-16 DIAGNOSIS — E782 Mixed hyperlipidemia: Secondary | ICD-10-CM

## 2020-10-16 DIAGNOSIS — E559 Vitamin D deficiency, unspecified: Secondary | ICD-10-CM

## 2020-10-16 DIAGNOSIS — Z23 Encounter for immunization: Secondary | ICD-10-CM

## 2020-10-16 DIAGNOSIS — D508 Other iron deficiency anemias: Secondary | ICD-10-CM

## 2020-10-16 MED ORDER — OZEMPIC (1 MG/DOSE) 4 MG/3ML ~~LOC~~ SOPN: 1.0000 mg | PEN_INJECTOR | SUBCUTANEOUS | 0 refills | Status: DC

## 2020-10-16 NOTE — Assessment & Plan Note (Signed)
Well controlled . Continue amlodipine 10 mg once daily, clonidine 0.1 mg one twice a day, and valsartan/hct 320-12.5 mg once daily

## 2020-10-16 NOTE — Assessment & Plan Note (Signed)
Continue trintillex at 10 mg once daily.  Ambien cr for insomnia.

## 2020-10-16 NOTE — Assessment & Plan Note (Signed)
Well-controlled on Nexium 40 mg twice daily.

## 2020-10-16 NOTE — Assessment & Plan Note (Addendum)
Well-controlled.  Continue Lipitor 40 mg once daily. Continue low-fat diet and exercise

## 2020-10-16 NOTE — Assessment & Plan Note (Signed)
Management per endocrinology.  Per the patient she is therapeutic currently.

## 2020-10-16 NOTE — Assessment & Plan Note (Signed)
Recommend continue to work on eating healthy diet and exercise. Increase ozempic to 1 mg weekly.

## 2020-10-16 NOTE — Patient Instructions (Signed)
Increase ozempic to 1 mg once weekly
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
## 2020-10-16 NOTE — Assessment & Plan Note (Signed)
Continue iron sulfate. 

## 2020-10-17 LAB — LIPID PANEL
Chol/HDL Ratio: 2.1 ratio (ref 0.0–4.4)
Cholesterol, Total: 176 mg/dL (ref 100–199)
HDL: 85 mg/dL (ref >39)
LDL Chol Calc (NIH): 78 mg/dL (ref 0–99)
Triglycerides: 72 mg/dL (ref 0–149)
VLDL Cholesterol Cal: 13 mg/dL (ref 5–40)

## 2020-10-17 LAB — CBC WITH DIFFERENTIAL/PLATELET
Basophils Absolute: 0 10*3/uL (ref 0.0–0.2)
Basos: 1 %
EOS (ABSOLUTE): 0.2 10*3/uL (ref 0.0–0.4)
Eos: 3 %
Hematocrit: 35.4 % (ref 34.0–46.6)
Hemoglobin: 11.6 g/dL (ref 11.1–15.9)
Immature Grans (Abs): 0 10*3/uL (ref 0.0–0.1)
Immature Granulocytes: 0 %
Lymphocytes Absolute: 1.5 10*3/uL (ref 0.7–3.1)
Lymphs: 24 %
MCH: 26.7 pg (ref 26.6–33.0)
MCHC: 32.8 g/dL (ref 31.5–35.7)
MCV: 81 fL (ref 79–97)
Monocytes Absolute: 0.6 10*3/uL (ref 0.1–0.9)
Monocytes: 10 %
Neutrophils Absolute: 3.8 10*3/uL (ref 1.4–7.0)
Neutrophils: 62 %
Platelets: 258 10*3/uL (ref 150–450)
RBC: 4.35 x10E6/uL (ref 3.77–5.28)
RDW: 14.4 % (ref 11.7–15.4)
WBC: 6.1 10*3/uL (ref 3.4–10.8)

## 2020-10-17 LAB — COMPREHENSIVE METABOLIC PANEL
ALT: 12 IU/L (ref 0–32)
AST: 17 IU/L (ref 0–40)
Albumin/Globulin Ratio: 1.3 (ref 1.2–2.2)
Albumin: 4.6 g/dL (ref 3.8–4.8)
Alkaline Phosphatase: 114 IU/L (ref 44–121)
BUN/Creatinine Ratio: 24 (ref 12–28)
BUN: 18 mg/dL (ref 8–27)
Bilirubin Total: 0.4 mg/dL (ref 0.0–1.2)
CO2: 25 mmol/L (ref 20–29)
Calcium: 10.1 mg/dL (ref 8.7–10.3)
Chloride: 97 mmol/L (ref 96–106)
Creatinine, Ser: 0.76 mg/dL (ref 0.57–1.00)
Globulin, Total: 3.6 g/dL (ref 1.5–4.5)
Glucose: 86 mg/dL (ref 70–99)
Potassium: 4.3 mmol/L (ref 3.5–5.2)
Sodium: 138 mmol/L (ref 134–144)
Total Protein: 8.2 g/dL (ref 6.0–8.5)
eGFR: 88 mL/min/{1.73_m2} (ref >59)

## 2020-10-17 LAB — CARDIOVASCULAR RISK ASSESSMENT

## 2020-10-17 LAB — HEMOGLOBIN A1C
Est. average glucose Bld gHb Est-mCnc: 114 mg/dL
Hgb A1c MFr Bld: 5.6 % (ref 4.8–5.6)

## 2020-10-18 NOTE — Progress Notes (Signed)
Blood count normal.  Liver function normal.  Kidney function normal.  Cholesterol: great. HBA1C: improved.  Continue current treatments.

## 2020-10-28 ENCOUNTER — Other Ambulatory Visit: Payer: Self-pay | Admitting: Family Medicine

## 2020-10-28 DIAGNOSIS — I10 Essential (primary) hypertension: Secondary | ICD-10-CM

## 2020-11-26 ENCOUNTER — Telehealth: Payer: Self-pay

## 2020-11-26 ENCOUNTER — Other Ambulatory Visit: Payer: Self-pay | Admitting: Family Medicine
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
## 2020-11-26 NOTE — Chronic Care Management (AMB) (Signed)
Chronic Care Management Pharmacy Assistant   Name: Donna Mayer  MRN: [id number] DOB: 12/01/1955   Reason for Encounter: Disease State call for HTN    Recent office visits:  10/16/20 Donna Brome MD. Seen for Prediabetes. Increased Amlodipine Besylate 5 mg daily to 10 mg daily. Increased Semaglutide from 0.25 weekly to 1 mg weekly. Patient reported taking Furosemide 20 mg daily instead of 40 mg daily.  Recent consult visits:  09/24/20 (Endocrinology) Seen for Post Surgical Hypothyroidism. Stopped the Atenolol and Started Amlodipine 10 mg daily. Pt reported not taking Lasix.   Hospital visits:  None   Medications: Outpatient Encounter Medications as of 11/26/2020  Medication Sig   amLODipine (NORVASC) 10 MG tablet Take 1 tablet by mouth daily.   atorvastatin (LIPITOR) 40 MG tablet TAKE 1 TABLET(40 MG) BY MOUTH DAILY   cloNIDine (CATAPRES) 0.1 MG tablet TAKE 1 TABLET TWICE DAILY (Patient taking differently: Take 0.1 mg by mouth 2 (two) times daily.)   ferrous sulfate 325 (65 FE) MG tablet Take 325 mg by mouth daily with breakfast.   levothyroxine (SYNTHROID) 112 MCG tablet Take 1 tablet by mouth daily.   magnesium oxide (MAG-OX) 400 MG tablet Take 400 mg by mouth daily.   NEXIUM 40 MG capsule Take twice a day by mouth. (Patient taking differently: Take 40 mg by mouth daily at 12 noon.)   Polyethyl Glycol-Propyl Glycol (SYSTANE) 0.4-0.3 % GEL ophthalmic gel Place 1 application into both eyes every 4 (four) hours as needed.   Semaglutide, 1 MG/DOSE, (OZEMPIC, 1 MG/DOSE,) 4 MG/3ML SOPN Inject 1 mg into the skin once a week.   valsartan-hydrochlorothiazide (DIOVAN-HCT) 320-12.5 MG tablet TAKE 1 TABLET BY MOUTH DAILY   Vitamin D, Ergocalciferol, (DRISDOL) 1.25 MG (50000 UNIT) CAPS capsule Take 1 capsule (50,000 Units total) by mouth 3 (three) times a week.   vortioxetine HBr (TRINTELLIX) 10 MG TABS tablet Take 1 tablet (10 mg total) by mouth daily.   White Petrolatum-Mineral Oil  (SYSTANE NIGHTTIME) OINT Apply 1 application to eye at bedtime.   zolpidem (AMBIEN CR) 12.5 MG CR tablet TAKE 1 TABLET(12.5 MG) BY MOUTH AT BEDTIME AS NEEDED FOR SLEEP   No facility-administered encounter medications on file as of 11/26/2020.   Recent Office Vitals: BP Readings from Last 3 Encounters:  10/16/20 132/84  08/13/20 (!) 150/88  07/13/20 (!) 158/100   Pulse Readings from Last 3 Encounters:  10/16/20 71  08/13/20 72  07/13/20 76    Wt Readings from Last 3 Encounters:  10/16/20 246 lb 12.8 oz (111.9 kg)  08/13/20 246 lb (111.6 kg)  07/13/20 242 lb (109.8 kg)     Kidney Function Lab Results  Component Value Date/Time   CREATININE 0.76 10/16/2020 07:57 AM   CREATININE 0.66 07/13/2020 08:49 AM   GFRNONAA 96 02/16/2020 03:35 PM   GFRNONAA >60 11/21/2019 03:04 AM   GFRAA 111 02/16/2020 03:35 PM    BMP Latest Ref Rng & Units 10/16/2020 07/13/2020 04/06/2020  Glucose 70 - 99 mg/dL 86 88 91  BUN 8 - 27 mg/dL 18 13 12   Creatinine 0.57 - 1.00 mg/dL 0.76 0.66 0.66  BUN/Creat Ratio 12 - 28 24 20 18   Sodium 134 - 144 mmol/L 138 137 138  Potassium 3.5 - 5.2 mmol/L 4.3 3.8 4.4  Chloride 96 - 106 mmol/L 97 96 98  CO2 20 - 29 mmol/L 25 23 23   Calcium 8.7 - 10.3 mg/dL 10.1 9.4 9.6     Current antihypertensive regimen:  Valsartan-HCTZ  320-12.5 mg  daily Amlodipine 10 mg daily  Patient verbally confirms she is taking the above medications as directed. Yes  How often are you checking your Blood Pressure? twice daily  she checks her blood pressure in the morning and in the evening after taking her medication.  Current home BP readings: I called pt twice to get readings and each time she states she's not at home. Pt did not seem to want or understand why were are calling her. Pt stated she is in the donut hole for her prescription drugs. Pt states her BP usually stay good but could not give me any readings at this time   Wrist or arm cuff: Arm  Caffeine intake:Pt does not  drink caffeine  Salt intake:Pt does not use salt  OTC medications including pseudoephedrine or NSAIDs? Not currently  Any readings above 180/120? No   What recent interventions/DTPs have been made by any provider to improve Blood Pressure control since last CPP Visit: No changes  Any recent hospitalizations or ED visits since last visit with CPP? No recent visit   What diet changes have been made to improve Blood Pressure Control?  Pt stated she does not use any salt in her foods or drinks any caffeine   What exercise is being done to improve your Blood Pressure Control?  Pt did not say   Adherence Review: Is the patient currently on ACE/ARB medication? Yes Does the patient have >5 day gap between last estimated fill dates? No   Care Gaps: Last annual wellness visit? None noted- Messaged Shelle Iron  Star Rating Drugs:  Medication:  Last Fill: Day Supply Valsartan-HCTZ 10/28/20 [place], [place] Pharmacist Assistant  [phone]

## 2020-11-27 NOTE — Telephone Encounter (Signed)
Pt returned call this morning. She is questioning since she has been on 1 mg for one month would it actually be time for her to increase to 2 mg.   Royce Macadamia, [place] 11/27/20 8:30 AM

## 2020-11-28 NOTE — Telephone Encounter (Signed)
Called pt. Made her aware. She is concerned since she is due for injection today. Have sent a message to Pattricia Boss for update/assistance.   Royce Macadamia, [place] 11/28/20 7:54 AM

## 2020-12-11 ENCOUNTER — Telehealth: Payer: Self-pay

## 2020-12-11 NOTE — Progress Notes (Signed)
    Chronic Care Management Pharmacy Assistant   Name: CHRISTEENA KROGH  MRN: [id number] DOB: [birth year]   Reason for Encounter: Patient Assistance Documentation  12/11/20 I received a message that the pt was approved to increase her Ozempic 72m to 2 mg. I sent the CMA a message to send in an updated RX with the increase to [facility] fax # [phone] I did try calling [facility] at # [phone] was on hold for over 6 minutes. Sending in an new RX should be sufficient in getting the increase.     Medications: Outpatient Encounter Medications as of 12/11/2020  Medication Sig   amLODipine (NORVASC) 10 MG tablet Take 1 tablet by mouth daily.   atorvastatin (LIPITOR) 40 MG tablet TAKE 1 TABLET(40 MG) BY MOUTH DAILY   cloNIDine (CATAPRES) 0.1 MG tablet TAKE 1 TABLET TWICE DAILY (Patient taking differently: Take 0.1 mg by mouth 2 (two) times daily.)   ferrous sulfate 325 (65 FE) MG tablet Take 325 mg by mouth daily with breakfast.   levothyroxine (SYNTHROID) 112 MCG tablet Take 1 tablet by mouth daily.   magnesium oxide (MAG-OX) 400 MG tablet Take 400 mg by mouth daily.   NEXIUM 40 MG capsule Take twice a day by mouth. (Patient taking differently: Take 40 mg by mouth daily at 12 noon.)   Polyethyl Glycol-Propyl Glycol (SYSTANE) 0.4-0.3 % GEL ophthalmic gel Place 1 application into both eyes every 4 (four) hours as needed.   Semaglutide, 1 MG/DOSE, (OZEMPIC, 1 MG/DOSE,) 4 MG/3ML SOPN Inject 1 mg into the skin once a week.   valsartan-hydrochlorothiazide (DIOVAN-HCT) 320-12.5 MG tablet TAKE 1 TABLET BY MOUTH DAILY   Vitamin D, Ergocalciferol, (DRISDOL) 1.25 MG (50000 UNIT) CAPS capsule Take 1 capsule (50,000 Units total) by mouth 3 (three) times a week.   vortioxetine HBr (TRINTELLIX) 10 MG TABS tablet Take 1 tablet (10 mg total) by mouth daily.   White Petrolatum-Mineral Oil (SYSTANE NIGHTTIME) OINT Apply 1 application to eye at bedtime.   zolpidem (AMBIEN CR) 12.5 MG CR tablet  TAKE 1 TABLET(12.5 MG) BY MOUTH AT BEDTIME AS NEEDED FOR SLEEP   No facility-administered encounter medications on file as of 12/11/2020.   DElray Mcgregor CGuttenbergPharmacist Assistant  [phone]

## 2020-12-26 ENCOUNTER — Telehealth: Payer: Self-pay
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
## 2021-01-02 ENCOUNTER — Telehealth: Payer: Self-pay

## 2021-01-02 NOTE — Progress Notes (Signed)
° ° °  Chronic Care Management Pharmacy Assistant   Name: Donna Mayer  MRN: [id number] DOB: [birth year]    Reason for Encounter: Patient Assistance Documentation   01/02/21- Pt PAP for Ozempic 25m has been filled out. I called pt to verify and she stated she received a renewal and its been filled out and she just has to take it up to Dr. CTobie Poetto fax for her. I filled another one out because I did not see her application in our folder but its there in case she needs it.     Medications: Outpatient Encounter Medications as of 01/02/2021  Medication Sig   amLODipine (NORVASC) 10 MG tablet Take 1 tablet by mouth daily.   atorvastatin (LIPITOR) 40 MG tablet TAKE 1 TABLET(40 MG) BY MOUTH DAILY   cloNIDine (CATAPRES) 0.1 MG tablet TAKE 1 TABLET TWICE DAILY (Patient taking differently: Take 0.1 mg by mouth 2 (two) times daily.)   ferrous sulfate 325 (65 FE) MG tablet Take 325 mg by mouth daily with breakfast.   levothyroxine (SYNTHROID) 112 MCG tablet Take 1 tablet by mouth daily.   magnesium oxide (MAG-OX) 400 MG tablet Take 400 mg by mouth daily.   NEXIUM 40 MG capsule Take twice a day by mouth. (Patient taking differently: Take 40 mg by mouth daily at 12 noon.)   Polyethyl Glycol-Propyl Glycol (SYSTANE) 0.4-0.3 % GEL ophthalmic gel Place 1 application into both eyes every 4 (four) hours as needed.   Semaglutide, 1 MG/DOSE, (OZEMPIC, 1 MG/DOSE,) 4 MG/3ML SOPN Inject 1 mg into the skin once a week.   valsartan-hydrochlorothiazide (DIOVAN-HCT) 320-12.5 MG tablet TAKE 1 TABLET BY MOUTH DAILY   Vitamin D, Ergocalciferol, (DRISDOL) 1.25 MG (50000 UNIT) CAPS capsule Take 1 capsule (50,000 Units total) by mouth 3 (three) times a week.   vortioxetine HBr (TRINTELLIX) 10 MG TABS tablet Take 1 tablet (10 mg total) by mouth daily.   White Petrolatum-Mineral Oil (SYSTANE NIGHTTIME) OINT Apply 1 application to eye at bedtime.   zolpidem (AMBIEN CR) 12.5 MG CR tablet TAKE 1 TABLET(12.5 MG) BY MOUTH AT BEDTIME  AS NEEDED FOR SLEEP   No facility-administered encounter medications on file as of 01/02/2021.   DElray Mcgregor CYazooPharmacist Assistant  [phone]

## 2021-01-03 ENCOUNTER — Telehealth: Payer: Self-pay

## 2021-01-03 NOTE — Progress Notes (Signed)
I called pt and she stated she needs her Ozempic 2023 PAP mailed to her. She has the [employer] but needs her other application. I have uploaded this to be mailed to pt.  Elray Mcgregor, [place] Pharmacist Assistant  [phone]

## 2021-01-11 DIAGNOSIS — E890 Postprocedural hypothyroidism: Secondary | ICD-10-CM | POA: Diagnosis not present

## 2021-01-22 ENCOUNTER — Ambulatory Visit: Payer: Medicare HMO | Admitting: Family Medicine

## 2021-01-23 DIAGNOSIS — E890 Postprocedural hypothyroidism: Secondary | ICD-10-CM | POA: Diagnosis not present

## 2021-01-23 DIAGNOSIS — E669 Obesity, unspecified: Secondary | ICD-10-CM | POA: Diagnosis not present

## 2021-01-23 DIAGNOSIS — Z6841 Body Mass Index (BMI) 40.0 and over, adult: Secondary | ICD-10-CM | POA: Diagnosis not present

## 2021-01-23 DIAGNOSIS — Z79890 Hormone replacement therapy (postmenopausal): Secondary | ICD-10-CM | POA: Diagnosis not present

## 2021-01-23 DIAGNOSIS — I10 Essential (primary) hypertension: Secondary | ICD-10-CM | POA: Diagnosis not present

## 2021-01-23 DIAGNOSIS — Z7985 Long-term (current) use of injectable non-insulin antidiabetic drugs: Secondary | ICD-10-CM | POA: Diagnosis not present

## 2021-01-31 ENCOUNTER — Other Ambulatory Visit: Payer: Self-pay | Admitting: Family Medicine

## 2021-02-18 ENCOUNTER — Other Ambulatory Visit: Payer: Self-pay

## 2021-02-18 ENCOUNTER — Ambulatory Visit (INDEPENDENT_AMBULATORY_CARE_PROVIDER_SITE_OTHER): Payer: Medicare HMO

## 2021-02-18 DIAGNOSIS — I10 Essential (primary) hypertension: Secondary | ICD-10-CM

## 2021-02-18 DIAGNOSIS — E782 Mixed hyperlipidemia: Secondary | ICD-10-CM

## 2021-02-18 DIAGNOSIS — R7303 Prediabetes: Secondary | ICD-10-CM

## 2021-02-18 NOTE — Progress Notes (Signed)
Chronic Care Management Pharmacy Note  02/18/2021 Name:  Donna Mayer MRN:  [id number] DOB:  1955-09-23  Summary: Patient onboard to [facility], sent msg to [employer] Patient not taking Clonidine BID, only taking HS. Will ask PCP if this is OK and if so, to send new script to [facility] for HS. Patient has appt with PCP on 02/20/21 Ergocalciferol written 3/week but patient only taking 1/week. Will ask PCP if ok to change to 1/week or if should be on 3/week. Will ask PCP to send in updated script either way  Subjective: Donna Mayer is an 66 y.o. year old female who is a primary patient of Cox, Kirsten, MD.  The CCM team was consulted for assistance with disease management and care coordination needs.    Engaged with patient by telephone for follow up visit in response to provider referral for pharmacy case management and/or care coordination services.   Consent to Services:  The patient was given information about Chronic Care Management services, agreed to services, and gave verbal consent prior to initiation of services.  Please see initial visit note for detailed documentation.   Patient Care Team: [name], MD as PCP - General (Family Medicine) Darrold Junker, FNP (Endocrinology) Wallene Huh, MD as Referring Physician (Endocrinology) Melida Quitter, MD as Consulting Physician (Otolaryngology) Lane Hacker, [facility] (Pharmacist)  Recent office visits:  04/17/20-Mammogram scheduled for 05/07/20   04/05/20-Dr. Cox PCP, Valsartan 165m, Stopped Propranolol, start back Lipitor 229m follow up 55m955molood count normal. Liver function normal. Kidney function normal. Synthroid level is getting closer to normal. Recommend decrease synthroid to 100 mcg daily in am. 30/1. Recheck tsh in 6 weeks.Cholesterol: LDL 133. Worsened since 4 months ago. Recommend start on lipitor 20 mg once daily.HBA1C: 5.3. Vitamin D 17. Recommend vitamin D 50000 U three times a week.      Recent consult visits:   06/06/2020 - Endocrinology - completed 8 week of high dose vitamin D. Continue vitamin D 2000 units daily. Change thyroid levothyroxine 112 mcg daily.    Hospital visits:  None in previous 6 months   Objective:  Lab Results  Component Value Date   CREATININE 0.76 10/16/2020   BUN 18 10/16/2020   GFRNONAA 96 02/16/2020   GFRAA 111 02/16/2020   NA 138 10/16/2020   K 4.3 10/16/2020   CALCIUM 10.1 10/16/2020   CO2 25 10/16/2020   GLUCOSE 86 10/16/2020    Lab Results  Component Value Date/Time   HGBA1C 5.6 10/16/2020 07:57 AM   HGBA1C 5.8 (H) 07/13/2020 08:49 AM    Last diabetic Eye exam: No results found for: HMDIABEYEEXA  Last diabetic Foot exam: No results found for: HMDIABFOOTEX   Lab Results  Component Value Date   CHOL 176 10/16/2020   HDL 85 10/16/2020   LDLCALC 78 10/16/2020   TRIG 72 10/16/2020   CHOLHDL 2.1 10/16/2020    Hepatic Function Latest Ref Rng & Units 10/16/2020 07/13/2020 04/06/2020  Total Protein 6.0 - 8.5 g/dL 8.2 7.6 8.1  Albumin 3.8 - 4.8 g/dL 4.6 4.7 4.6  AST 0 - 40 IU/L 17 20 25   ALT 0 - 32 IU/L 12 14 16   Alk Phosphatase 44 - 121 IU/L 114 128(H) 146(H)  Total Bilirubin 0.0 - 1.2 mg/dL 0.4 0.9 0.4    Lab Results  Component Value Date/Time   TSH 2.230 05/22/2020 04:06 PM   TSH 0.187 (L) 04/06/2020 11:34 AM   FREET4 1.61 03/07/2020 03:41 PM   FREET4 2.25 (  H) 02/16/2020 03:35 PM    CBC Latest Ref Rng & Units 10/16/2020 07/13/2020 04/06/2020  WBC 3.4 - 10.8 x10E3/uL 6.1 5.1 6.5  Hemoglobin 11.1 - 15.9 g/dL 11.6 11.5 12.0  Hematocrit 34.0 - 46.6 % 35.4 35.6 38.2  Platelets 150 - 450 x10E3/uL 258 230 227    Lab Results  Component Value Date/Time   VD25OH 20.1 (L) 07/13/2020 08:49 AM   VD25OH 17.7 (L) 04/06/2020 11:34 AM    Clinical ASCVD: No  The 10-year ASCVD risk score (Arnett DK, et al., 2019) is: 6.7%   Values used to calculate the score:     Age: 54 years     Sex: Female     Is Non-Hispanic African American: Yes     Diabetic: No      Tobacco smoker: No     Systolic Blood Pressure: 053 mmHg     Is BP treated: Yes     HDL Cholesterol: 85 mg/dL     Total Cholesterol: 176 mg/dL    Depression screen [facility] 2/9 10/16/2020 11/30/2019 11/28/2019  Decreased Interest 0 2 0  Down, Depressed, Hopeless 0 0 0  PHQ - 2 Score 0 2 0  Altered sleeping - 2 -  Tired, decreased energy - 3 -  Change in appetite - 1 -  Feeling bad or failure about yourself  - 0 -  Trouble concentrating - 1 -  Moving slowly or fidgety/restless - 0 -  Suicidal thoughts - 0 -  PHQ-9 Score - 9 -  Difficult doing work/chores - Somewhat difficult -     Other: (CHADS2VASc if Afib, MMRC or CAT for COPD, ACT, DEXA)  Social History   Tobacco Use  Smoking Status Never  Smokeless Tobacco Never   BP Readings from Last 3 Encounters:  10/16/20 132/84  08/13/20 (!) 150/88  07/13/20 (!) 158/100   Pulse Readings from Last 3 Encounters:  10/16/20 71  08/13/20 72  07/13/20 76   Wt Readings from Last 3 Encounters:  10/16/20 246 lb 12.8 oz (111.9 kg)  08/13/20 246 lb (111.6 kg)  07/13/20 242 lb (109.8 kg)   BMI Readings from Last 3 Encounters:  10/16/20 45.14 kg/m  08/13/20 44.99 kg/m  07/13/20 44.26 kg/m    Assessment/Interventions: Review of patient past medical history, allergies, medications, health status, including review of consultants reports, laboratory and other test data, was performed as part of comprehensive evaluation and provision of chronic care management services.   SDOH:  (Social Determinants of Health) assessments and interventions performed: Yes  SDOH Screenings   Alcohol Screen: Not on file  Depression (PHQ2-9): Low Risk    PHQ-2 Score: 0  Financial Resource Strain: Not on file  Food Insecurity: Not on file  Housing: Not on file  Physical Activity: Not on file  Social Connections: Not on file  Stress: Not on file  Tobacco Use: Low Risk    Smoking Tobacco Use: Never   Smokeless Tobacco Use: Never   Passive Exposure:  Not on file  Transportation Needs: Not on file    [place]  No Known Allergies  Medications Reviewed Today     Reviewed by Lane Hacker, [facility] (Pharmacist) on 02/18/21 at 30  Med List Status: <None>   Medication Order Taking? Sig Documenting Provider Last Dose Status Informant  amLODipine (NORVASC) 10 MG tablet [id number] Yes Take 1 tablet by mouth daily. [provider] Taking Active   atorvastatin (LIPITOR) 40 MG tablet [id number] Yes TAKE 1 TABLET(40 MG)  BY MOUTH DAILY Cox, Kirsten, MD Taking Active   cloNIDine (CATAPRES) 0.1 MG tablet [id number] No TAKE 1 TABLET TWICE DAILY  Patient not taking: Reported on 02/18/2021   CoxElnita Maxwell, MD Not Taking Active   ferrous sulfate 325 (65 FE) MG tablet [id number]  Take 325 mg by mouth daily with breakfast. [provider]  Active Self  levothyroxine (SYNTHROID) 112 MCG tablet [id number] Yes Take 1 tablet by mouth daily. [provider] Taking Active   magnesium oxide (MAG-OX) 400 MG tablet [id number]  Take 400 mg by mouth daily. [provider]  Active   NEXIUM 40 MG capsule [id number]  Take twice a day by mouth.  Patient taking differently: Take 40 mg by mouth daily at 12 noon.   Cox, Kirsten, MD  Active   Polyethyl Glycol-Propyl Glycol (SYSTANE) 0.4-0.3 % GEL ophthalmic gel [id number]  Place 1 application into both eyes every 4 (four) hours as needed. Cox, Kirsten, MD  Active Self  Semaglutide, 1 MG/DOSE, (OZEMPIC, 1 MG/DOSE,) 4 MG/3ML SOPN [id number] Yes Inject 1 mg into the skin once a week. Cox, Elnita Maxwell, MD Taking Active   valsartan-hydrochlorothiazide (DIOVAN-HCT) 320-12.5 MG tablet [id number] Yes TAKE 1 TABLET BY MOUTH DAILY Cox, Kirsten, MD Taking Active   Vitamin D, Ergocalciferol, (DRISDOL) 1.25 MG (50000 UNIT) CAPS capsule [id number]  Take 1 capsule (50,000 Units total) by mouth 3 (three) times a week. Cox, Kirsten, MD  Active   vortioxetine HBr (TRINTELLIX) 10 MG TABS tablet [id number] Yes Take 1  tablet (10 mg total) by mouth daily. Cox, Kirsten, MD Taking Active   White Petrolatum-Mineral Oil (SYSTANE NIGHTTIME) [employer] [id number]  Apply 1 application to eye at bedtime. Cox, Kirsten, MD  Active Self  zolpidem (AMBIEN CR) 12.5 MG CR tablet [id number] Yes TAKE 1 TABLET(12.5 MG) BY MOUTH AT BEDTIME AS NEEDED FOR SLEEP Cox, Kirsten, MD Taking Active             Patient Active Problem List   Diagnosis Date Noted   Vitamin D insufficiency 10/16/2020   Thoracic aortic aneurysm, without rupture 08/02/2020   Obesity, Class III, BMI 40-49.9 (morbid obesity) ([place]) 06/06/2020   Post-surgical hypothyroidism 01/16/2020   CHF (congestive heart failure) (HCC)    Other insomnia    IDA (iron deficiency anemia)    GERD (gastroesophageal reflux disease)    Chronic pain    Cardiac murmur    Depression, major, recurrent, mild ([place]) 05/24/2019   Mixed hyperlipidemia    Essential hypertension, benign 05/13/2019    Immunization History  Administered Date(s) Administered   Influenza Inj Mdck Quad Pf 11/10/2019, 10/16/2020   Influenza Split 11/10/2019   Moderna SARS-COV2 Booster Vaccination 12/20/2019, 07/13/2020   Moderna Sars-Covid-2 Vaccination 03/17/2019, 04/15/2019   Pneumococcal Polysaccharide-23 10/25/2014    Conditions to be addressed/monitored:  Hypertension, Hyperlipidemia, and Hypothyroidism  Care Plan : [place]  Updates made by Lane Hacker, [place] since 02/18/2021 12:00 AM     Problem: hypothyroidism, hypertension, hyperlipidemia   Priority: High  Onset Date: 07/07/2020     Long-Range Goal: disease state management   Start Date: 07/07/2020  Expected End Date: 07/07/2021  Recent Progress: On track  Priority: High  Note:    Current Barriers:  Unable to independently afford treatment regimen  Pharmacist Clinical Goal(s):  Patient will verbalize ability to afford treatment regimen through collaboration with PharmD and provider.   Interventions: 1:1  collaboration with [name], MD regarding development and update of comprehensive plan of care as evidenced  by provider attestation and co-signature Inter-disciplinary care team collaboration (see longitudinal plan of care) Comprehensive medication review performed; medication list updated in electronic medical record  Hypertension (BP goal <140/90) BP Readings from Last 3 Encounters:  10/16/20 132/84  08/13/20 (!) 150/88  07/13/20 (!) 158/100  -Not ideally controlled -Current treatment: Clonidine 0.1 mg bid (Patient only taking HS) Query Appropriate,  Amlodipine 22m Appropriate, Effective, Safe, Accessible Valsartan/HCTZ 320/12.5 Appropriate, Effective, Safe, Accessible -Medications previously tried: atenolol, diltiazem, irbesartan-hydrochlorothiazide -Current home readings: well controlled per patient  -Current dietary habits: eating very healthy - salads, lean meats -[facility] -Denies hypotensive/hypertensive symptoms -Educated on BP goals and benefits of medications for prevention of heart attack, stroke and kidney damage; Daily salt intake goal < 2300 mg; Exercise goal of 150 minutes per week; -Counseled to monitor BP at home as needed, document, and provide log at future appointments -Counseled on diet and exercise extensively Recommended to continue current medication  Hyperlipidemia: (LDL goal < 100) The 10-year ASCVD risk score (Arnett DK, et al., 2019) is: 6.7%   Values used to calculate the score:     Age: 89years     Sex: Female     Is Non-Hispanic African American: Yes     Diabetic: No     Tobacco smoker: No     Systolic Blood Pressure: 1323mmHg     Is BP treated: Yes     HDL Cholesterol: 85 mg/dL     Total Cholesterol: 176 mg/dL Lab Results  Component Value Date   CHOL 176 10/16/2020   CHOL 231 (H) 07/13/2020   CHOL 268 (H) 04/06/2020   Lab Results  Component Value Date   HDL 85 10/16/2020   HDL 96  07/13/2020   HDL 120 04/06/2020   Lab Results  Component Value Date   LDLCALC 78 10/16/2020   LDLCALC 124 (H) 07/13/2020   LDLCALC 133 (H) 04/06/2020   Lab Results  Component Value Date   TRIG 72 10/16/2020   TRIG 64 07/13/2020   TRIG 93 04/06/2020   Lab Results  Component Value Date   CHOLHDL 2.1 10/16/2020   CHOLHDL 2.4 07/13/2020   CHOLHDL 2.2 04/06/2020  No results found for: LDLDIRECT -Uncontrolled with last labs -Current treatment: Atorvastatin 40 mg daily Appropriate, Effective, Safe, Accessible -Medications previously tried: rosuvastatin   -Current dietary patterns: eating very healthy - fruit, vegetables and lean protein -[facility] habits[facility] -Educated on Cholesterol goals;  Benefits of statin for ASCVD risk reduction; Importance of limiting foods high in cholesterol; Exercise goal of 150 minutes per week; -Counseled on diet and exercise extensively Recommended to continue current medication  Thyroid (Goal TSH: 0.4-5.0) Lab Results  Component Value Date   TSH 2.230 05/22/2020   T3TOTAL 102 11/16/2019  -Controlled -Current treatment: Levothyroxine 1163m Appropriate, Effective, Safe, Accessible -Counseled to take medication on an empty stomach -Recommended to continue current medication  Diabetes (A1c goal <6.5%) Lab Results  Component Value Date   HGBA1C 5.6 10/16/2020   HGBA1C 5.8 (H) 07/13/2020   HGBA1C 5.3 04/06/2020   Lab Results  Component Value Date   LDLCALC 78 10/16/2020   CREATININE 0.76 10/16/2020   Lab Results  Component Value Date   NA 138 10/16/2020   K 4.3 10/16/2020   CREATININE 0.76 10/16/2020   EGFR 88 10/16/2020   GFRNONAA 96 02/16/2020   GLUCOSE 86 10/16/2020   Lab Results  Component Value Date  WBC 6.1 10/16/2020   HGB 11.6 10/16/2020   HCT 35.4 10/16/2020   MCV 81 10/16/2020   PLT 258 10/16/2020  -Controlled -Current medications: Ozempic 31m/week (PAP) Appropriate,  Effective, Safe, Query accessible -Medications previously tried: N/A  -Current home glucose readings fasting glucose: Doesn't test post prandial glucose: Doesn't test -Denies hypoglycemic/hyperglycemic symptoms -Current meal patterns: Patient wasn't interested in talking, gave closed ended answers to open ended questions -[facility] [facility] [place] -Educated on A1c and blood sugar goals; -Counseled to check feet daily and get yearly eye exams Feb 2023: Ozempic PAP not approved yet, patient will call and see status. Will call me back if something missing from form  Depression/Anxiety -Controlled -Current treatment: Trintellix (PAP) Appropriate, Effective, Safe, Accessible -Medications previously tried/failed:  -PHQ9:  Depression screen [facility]2/9 10/16/2020 11/30/2019 11/28/2019  Decreased Interest 0 2 0  Down, Depressed, Hopeless 0 0 0  PHQ - 2 Score 0 2 0  Altered sleeping - 2 -  Tired, decreased energy - 3 -  Change in appetite - 1 -  Feeling bad or failure about yourself  - 0 -  Trouble concentrating - 1 -  Moving slowly or fidgety/restless - 0 -  Suicidal thoughts - 0 -  PHQ-9 Score - 9 -  Difficult doing work/chores - Somewhat difficult -  -GAD7: No flowsheet data found. -Educated on Benefits of medication for symptom control Feb 2023: Would like to do a PHQ9 today but patient didn't seem interested in talking. Will build relationship and re-assess later. Has in person visit with PCP this week and can do PHQ9 then  Insomnia (Goal: 7-8 hours of sleep/night) -Controlled -Patient currently getting 8 hours of sleep/night -Patient currently waking up 0 times/night -Current treatment: Zolpidem 12.552mCR (Using 3-5x/week) Appropriate, Effective, Safe, Accessible -Medications previously tried: N/A -Counseled on sleep hygiene techniques (consistent sleep/wake up schedule, no electronic screens 1-2 hours before bed, etc) Feb 2023: Priority was building  relationship, will talk about Sleep Habits at future visits. Patient didn't seem interested in me calling and even stated she was "Tired of all these calls"    Health Maintenance -Vaccine gaps: tdap, shingles and covid booster  -Counseled on diet and exercise extensively Recommended to continue current medication  Patient Goals/Self-Care Activities Patient will:  - take medications as prescribed target a minimum of 150 minutes of moderate intensity exercise weekly engage in dietary modifications by continuing healthy habits of lean protein, vegetables and fruit  Follow Up Plan: Telephone follow up appointment with care management team member scheduled for: October 2023  [place] ConstablePharm.D. - [phone]       Medication Assistance:  Trintellix obtained through   medication assistance program.  Enrollment ends 01/12/2021  Compliance/Adherence/Medication fill history: Care Gaps: AWV needed  Star-Rating Drugs: Valsartan  Atorvastatin    Patient's preferred pharmacy is:  [facility] [place]hone: [phone] 2002ax: [phone] Delivery - [place]hone: [phone]: [phone] [address]hone: [phone]: [phone] pill box? No - has a good system and denies missed doses Pt endorses 100% compliance  We discussed: Benefits of medication synchronization, packaging and delivery as well as enhanced pharmacist oversight with [facility]. Patient decided to: Utilize [facility] pharmacy for medication synchronization, packaging and delivery Verbal consent obtained for [facility] enhanced pharmacy  services (medication synchronization, adherence packaging, delivery coordination). A medication sync plan was created to allow patient to get all medications delivered once every 30 to 90 days per patient preference. Patient understands they have freedom to choose pharmacy and clinical pharmacist will coordinate care between all prescribers and [facility].  Medication Name                        (please note if Rx is PRN) Prescriber                                                                  (list Provider Name & Phone Number)                                  Timing    Refill Timing Last Fill Date & DS       (if last fill/DS unavailable, list pt.'s quantity on hand) Anticipated next due date    BB B L EM BT   Days Supply             01/00/00  Zolpidem 12.23m Cox -[phone]    1 Cycle Fill 02/01/2021 30.00 03/03/21  Atorvastatin 454mCox -[phone]   1 Cycle Fill 11/26/2020 90.00 02/25/21  Clonidine 0.42m20mS Cox -[phone]  1 Cycle Fill unknown unknown 03/25/21  Amlodpine 35m53mx -[phone]   Cycle Fill 09/24/2020 90.00 02/25/21  Valsartan/HCTZ 320-12.5 Cox -[phone]   Cycle Fill 01/31/2021 90.00 05/01/21  Levothyroxine 112 Cox -[phone]   Cycle Fill 11/29/2020 90.00 02/25/2021  Vitamin D, Ergocalciferol take 3/week Cox -[phone]   Cycle Fill 02/01/2021 35.00 03/25/2021    Care Plan and Follow Up Patient Decision:  Patient agrees to Care Plan and Follow-up.  Plan: Telephone follow up appointment with care management team member scheduled for:  02/2020

## 2021-02-18 NOTE — Patient Instructions (Signed)
Visit Information   Goals Addressed   None    Patient Care Plan: CCM Pharmacy Care Plan     Problem Identified: hypothyroidism, hypertension, hyperlipidemia   Priority: High  Onset Date: 07/07/2020     Long-Range Goal: disease state management   Start Date: 07/07/2020  Expected End Date: 07/07/2021  Recent Progress: On track  Priority: High  Note:    Current Barriers:  Unable to independently afford treatment regimen  Pharmacist Clinical Goal(s):  Patient will verbalize ability to afford treatment regimen through collaboration with PharmD and provider.   Interventions: 1:1 collaboration with Cox, Elnita Maxwell, MD regarding development and update of comprehensive plan of care as evidenced by provider attestation and co-signature Inter-disciplinary care team collaboration (see longitudinal plan of care) Comprehensive medication review performed; medication list updated in electronic medical record  Hypertension (BP goal <140/90) BP Readings from Last 3 Encounters:  10/16/20 132/84  08/13/20 (!) 150/88  07/13/20 (!) 158/100  -Not ideally controlled -Current treatment: Clonidine 0.1 mg bid (Patient only taking HS) Query Appropriate,  Amlodipine 22m Appropriate, Effective, Safe, Accessible Valsartan/HCTZ 320/12.5 Appropriate, Effective, Safe, Accessible -Medications previously tried: atenolol, diltiazem, irbesartan-hydrochlorothiazide -Current home readings: well controlled per patient  -Current dietary habits: eating very healthy - salads, lean meats -[facility] -Denies hypotensive/hypertensive symptoms -Educated on BP goals and benefits of medications for prevention of heart attack, stroke and kidney damage; Daily salt intake goal < 2300 mg; Exercise goal of 150 minutes per week; -Counseled to monitor BP at home as needed, document, and provide log at future appointments -Counseled on diet and exercise extensively Recommended to  continue current medication  Hyperlipidemia: (LDL goal < 100) The 10-year ASCVD risk score (Arnett DK, et al., 2019) is: 6.7%   Values used to calculate the score:     Age: [age]years     Sex: Female     Is Non-Hispanic African American: Yes     Diabetic: No     Tobacco smoker: No     Systolic Blood Pressure: 1254mmHg     Is BP treated: Yes     HDL Cholesterol: 85 mg/dL     Total Cholesterol: 176 mg/dL Lab Results  Component Value Date   CHOL 176 10/16/2020   CHOL 231 (H) 07/13/2020   CHOL 268 (H) 04/06/2020   Lab Results  Component Value Date   HDL 85 10/16/2020   HDL 96 07/13/2020   HDL 120 04/06/2020   Lab Results  Component Value Date   LDLCALC 78 10/16/2020   LDLCALC 124 (H) 07/13/2020   LDLCALC 133 (H) 04/06/2020   Lab Results  Component Value Date   TRIG 72 10/16/2020   TRIG 64 07/13/2020   TRIG 93 04/06/2020   Lab Results  Component Value Date   CHOLHDL 2.1 10/16/2020   CHOLHDL 2.4 07/13/2020   CHOLHDL 2.2 04/06/2020  No results found for: LDLDIRECT -Uncontrolled with last labs -Current treatment: Atorvastatin 40 mg daily Appropriate, Effective, Safe, Accessible -Medications previously tried: rosuvastatin   -Current dietary patterns: eating very healthy - fruit, vegetables and lean protein -[facility] -Educated on Cholesterol goals;  Benefits of statin for ASCVD risk reduction; Importance of limiting foods high in cholesterol; Exercise goal of 150 minutes per week; -Counseled on diet and exercise extensively Recommended to continue current medication  Thyroid (Goal TSH: 0.4-5.0) Lab Results  Component Value Date   TSH 2.230 05/22/2020   T3TOTAL 102  11/16/2019  -Controlled -Current treatment: Levothyroxine 163mg Appropriate, Effective, Safe, Accessible -Counseled to take medication on an empty stomach -Recommended to continue current medication  Diabetes (A1c goal <6.5%) Lab Results   Component Value Date   HGBA1C 5.6 10/16/2020   HGBA1C 5.8 (H) 07/13/2020   HGBA1C 5.3 04/06/2020   Lab Results  Component Value Date   LDLCALC 78 10/16/2020   CREATININE 0.76 10/16/2020   Lab Results  Component Value Date   NA 138 10/16/2020   K 4.3 10/16/2020   CREATININE 0.76 10/16/2020   EGFR 88 10/16/2020   GFRNONAA 96 02/16/2020   GLUCOSE 86 10/16/2020   Lab Results  Component Value Date   WBC 6.1 10/16/2020   HGB 11.6 10/16/2020   HCT 35.4 10/16/2020   MCV 81 10/16/2020   PLT 258 10/16/2020  -Controlled -Current medications: Ozempic 146mweek (PAP) Appropriate, Effective, Safe, Query accessible -Medications previously tried: N/A  -Current home glucose readings fasting glucose: Doesn't test post prandial glucose: Doesn't test -Denies hypoglycemic/hyperglycemic symptoms -Current meal patterns: Patient wasn't interested in talking, gave closed ended answers to open ended questions -[facility] [facility] [place] -Educated on A1c and blood sugar goals; -Counseled to check feet daily and get yearly eye exams Feb 2023: Ozempic PAP not approved yet, patient will call and see status. Will call me back if something missing from form  Depression/Anxiety -Controlled -Current treatment: Trintellix (PAP) Appropriate, Effective, Safe, Accessible -Medications previously tried/failed:  -PHQ9:  [facility] 10/16/2020 11/30/2019 11/28/2019  Decreased Interest 0 2 0  Down, Depressed, Hopeless 0 0 0  PHQ - 2 Score 0 2 0  Altered sleeping - 2 -  Tired, decreased energy - 3 -  Change in appetite - 1 -  Feeling bad or failure about yourself  - 0 -  Trouble concentrating - 1 -  Moving slowly or fidgety/restless - 0 -  Suicidal thoughts - 0 -  PHQ-9 Score - 9 -  Difficult doing work/chores - Somewhat difficult -  -GAD7: No flowsheet data found. -Educated on Benefits of medication for symptom control Feb 2023: Would like to do a PHQ9 today  but patient didn't seem interested in talking. Will build relationship and re-assess later. Has in person visit with PCP this week and can do PHQ9 then  Insomnia (Goal: 7-8 hours of sleep/night) -Controlled -Patient currently getting 8 hours of sleep/night -Patient currently waking up 0 times/night -Current treatment: Zolpidem 12.46m7mR (Using 3-5x/week) Appropriate, Effective, Safe, Accessible -Medications previously tried: N/A -Counseled on sleep hygiene techniques (consistent sleep/wake up schedule, no electronic screens 1-2 hours before bed, etc) Feb 2023: Priority was building relationship, will talk about Sleep Habits at future visits. Patient didn't seem interested in me calling and even stated she was "Tired of all these calls"    Health Maintenance -Vaccine gaps: tdap, shingles and covid booster  -Counseled on diet and exercise extensively Recommended to continue current medication  Patient Goals/Self-Care Activities Patient will:  - take medications as prescribed target a minimum of 150 minutes of moderate intensity exercise weekly engage in dietary modifications by continuing healthy habits of lean protein, vegetables and fruit  Follow Up Plan: Telephone follow up appointment with care management team member scheduled for: October 2023  NatArizona Constableharm.D. - 3- [phone]   Ms. ColVentrellas given information about Chronic Care Management services today including:  CCM service includes personalized support from designated clinical staff supervised by her physician, including  individualized plan of care and coordination with other care providers 24/7 contact phone numbers for assistance for urgent and routine care needs. Standard insurance, coinsurance, copays and deductibles apply for chronic care management only during months in which we provide at least 20 minutes of these services. Most insurances cover these services at 100%, however patients may be responsible  for any copay, coinsurance and/or deductible if applicable. This service may help you avoid the need for more expensive face-to-face services. Only one practitioner may furnish and bill the service in a calendar month. The patient may stop CCM services at any time (effective at the end of the month) by phone call to the office staff.  Patient agreed to services and verbal consent obtained.   The patient verbalized understanding of instructions, educational materials, and care plan provided today and declined offer to receive copy of patient instructions, educational materials, and care plan.  The pharmacy team will reach out to the patient again over the next 60 days.   Lane Hacker, [place]

## 2021-02-20 ENCOUNTER — Other Ambulatory Visit: Payer: Self-pay

## 2021-02-20 ENCOUNTER — Ambulatory Visit (INDEPENDENT_AMBULATORY_CARE_PROVIDER_SITE_OTHER): Payer: Medicare HMO | Admitting: Family Medicine

## 2021-02-20 ENCOUNTER — Ambulatory Visit (INDEPENDENT_AMBULATORY_CARE_PROVIDER_SITE_OTHER): Payer: Medicare HMO

## 2021-02-20 ENCOUNTER — Encounter: Payer: Self-pay | Admitting: Family Medicine

## 2021-02-20 VITALS — BP 136/84 | HR 110 | Temp 96.5°F | Resp 18 | Ht 62.5 in | Wt 247.0 lb

## 2021-02-20 DIAGNOSIS — K219 Gastro-esophageal reflux disease without esophagitis: Secondary | ICD-10-CM

## 2021-02-20 DIAGNOSIS — R7301 Impaired fasting glucose: Secondary | ICD-10-CM | POA: Diagnosis not present

## 2021-02-20 DIAGNOSIS — I7121 Aneurysm of the ascending aorta, without rupture: Secondary | ICD-10-CM | POA: Diagnosis not present

## 2021-02-20 DIAGNOSIS — D508 Other iron deficiency anemias: Secondary | ICD-10-CM | POA: Diagnosis not present

## 2021-02-20 DIAGNOSIS — Z23 Encounter for immunization: Secondary | ICD-10-CM

## 2021-02-20 DIAGNOSIS — E559 Vitamin D deficiency, unspecified: Secondary | ICD-10-CM

## 2021-02-20 DIAGNOSIS — F330 Major depressive disorder, recurrent, mild: Secondary | ICD-10-CM

## 2021-02-20 DIAGNOSIS — I10 Essential (primary) hypertension: Secondary | ICD-10-CM

## 2021-02-20 DIAGNOSIS — R7303 Prediabetes: Secondary | ICD-10-CM | POA: Insufficient documentation

## 2021-02-20 DIAGNOSIS — Z1231 Encounter for screening mammogram for malignant neoplasm of breast: Secondary | ICD-10-CM

## 2021-02-20 HISTORY — DX: Prediabetes: R73.03

## 2021-02-20 HISTORY — DX: Encounter for screening mammogram for malignant neoplasm of breast: Z12.31

## 2021-02-20 MED ORDER — LEVOTHYROXINE SODIUM 112 MCG PO TABS: 112.0000 ug | ORAL_TABLET | Freq: Every day | ORAL | 1 refills | Status: DC

## 2021-02-20 NOTE — Assessment & Plan Note (Signed)
Continue iron supplement.

## 2021-02-20 NOTE — Assessment & Plan Note (Signed)
The current medical regimen is effective;  continue present plan and medications. Continue trintellix. ON PAP.

## 2021-02-20 NOTE — Progress Notes (Signed)
Coordinated with team to increase Ozempic dose to 17m/week per msg from Dr. CTobie Poet

## 2021-02-20 NOTE — Assessment & Plan Note (Signed)
Check level. Determine appropriate dose

## 2021-02-20 NOTE — Assessment & Plan Note (Signed)
Recommend continue to work on eating healthy diet and exercise. Continue ozempic 2 mg weekly.
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
## 2021-02-20 NOTE — Assessment & Plan Note (Signed)
Unclear where imagine was for this diagnosis.  I will have staff search [facility]. May need repeat cta of chest.

## 2021-02-20 NOTE — Assessment & Plan Note (Signed)
Continue amlodipine 10 mg daily and diovan hct 320/12.5 mg daily. Hold clonidine. Check bp daily. If bp increases above 140/90, patient should call.

## 2021-02-20 NOTE — Assessment & Plan Note (Signed)
Recommend continue to work on eating healthy diet and exercise. Hopefully, ozempic 2 mg weekly helps.

## 2021-02-20 NOTE — Assessment & Plan Note (Signed)
>>  ASSESSMENT AND PLAN FOR IMPAIRED FASTING GLUCOSE WRITTEN ON 02/20/2021  8:21 PM BY COX, KIRSTEN, MD  Recommend continue to work on eating healthy diet and exercise. Continue ozempic 2 mg weekly.

## 2021-02-21 LAB — CBC WITH DIFFERENTIAL/PLATELET
Basophils Absolute: 0 10*3/uL (ref 0.0–0.2)
Basos: 1 %
EOS (ABSOLUTE): 0.1 10*3/uL (ref 0.0–0.4)
Eos: 1 %
Hematocrit: 34.1 % (ref 34.0–46.6)
Hemoglobin: 11 g/dL — ABNORMAL LOW (ref 11.1–15.9)
Immature Grans (Abs): 0 10*3/uL (ref 0.0–0.1)
Immature Granulocytes: 0 %
Lymphocytes Absolute: 1.6 10*3/uL (ref 0.7–3.1)
Lymphs: 25 %
MCH: 25.2 pg — ABNORMAL LOW (ref 26.6–33.0)
MCHC: 32.3 g/dL (ref 31.5–35.7)
MCV: 78 fL — ABNORMAL LOW (ref 79–97)
Monocytes Absolute: 0.5 10*3/uL (ref 0.1–0.9)
Monocytes: 8 %
Neutrophils Absolute: 4.4 10*3/uL (ref 1.4–7.0)
Neutrophils: 65 %
Platelets: 280 10*3/uL (ref 150–450)
RBC: 4.36 x10E6/uL (ref 3.77–5.28)
RDW: 14.5 % (ref 11.7–15.4)
WBC: 6.7 10*3/uL (ref 3.4–10.8)

## 2021-02-21 LAB — COMPREHENSIVE METABOLIC PANEL
ALT: 11 IU/L (ref 0–32)
AST: 17 IU/L (ref 0–40)
Albumin/Globulin Ratio: 1.3 (ref 1.2–2.2)
Albumin: 4.3 g/dL (ref 3.8–4.8)
Alkaline Phosphatase: 120 IU/L (ref 44–121)
BUN/Creatinine Ratio: 22 (ref 12–28)
BUN: 17 mg/dL (ref 8–27)
Bilirubin Total: 0.4 mg/dL (ref 0.0–1.2)
CO2: 24 mmol/L (ref 20–29)
Calcium: 9.4 mg/dL (ref 8.7–10.3)
Chloride: 97 mmol/L (ref 96–106)
Creatinine, Ser: 0.78 mg/dL (ref 0.57–1.00)
Globulin, Total: 3.4 g/dL (ref 1.5–4.5)
Glucose: 82 mg/dL (ref 70–99)
Potassium: 4.1 mmol/L (ref 3.5–5.2)
Sodium: 137 mmol/L (ref 134–144)
Total Protein: 7.7 g/dL (ref 6.0–8.5)
eGFR: 85 mL/min/{1.73_m2} (ref >59)

## 2021-02-21 LAB — CARDIOVASCULAR RISK ASSESSMENT

## 2021-02-21 LAB — HEMOGLOBIN A1C
Est. average glucose Bld gHb Est-mCnc: 117 mg/dL
Hgb A1c MFr Bld: 5.7 % — ABNORMAL HIGH (ref 4.8–5.6)

## 2021-02-21 LAB — LIPID PANEL
Chol/HDL Ratio: 2.6 ratio (ref 0.0–4.4)
Cholesterol, Total: 202 mg/dL — ABNORMAL HIGH (ref 100–199)
HDL: 79 mg/dL (ref >39)
LDL Chol Calc (NIH): 110 mg/dL — ABNORMAL HIGH (ref 0–99)
Triglycerides: 71 mg/dL (ref 0–149)
VLDL Cholesterol Cal: 13 mg/dL (ref 5–40)

## 2021-02-21 LAB — VITAMIN D 25 HYDROXY (VIT D DEFICIENCY, FRACTURES): Vit D, 25-Hydroxy: 22.3 ng/mL — ABNORMAL LOW (ref 30.0–100.0)

## 2021-02-21 NOTE — Progress Notes (Signed)
Blood count abnormal. Mild anemia.  Liver function normal.  Kidney function normal.  Vitamin D level low. Recommend Increase vitamin D to 3 times per week.  Cholesterol: LDL little too high at 110. Recommend increase lipitor to 80 mg daily.  HBA1C: 5.7. Continue ozempic 2 mg weekly.

## 2021-02-22 ENCOUNTER — Other Ambulatory Visit: Payer: Self-pay

## 2021-02-22 MED ORDER — NEXIUM 40 MG PO CPDR: 40.0000 mg | DELAYED_RELEASE_CAPSULE | Freq: Every day | ORAL | 1 refills | Status: DC

## 2021-02-22 MED ORDER — VORTIOXETINE HBR 10 MG PO TABS: 10.0000 mg | ORAL_TABLET | Freq: Every day | ORAL | 1 refills | Status: DC

## 2021-02-22 MED ORDER — VITAMIN D (ERGOCALCIFEROL) 1.25 MG (50000 UNIT) PO CAPS: 50000.0000 [IU] | ORAL_CAPSULE | ORAL | 2 refills | Status: DC

## 2021-02-22 MED ORDER — VALSARTAN-HYDROCHLOROTHIAZIDE 320-12.5 MG PO TABS: 1.0000 | ORAL_TABLET | Freq: Every day | ORAL | 1 refills | Status: DC

## 2021-02-22 MED ORDER — AMLODIPINE BESYLATE 10 MG PO TABS: 10.0000 mg | ORAL_TABLET | Freq: Every day | ORAL | 1 refills | Status: DC

## 2021-02-22 MED ORDER — OZEMPIC (1 MG/DOSE) 4 MG/3ML ~~LOC~~ SOPN: 1.0000 mg | PEN_INJECTOR | SUBCUTANEOUS | 1 refills | Status: DC

## 2021-02-22 MED ORDER — ATORVASTATIN CALCIUM 80 MG PO TABS: ORAL_TABLET | ORAL | 1 refills | Status: DC

## 2021-02-22 MED ORDER — LEVOTHYROXINE SODIUM 112 MCG PO TABS: 112.0000 ug | ORAL_TABLET | Freq: Every day | ORAL | 1 refills | Status: DC

## 2021-02-22 MED ORDER — ZOLPIDEM TARTRATE ER 12.5 MG PO TBCR: EXTENDED_RELEASE_TABLET | ORAL | 5 refills | Status: DC

## 2021-02-25 ENCOUNTER — Other Ambulatory Visit: Payer: Self-pay | Admitting: Family Medicine

## 2021-02-25 DIAGNOSIS — Z1231 Encounter for screening mammogram for malignant neoplasm of breast: Secondary | ICD-10-CM

## 2021-03-05 ENCOUNTER — Other Ambulatory Visit: Payer: Self-pay

## 2021-03-05 MED ORDER — ZOLPIDEM TARTRATE ER 12.5 MG PO TBCR: EXTENDED_RELEASE_TABLET | ORAL | 5 refills | Status: DC

## 2021-03-06 ENCOUNTER — Telehealth: Payer: Self-pay

## 2021-03-06 NOTE — Chronic Care Management (AMB) (Signed)
° ° °  Chronic Care Management Pharmacy Assistant   Name: Donna Mayer  MRN: [id number] DOB: [birth year]  Reason for Encounter: Medication Review   03/06/2021-  Patient called in and [facility] regarding recent Ozempic delivery, medication was sent for 1 mg Ozempic and patient states she uses 2 mg of Ozempic once a weekly.   Checked chart per Dr Tobie Poet, patient is to continue with 2 mg Ozempic injection once a week but prescription sent in for Ozempic 60m once a week. Request sent to Clinical staff for new prescription to be sent to [facility] for Ozempic 2 mg. Received recommendation from NArizona Constable CPP of what patient should do with 1 mg injection. Per CPP patient can either use the 1 mg dosing up to 2 mg in 1 shot or can hold until she get corrected dose. Patient notified, she will use 143minjection on hand and dose up to 2 mg for once a week injection and [place] [facility] [place] [place] [facility] [facility].        Medications: Outpatient Encounter Medications as of 03/06/2021  Medication Sig   amLODipine (NORVASC) 10 MG tablet Take 1 tablet (10 mg total) by mouth daily.   atorvastatin (LIPITOR) 80 MG tablet TAKE 1 TABLET(40 MG) BY MOUTH DAILY   atorvastatin (LIPITOR) 80 MG tablet Take 80 mg by mouth daily.   ferrous sulfate 325 (65 FE) MG tablet Take 325 mg by mouth daily with breakfast.   levothyroxine (SYNTHROID) 112 MCG tablet Take 1 tablet (112 mcg total) by mouth daily.   magnesium oxide (MAG-OX) 400 MG tablet Take 400 mg by mouth daily.   NEXIUM 40 MG capsule Take 1 capsule (40 mg total) by mouth daily at 12 noon.   Polyethyl Glycol-Propyl Glycol (SYSTANE) 0.4-0.3 % GEL ophthalmic gel Place 1 application into both eyes every 4 (four) hours as needed.   Semaglutide, 1 MG/DOSE, (OZEMPIC, 1 MG/DOSE,) 4 MG/3ML SOPN Inject 1 mg into the skin once a week.   valsartan-hydrochlorothiazide (DIOVAN-HCT) 320-12.5 MG tablet Take 1 tablet by mouth daily.    Vitamin D, Ergocalciferol, (DRISDOL) 1.25 MG (50000 UNIT) CAPS capsule Take 1 capsule (50,000 Units total) by mouth 3 (three) times a week.   vortioxetine HBr (TRINTELLIX) 10 MG TABS tablet Take 1 tablet (10 mg total) by mouth daily.   White Petrolatum-Mineral Oil (SYSTANE NIGHTTIME) OINT Apply 1 application to eye at bedtime.   zolpidem (AMBIEN CR) 12.5 MG CR tablet Take 1 tablet by mouth at bedtime as needed   No facility-administered encounter medications on file as of 03/06/2021.    TaPattricia BossCMOstranderharmacist Assistant [phone]

## 2021-03-12 ENCOUNTER — Other Ambulatory Visit: Payer: Self-pay | Admitting: Family Medicine

## 2021-03-12 DIAGNOSIS — E785 Hyperlipidemia, unspecified: Secondary | ICD-10-CM

## 2021-03-12 DIAGNOSIS — I10 Essential (primary) hypertension: Secondary | ICD-10-CM | POA: Diagnosis not present

## 2021-03-12 MED ORDER — SEMAGLUTIDE (2 MG/DOSE) 8 MG/3ML ~~LOC~~ SOPN: 2.0000 mg | PEN_INJECTOR | SUBCUTANEOUS | 2 refills | Status: DC

## 2021-03-13 ENCOUNTER — Other Ambulatory Visit: Payer: Self-pay

## 2021-03-13 ENCOUNTER — Ambulatory Visit
Admission: RE | Admit: 2021-03-13 | Discharge: 2021-03-13 | Disposition: A | Discharge status: Home / Self Care | Payer: Medicare HMO | Source: Ambulatory Visit | Attending: Family Medicine | Admitting: Family Medicine

## 2021-03-13 DIAGNOSIS — Z1231 Encounter for screening mammogram for malignant neoplasm of breast: Secondary | ICD-10-CM

## 2021-03-15 ENCOUNTER — Other Ambulatory Visit: Payer: Self-pay | Admitting: Family Medicine

## 2021-03-15 DIAGNOSIS — R928 Other abnormal and inconclusive findings on diagnostic imaging of breast: Secondary | ICD-10-CM

## 2021-03-21 ENCOUNTER — Telehealth: Payer: Self-pay

## 2021-03-21 NOTE — Chronic Care Management (AMB) (Signed)
? ? ?  Chronic Care Management ?Pharmacy Assistant  ? ?Name: Donna Mayer  MRN: [id number] DOB: 17-Oct-1955 ? ?Reason for Encounter: Refill request ? ? ?Medications: ?Outpatient Encounter Medications as of 03/21/2021  ?Medication Sig  ? amLODipine (NORVASC) 10 MG tablet Take 1 tablet (10 mg total) by mouth daily.  ? atorvastatin (LIPITOR) 80 MG tablet TAKE 1 TABLET(40 MG) BY MOUTH DAILY  ? atorvastatin (LIPITOR) 80 MG tablet Take 80 mg by mouth daily.  ? ferrous sulfate 325 (65 FE) MG tablet Take 325 mg by mouth daily with breakfast.  ? levothyroxine (SYNTHROID) 112 MCG tablet Take 1 tablet (112 mcg total) by mouth daily.  ? magnesium oxide (MAG-OX) 400 MG tablet Take 400 mg by mouth daily.  ? NEXIUM 40 MG capsule Take 1 capsule (40 mg total) by mouth daily at 12 noon.  ? Polyethyl Glycol-Propyl Glycol (SYSTANE) 0.4-0.3 % GEL ophthalmic gel Place 1 application into both eyes every 4 (four) hours as needed.  ? Semaglutide, 2 MG/DOSE, 8 MG/3ML SOPN Inject 2 mg as directed once a week.  ? valsartan-hydrochlorothiazide (DIOVAN-HCT) 320-12.5 MG tablet Take 1 tablet by mouth daily.  ? Vitamin D, Ergocalciferol, (DRISDOL) 1.25 MG (50000 UNIT) CAPS capsule Take 1 capsule (50,000 Units total) by mouth 3 (three) times a week.  ? vortioxetine HBr (TRINTELLIX) 10 MG TABS tablet Take 1 tablet (10 mg total) by mouth daily.  ? White Petrolatum-Mineral Oil (SYSTANE NIGHTTIME) OINT Apply 1 application to eye at bedtime.  ? zolpidem (AMBIEN CR) 12.5 MG CR tablet Take 1 tablet by mouth at bedtime as needed  ? ?No facility-administered encounter medications on file as of 03/21/2021.  ? ?03-21-2021: Received a message from [place] from [facility] requesting a prescription for clonidine. Contacted patient and was informed that medication was discontinued per Dr. Tobie Poet and she needs Ozempic 2 mg instead of 1 mg next delivery. Updated Lauren. ? ?Jeannette How CMA ?Clinical Pharmacist Assistant ?[phone] ? ?

## 2021-03-27 ENCOUNTER — Ambulatory Visit (INDEPENDENT_AMBULATORY_CARE_PROVIDER_SITE_OTHER): Payer: Medicare HMO | Admitting: Nurse Practitioner

## 2021-03-27 DIAGNOSIS — Z0000 Encounter for general adult medical examination without abnormal findings: Secondary | ICD-10-CM

## 2021-03-27 NOTE — Progress Notes (Signed)
No show

## 2021-04-04 ENCOUNTER — Ambulatory Visit
Admission: RE | Admit: 2021-04-04 | Discharge: 2021-04-04 | Disposition: A | Discharge status: Home / Self Care | Payer: Medicare HMO | Source: Ambulatory Visit | Attending: Family Medicine | Admitting: Family Medicine

## 2021-04-04 ENCOUNTER — Other Ambulatory Visit: Payer: Self-pay | Admitting: Family Medicine

## 2021-04-04 DIAGNOSIS — R928 Other abnormal and inconclusive findings on diagnostic imaging of breast: Secondary | ICD-10-CM

## 2021-04-04 DIAGNOSIS — R922 Inconclusive mammogram: Secondary | ICD-10-CM | POA: Diagnosis not present

## 2021-04-18 ENCOUNTER — Telehealth: Payer: Self-pay

## 2021-04-18 NOTE — Progress Notes (Signed)
? ? ?  Chronic Care Management ?Pharmacy Assistant  ? ?Name: Donna Mayer  MRN: [id number] DOB: 05/16/1955 ? ? ?Reason for Encounter: Medication Coordination for Upstream  ?  ?Recent office visits:  ?03/27/21 Jerrell Belfast NP. Seen for Medicare Annual Wellness. No med changes ? ?03/12/21 [name] MD. Orders Only. Increased Semaglutide to 62m instead of 1 mg.  ? ?02/20/21 CRochel BromeMD. Seen for HTN. Recommend increase lipitor to 80 mg daily. Cholesterol: LDL little too high at 110.  ? ?Recent consult visits:  ?None ? ?Hospital visits:  ?None ? ?Medications: ?Outpatient Encounter Medications as of 04/18/2021  ?Medication Sig  ? amLODipine (NORVASC) 10 MG tablet Take 1 tablet (10 mg total) by mouth daily.  ? atorvastatin (LIPITOR) 80 MG tablet TAKE 1 TABLET(40 MG) BY MOUTH DAILY  ? atorvastatin (LIPITOR) 80 MG tablet Take 80 mg by mouth daily.  ? ferrous sulfate 325 (65 FE) MG tablet Take 325 mg by mouth daily with breakfast.  ? levothyroxine (SYNTHROID) 112 MCG tablet Take 1 tablet (112 mcg total) by mouth daily.  ? magnesium oxide (MAG-OX) 400 MG tablet Take 400 mg by mouth daily.  ? NEXIUM 40 MG capsule Take 1 capsule (40 mg total) by mouth daily at 12 noon.  ? Polyethyl Glycol-Propyl Glycol (SYSTANE) 0.4-0.3 % GEL ophthalmic gel Place 1 application into both eyes every 4 (four) hours as needed.  ? Semaglutide, 2 MG/DOSE, 8 MG/3ML SOPN Inject 2 mg as directed once a week.  ? valsartan-hydrochlorothiazide (DIOVAN-HCT) 320-12.5 MG tablet Take 1 tablet by mouth daily.  ? Vitamin D, Ergocalciferol, (DRISDOL) 1.25 MG (50000 UNIT) CAPS capsule Take 1 capsule (50,000 Units total) by mouth 3 (three) times a week.  ? vortioxetine HBr (TRINTELLIX) 10 MG TABS tablet Take 1 tablet (10 mg total) by mouth daily.  ? White Petrolatum-Mineral Oil (SYSTANE NIGHTTIME) OINT Apply 1 application to eye at bedtime.  ? zolpidem (AMBIEN CR) 12.5 MG CR tablet Take 1 tablet by mouth at bedtime as needed  ? ?No facility-administered  encounter medications on file as of 04/18/2021.  ? ? ?Reviewed chart for medication changes ahead of medication coordination call. ? ?No Consults, or hospital visits since last care coordination call/Pharmacist visit.  ? ? ?BP Readings from Last 3 Encounters:  ?02/20/21 136/84  ?10/16/20 132/84  ?08/13/20 (!) 150/88  ?  ?Lab Results  ?Component Value Date  ? HGBA1C 5.7 (H) 02/20/2021  ?  ? ?Patient obtains medications through Adherence Packaging  30 Days  ? ?Patient is due for her first adherence delivery on: 04/30/21. ?Called patient and reviewed medications and coordinated delivery. ? ?This delivery to include: ?Zolpidem 12.544m 1 at bedtime prn (Bottles)  ?Atorvastatin 806m at BT ?Amlodipine 28m42mat B ?Valsartan-HCTZ 320-12.5mg 34mt B ?Levothyroxine 112mcg4mt BB ?Vitamin D2 1250mcg 42m B on Monday, Wednesdays, Fridays  ?Nexium 40mg 1 21mB ? ?Patient declined the following medications  ?Clonidine 0.1mg Dr. 57m D/C tTobie Poet med  ?Trintellix - Pt received a 90ds supply form a mail order pharmacy and stated she has plenty until her next delivery ? ?Patient needs refills - Request sent  ?Zolpidem 12.5mg ? ?Ac102m Fill/Short Fill  ?Ozempic 8mg/3ml ne11m on 04/24/21 ? ?Confirmed delivery date of 04/30/21, advised patient that pharmacy will contact them the morning of delivery. ? ?Danielle GeElray Mcgregorical Pharmacist Assistant  ?[phone]

## 2021-04-19 ENCOUNTER — Other Ambulatory Visit: Payer: Self-pay

## 2021-04-19 NOTE — Telephone Encounter (Signed)
Compliant on meds ? ?

## 2021-04-21 MED ORDER — ZOLPIDEM TARTRATE ER 12.5 MG PO TBCR: EXTENDED_RELEASE_TABLET | ORAL | 5 refills | Status: DC

## 2021-04-24 ENCOUNTER — Other Ambulatory Visit: Payer: Self-pay

## 2021-04-24 MED ORDER — ZOLPIDEM TARTRATE ER 12.5 MG PO TBCR: EXTENDED_RELEASE_TABLET | ORAL | 5 refills | Status: DC

## 2021-05-17 ENCOUNTER — Telehealth: Payer: Self-pay

## 2021-05-17 NOTE — Progress Notes (Signed)
? ? ?Chronic Care Management ?Pharmacy Assistant  ? ?Name: Donna Mayer  MRN: [id number] DOB: Oct 06, 1955 ? ? ?Reason for Encounter: Medication Coordination for Upstream  ?  ?Recent office visits:  ?None ? ?Recent consult visits:  ?None ? ?Hospital visits:  ?None ? ?Medications: ?Outpatient Encounter Medications as of 05/17/2021  ?Medication Sig  ? amLODipine (NORVASC) 10 MG tablet Take 1 tablet (10 mg total) by mouth daily.  ? atorvastatin (LIPITOR) 80 MG tablet TAKE 1 TABLET(40 MG) BY MOUTH DAILY  ? atorvastatin (LIPITOR) 80 MG tablet Take 80 mg by mouth daily.  ? ferrous sulfate 325 (65 FE) MG tablet Take 325 mg by mouth daily with breakfast.  ? levothyroxine (SYNTHROID) 112 MCG tablet Take 1 tablet (112 mcg total) by mouth daily.  ? magnesium oxide (MAG-OX) 400 MG tablet Take 400 mg by mouth daily.  ? NEXIUM 40 MG capsule Take 1 capsule (40 mg total) by mouth daily at 12 noon.  ? Polyethyl Glycol-Propyl Glycol (SYSTANE) 0.4-0.3 % GEL ophthalmic gel Place 1 application into both eyes every 4 (four) hours as needed.  ? Semaglutide, 2 MG/DOSE, 8 MG/3ML SOPN Inject 2 mg as directed once a week.  ? valsartan-hydrochlorothiazide (DIOVAN-HCT) 320-12.5 MG tablet Take 1 tablet by mouth daily.  ? Vitamin D, Ergocalciferol, (DRISDOL) 1.25 MG (50000 UNIT) CAPS capsule Take 1 capsule (50,000 Units total) by mouth 3 (three) times a week.  ? vortioxetine HBr (TRINTELLIX) 10 MG TABS tablet Take 1 tablet (10 mg total) by mouth daily.  ? White Petrolatum-Mineral Oil (SYSTANE NIGHTTIME) OINT Apply 1 application to eye at bedtime.  ? zolpidem (AMBIEN CR) 12.5 MG CR tablet Take 1 tablet by mouth at bedtime as needed  ? ?No facility-administered encounter medications on file as of 05/17/2021.  ? ? ?Reviewed chart for medication changes ahead of medication coordination call. ? ?No OVs, Consults, or hospital visits since last care coordination call/Pharmacist visit.  ? ?No medication changes indicated OR if recent visit, treatment plan  here. ? ?BP Readings from Last 3 Encounters:  ?02/20/21 136/84  ?10/16/20 132/84  ?08/13/20 (!) 150/88  ?  ?Lab Results  ?Component Value Date  ? HGBA1C 5.7 (H) 02/20/2021  ?  ? ?Patient obtains medications through Adherence Packaging  30 Days  ? ?Last adherence delivery included:  ?Zolpidem 12.52m- 1 at bedtime prn (Bottles)  ?Atorvastatin 836m1 at BT ?Amlodipine 1040m at B ?Valsartan-HCTZ 320-12.5mg53mat B ?Levothyroxine 112mc76mat BB ?Vitamin D2 1250mcg36mt B on Monday, Wednesdays, Fridays  ?Nexium 40mg 181mBB ? ?Patient declined (meds) last month  ?Clonidine 0.1mg Dr.7mx D/C Tobie Poets med  ?Trintellix - Pt received a 90ds supply form a mail order pharmacy and stated she has plenty until her next delivery ?  ?Patient is due for next adherence delivery on: 05/29/21. ?Called patient and reviewed medications and coordinated delivery. ? ?This delivery to include: ?Zolpidem 12.5mg- 1 a70medtime prn (Bottles)  ?Atorvastatin 80mg 1 at41m?Amlodipine 10mg 1 at 51malsartan-HCTZ 320-12.5mg 1 at B 80mvothyroxine 112mcg 1 at B22mitamin D2 1250mcg 1 at B 57monday, Wednesdays, Fridays  ?Nexium 40mg 1 at BB ?53mtient declined the following medications  ?Ozempic set to fill on 5/8 ?Trintellix - Pt cut it to 5mg because it 77m hurting her stomach on the 10mg so she has 46m left until next delivery. Pt will make Dr. Cox aware at her Tobie Poeting appt.  ? ?Patient needs refills  ?None ? ?Confirmed  delivery date of 05/29/21, advised patient that pharmacy will contact them the morning of delivery. ? ?Elray Mcgregor, CMA ?Clinical Pharmacist Assistant  ?[phone]  ?

## 2021-05-20 NOTE — Telephone Encounter (Signed)
Compliant on meds ?

## 2021-05-23 ENCOUNTER — Telehealth: Payer: Self-pay

## 2021-05-23 NOTE — Chronic Care Management (AMB) (Signed)
    Chronic Care Management Pharmacy Assistant   Name: DARA BEIDLEMAN  MRN: [id number] DOB: 06/27/1955  Reason for Encounter: Prior Authorization Coordination    Prior authorization completed in Covermymeds for Nexium 40 mg, awaiting determination.  Approved on May 11- [place] Case: [id number], Status: Approved, Coverage Starts on: 01/13/2021 12:00:00 AM, Coverage Ends on: 01/12/2022 12:00:00 AM  Medications: Outpatient Encounter Medications as of 05/23/2021  Medication Sig   amLODipine (NORVASC) 10 MG tablet Take 1 tablet (10 mg total) by mouth daily.   atorvastatin (LIPITOR) 80 MG tablet TAKE 1 TABLET(40 MG) BY MOUTH DAILY (Patient not taking: Reported on 05/20/2021)   atorvastatin (LIPITOR) 80 MG tablet Take 80 mg by mouth daily.   ferrous sulfate 325 (65 FE) MG tablet Take 325 mg by mouth daily with breakfast.   levothyroxine (SYNTHROID) 112 MCG tablet Take 1 tablet (112 mcg total) by mouth daily.   magnesium oxide (MAG-OX) 400 MG tablet Take 400 mg by mouth daily.   NEXIUM 40 MG capsule Take 1 capsule (40 mg total) by mouth daily at 12 noon.   Polyethyl Glycol-Propyl Glycol (SYSTANE) 0.4-0.3 % GEL ophthalmic gel Place 1 application into both eyes every 4 (four) hours as needed.   Semaglutide, 2 MG/DOSE, 8 MG/3ML SOPN Inject 2 mg as directed once a week.   valsartan-hydrochlorothiazide (DIOVAN-HCT) 320-12.5 MG tablet Take 1 tablet by mouth daily.   Vitamin D, Ergocalciferol, (DRISDOL) 1.25 MG (50000 UNIT) CAPS capsule Take 1 capsule (50,000 Units total) by mouth 3 (three) times a week.   vortioxetine HBr (TRINTELLIX) 10 MG TABS tablet Take 1 tablet (10 mg total) by mouth daily.   White Petrolatum-Mineral Oil (SYSTANE NIGHTTIME) OINT Apply 1 application to eye at bedtime.   zolpidem (AMBIEN CR) 12.5 MG CR tablet Take 1 tablet by mouth at bedtime as needed   No facility-administered encounter medications on file as of 05/23/2021.    Pattricia Boss, [place] Pharmacist  Assistant [phone]

## 2021-06-04 DIAGNOSIS — H524 Presbyopia: Secondary | ICD-10-CM | POA: Diagnosis not present

## 2021-06-04 DIAGNOSIS — Z0100 Encounter for examination of eyes and vision without abnormal findings: Secondary | ICD-10-CM | POA: Diagnosis not present

## 2021-06-10 ENCOUNTER — Other Ambulatory Visit: Payer: Self-pay | Admitting: Family Medicine

## 2021-06-13 ENCOUNTER — Telehealth: Payer: Self-pay

## 2021-06-13 NOTE — Chronic Care Management (AMB) (Signed)
[employer] patient assistance program notification:  120- day supply of Ozempic 88m/ml will be filled on 06/28/2021 and   should arrive to the office in 10-14 business days. Patient enrollment will expire on 12/12/2021.  TPattricia Boss CCloverdalePharmacist Assistant [phone]

## 2021-06-17 ENCOUNTER — Telehealth: Payer: Self-pay

## 2021-06-17 NOTE — Progress Notes (Signed)
Chronic Care Management Pharmacy Assistant   Name: Donna Mayer  MRN: [id number] DOB: 1955-05-25   Reason for Encounter: Medication Coordination for Upstream    Recent office visits:  None  Recent consult visits:  None  Hospital visits:  None  Medications: Outpatient Encounter Medications as of 06/17/2021  Medication Sig   amLODipine (NORVASC) 10 MG tablet Take 1 tablet (10 mg total) by mouth daily.   atorvastatin (LIPITOR) 80 MG tablet TAKE 1 TABLET(40 MG) BY MOUTH DAILY (Patient not taking: Reported on 05/20/2021)   atorvastatin (LIPITOR) 80 MG tablet Take 80 mg by mouth daily.   ferrous sulfate 325 (65 FE) MG tablet Take 325 mg by mouth daily with breakfast.   levothyroxine (SYNTHROID) 112 MCG tablet Take 1 tablet (112 mcg total) by mouth daily.   magnesium oxide (MAG-OX) 400 MG tablet Take 400 mg by mouth daily.   NEXIUM 40 MG capsule Take 1 capsule (40 mg total) by mouth daily at 12 noon.   OZEMPIC, 2 MG/DOSE, 8 MG/3ML SOPN inject 39m into THE SKIN ONCE A WEEK AS DIRECTED   Polyethyl Glycol-Propyl Glycol (SYSTANE) 0.4-0.3 % GEL ophthalmic gel Place 1 application into both eyes every 4 (four) hours as needed.   valsartan-hydrochlorothiazide (DIOVAN-HCT) 320-12.5 MG tablet Take 1 tablet by mouth daily.   Vitamin D, Ergocalciferol, (DRISDOL) 1.25 MG (50000 UNIT) CAPS capsule Take 1 capsule (50,000 Units total) by mouth 3 (three) times a week.   vortioxetine HBr (TRINTELLIX) 10 MG TABS tablet Take 1 tablet (10 mg total) by mouth daily.   White Petrolatum-Mineral Oil (SYSTANE NIGHTTIME) OINT Apply 1 application to eye at bedtime.   zolpidem (AMBIEN CR) 12.5 MG CR tablet Take 1 tablet by mouth at bedtime as needed   No facility-administered encounter medications on file as of 06/17/2021.    Reviewed chart for medication changes ahead of medication coordination call.  No OVs, Consults, or hospital visits since last care coordination call/Pharmacist visit.  No medication  changes indicated OR if recent visit, treatment plan here.  BP Readings from Last 3 Encounters:  02/20/21 136/84  10/16/20 132/84  08/13/20 (!) 150/88    Lab Results  Component Value Date   HGBA1C 5.7 (H) 02/20/2021     Patient obtains medications through Adherence Packaging  30 Days   Last adherence delivery included: Zolpidem 12.574m 1 at bedtime prn (Bottles)  Atorvastatin 8025m at BT Amlodipine 55m70mat B Valsartan-HCTZ 320-12.5mg 33mt B Levothyroxine 112mcg92mt BB Vitamin D2 1250mcg 63m B on Monday, Wednesdays, Fridays  Nexium 40mg 1 54mB  Patient declined (meds) last month  Ozempic set to fill on 5/8 Trintellix - Pt cut it to 5mg beca43m it was hurting her stomach on the 55mg so s66mas some left until next delivery. Pt will make Dr. Cox aware Tobie Poether coming appt.   Patient is due for next adherence delivery on: 06/27/21. Called patient and reviewed medications and coordinated delivery.  This delivery to include: Zolpidem 12.5mg- 1 at 67mtime prn (Bottles)  Atorvastatin 80mg 1 at B45mlodipine 55mg 1 at B 30martan-HCTZ 320-12.5mg 1 at B Le48mhyroxine 112mcg 1 at BB 63mmin D2 1250mcg 1 at B on73mday, Wednesdays, Fridays   (Pt is requesting Brand Name Synthroid sent in instead of generic. She stated she is having symptoms on generic and feels its not working. I have sent the clinical pool a message on this)   Patient declined the following medications Ozempic filled  06/12/21 not due until 07/10/21 and on auto refill.  Patient denied nexium last delivery told Reubin Milan she gets OTC Trintellix - Pt cut it to 32m because it was hurting her stomach on the 197mso she has some left   Patient needs refills  Vitamin D2 125016m  Confirmed delivery date of 06/27/21, advised patient that pharmacy will contact them the morning of delivery.   DanElray McgregorMASleepy Hollowarmacist Assistant  [phone]

## 2021-06-18 NOTE — Telephone Encounter (Signed)
Compliant on meds

## 2021-06-20 ENCOUNTER — Other Ambulatory Visit: Payer: Self-pay | Admitting: Family Medicine

## 2021-06-20 DIAGNOSIS — E559 Vitamin D deficiency, unspecified: Secondary | ICD-10-CM

## 2021-06-24 ENCOUNTER — Other Ambulatory Visit: Payer: Self-pay

## 2021-06-24 MED ORDER — LEVOTHYROXINE SODIUM 112 MCG PO TABS: 112.0000 ug | ORAL_TABLET | Freq: Every day | ORAL | 0 refills | Status: DC

## 2021-06-25 ENCOUNTER — Other Ambulatory Visit: Payer: Self-pay | Admitting: Family Medicine

## 2021-06-25 MED ORDER — SYNTHROID 112 MCG PO TABS: 112.0000 ug | ORAL_TABLET | Freq: Every day | ORAL | 1 refills | Status: DC

## 2021-06-26 DIAGNOSIS — Z79890 Hormone replacement therapy (postmenopausal): Secondary | ICD-10-CM | POA: Diagnosis not present

## 2021-06-26 DIAGNOSIS — I110 Hypertensive heart disease with heart failure: Secondary | ICD-10-CM | POA: Diagnosis not present

## 2021-06-26 DIAGNOSIS — I5020 Unspecified systolic (congestive) heart failure: Secondary | ICD-10-CM | POA: Diagnosis not present

## 2021-06-26 DIAGNOSIS — E890 Postprocedural hypothyroidism: Secondary | ICD-10-CM | POA: Diagnosis not present

## 2021-06-26 DIAGNOSIS — Z6841 Body Mass Index (BMI) 40.0 and over, adult: Secondary | ICD-10-CM | POA: Diagnosis not present

## 2021-06-28 ENCOUNTER — Other Ambulatory Visit: Payer: Medicare HMO

## 2021-06-28 DIAGNOSIS — E782 Mixed hyperlipidemia: Secondary | ICD-10-CM

## 2021-06-28 DIAGNOSIS — I10 Essential (primary) hypertension: Secondary | ICD-10-CM

## 2021-06-28 DIAGNOSIS — R7301 Impaired fasting glucose: Secondary | ICD-10-CM | POA: Diagnosis not present

## 2021-06-28 DIAGNOSIS — E559 Vitamin D deficiency, unspecified: Secondary | ICD-10-CM | POA: Diagnosis not present

## 2021-06-29 LAB — LIPID PANEL
Chol/HDL Ratio: 2.5 ratio (ref 0.0–4.4)
Cholesterol, Total: 198 mg/dL (ref 100–199)
HDL: 78 mg/dL (ref >39)
LDL Chol Calc (NIH): 107 mg/dL — ABNORMAL HIGH (ref 0–99)
Triglycerides: 71 mg/dL (ref 0–149)
VLDL Cholesterol Cal: 13 mg/dL (ref 5–40)

## 2021-06-29 LAB — COMPREHENSIVE METABOLIC PANEL
ALT: 9 IU/L (ref 0–32)
AST: 14 IU/L (ref 0–40)
Albumin/Globulin Ratio: 1.5 (ref 1.2–2.2)
Albumin: 4.5 g/dL (ref 3.8–4.8)
Alkaline Phosphatase: 94 IU/L (ref 44–121)
BUN/Creatinine Ratio: 22 (ref 12–28)
BUN: 18 mg/dL (ref 8–27)
Bilirubin Total: 0.6 mg/dL (ref 0.0–1.2)
CO2: 23 mmol/L (ref 20–29)
Calcium: 9.7 mg/dL (ref 8.7–10.3)
Chloride: 99 mmol/L (ref 96–106)
Creatinine, Ser: 0.83 mg/dL (ref 0.57–1.00)
Globulin, Total: 3.1 g/dL (ref 1.5–4.5)
Glucose: 86 mg/dL (ref 70–99)
Potassium: 3.7 mmol/L (ref 3.5–5.2)
Sodium: 137 mmol/L (ref 134–144)
Total Protein: 7.6 g/dL (ref 6.0–8.5)
eGFR: 79 mL/min/{1.73_m2} (ref >59)

## 2021-06-29 LAB — CBC WITH DIFFERENTIAL/PLATELET
Basophils Absolute: 0 10*3/uL (ref 0.0–0.2)
Basos: 0 %
EOS (ABSOLUTE): 0.1 10*3/uL (ref 0.0–0.4)
Eos: 2 %
Hematocrit: 33.2 % — ABNORMAL LOW (ref 34.0–46.6)
Hemoglobin: 10.5 g/dL — ABNORMAL LOW (ref 11.1–15.9)
Immature Grans (Abs): 0 10*3/uL (ref 0.0–0.1)
Immature Granulocytes: 0 %
Lymphocytes Absolute: 1.1 10*3/uL (ref 0.7–3.1)
Lymphs: 21 %
MCH: 25.4 pg — ABNORMAL LOW (ref 26.6–33.0)
MCHC: 31.6 g/dL (ref 31.5–35.7)
MCV: 80 fL (ref 79–97)
Monocytes Absolute: 0.5 10*3/uL (ref 0.1–0.9)
Monocytes: 10 %
Neutrophils Absolute: 3.4 10*3/uL (ref 1.4–7.0)
Neutrophils: 67 %
Platelets: 249 10*3/uL (ref 150–450)
RBC: 4.14 x10E6/uL (ref 3.77–5.28)
RDW: 14.8 % (ref 11.7–15.4)
WBC: 5.2 10*3/uL (ref 3.4–10.8)

## 2021-06-29 LAB — HEMOGLOBIN A1C
Est. average glucose Bld gHb Est-mCnc: 114 mg/dL
Hgb A1c MFr Bld: 5.6 % (ref 4.8–5.6)

## 2021-06-29 LAB — VITAMIN D 25 HYDROXY (VIT D DEFICIENCY, FRACTURES): Vit D, 25-Hydroxy: 55.8 ng/mL (ref 30.0–100.0)

## 2021-06-29 LAB — CARDIOVASCULAR RISK ASSESSMENT

## 2021-06-30 ENCOUNTER — Encounter: Payer: Self-pay | Admitting: Family Medicine

## 2021-07-02 ENCOUNTER — Ambulatory Visit (INDEPENDENT_AMBULATORY_CARE_PROVIDER_SITE_OTHER): Payer: Medicare HMO | Admitting: Family Medicine

## 2021-07-02 ENCOUNTER — Other Ambulatory Visit: Payer: Self-pay | Admitting: Family Medicine

## 2021-07-02 VITALS — BP 132/88 | HR 88 | Temp 97.1°F | Resp 18 | Ht 62.0 in | Wt 240.0 lb

## 2021-07-02 DIAGNOSIS — E890 Postprocedural hypothyroidism: Secondary | ICD-10-CM | POA: Diagnosis not present

## 2021-07-02 DIAGNOSIS — F330 Major depressive disorder, recurrent, mild: Secondary | ICD-10-CM | POA: Diagnosis not present

## 2021-07-02 DIAGNOSIS — E782 Mixed hyperlipidemia: Secondary | ICD-10-CM

## 2021-07-02 DIAGNOSIS — R7303 Prediabetes: Secondary | ICD-10-CM

## 2021-07-02 DIAGNOSIS — I10 Essential (primary) hypertension: Secondary | ICD-10-CM

## 2021-07-02 DIAGNOSIS — E559 Vitamin D deficiency, unspecified: Secondary | ICD-10-CM | POA: Diagnosis not present
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
## 2021-07-04 DIAGNOSIS — R7303 Prediabetes: Secondary | ICD-10-CM | POA: Insufficient documentation

## 2021-07-04 NOTE — Assessment & Plan Note (Signed)
Continue Trintellix 31m once daily.

## 2021-07-04 NOTE — Assessment & Plan Note (Signed)
Continue Amlodipine 10 mg 1 tablet daily and Diovan HCT 320/12.30m daily. Check BP daily and if BP increases above 140/90 patient instructed to call the office.

## 2021-07-04 NOTE — Assessment & Plan Note (Signed)
Continue current medications. Continue to follow up with Endocrinology.

## 2021-07-04 NOTE — Assessment & Plan Note (Addendum)
Control: Good Recommend to continue to eat healthy and exercise daily. Continue Ozempic 80m once weekly, and continue seeking PAP.

## 2021-07-04 NOTE — Assessment & Plan Note (Signed)
The current medical regimen is effective; continue present plan and medications. Continue Vitamin D 50K once weekly.

## 2021-07-04 NOTE — Assessment & Plan Note (Signed)
Well controlled.  No changes to medicines. Continue Lipitor 40 mg 1 tablet daily. Continue to work on eating a healthy diet and exercise.  Labs drawn today.

## 2021-07-07 ENCOUNTER — Encounter: Payer: Self-pay | Admitting: Family Medicine

## 2021-07-17 ENCOUNTER — Other Ambulatory Visit: Payer: Self-pay

## 2021-07-17 ENCOUNTER — Telehealth: Payer: Self-pay

## 2021-07-17 MED ORDER — ATORVASTATIN CALCIUM 80 MG PO TABS: ORAL_TABLET | ORAL | 1 refills | Status: DC

## 2021-07-17 MED ORDER — AMLODIPINE BESYLATE 10 MG PO TABS: 10.0000 mg | ORAL_TABLET | Freq: Every day | ORAL | 1 refills | Status: DC

## 2021-07-17 NOTE — Progress Notes (Signed)
Chronic Care Management Pharmacy Assistant   Name: Donna Mayer  MRN: [id number] DOB: 03-07-1955   Reason for Encounter: Medication Coordination for [facility]    Recent office visits:  07/02/21 [name] MD. Seen for routine visit. No med changes.  06/25/21 [name] MD. Changed Levothyroxine to Brand name only.   Recent consult visits:  None  Hospital visits:  None  Medications: Outpatient Encounter Medications as of 07/17/2021  Medication Sig   amLODipine (NORVASC) 10 MG tablet Take 1 tablet (10 mg total) by mouth daily.   atorvastatin (LIPITOR) 80 MG tablet TAKE 1 TABLET(40 MG) BY MOUTH DAILY   ferrous sulfate 325 (65 FE) MG tablet Take 325 mg by mouth daily with breakfast.   magnesium oxide (MAG-OX) 400 MG tablet Take 400 mg by mouth daily. (Patient not taking: Reported on 07/02/2021)   NEXIUM 40 MG capsule Take 1 capsule (40 mg total) by mouth daily at 12 noon.   OZEMPIC, 2 MG/DOSE, 8 MG/3ML SOPN inject 3m into THE SKIN ONCE A WEEK AS DIRECTED   Polyethyl Glycol-Propyl Glycol (SYSTANE) 0.4-0.3 % GEL ophthalmic gel Place 1 application into both eyes every 4 (four) hours as needed.   SYNTHROID 112 MCG tablet Take 1 tablet (112 mcg total) by mouth daily before breakfast.   valsartan-hydrochlorothiazide (DIOVAN-HCT) 320-12.5 MG tablet Take 1 tablet by mouth daily.   Vitamin D, Ergocalciferol, (DRISDOL) 1.25 MG (50000 UNIT) CAPS capsule TAKE ONE CAPSULE BY MOUTH weekly on monday, wednesday, and friday   vortioxetine HBr (TRINTELLIX) 10 MG TABS tablet Take 1 tablet (10 mg total) by mouth daily.   White Petrolatum-Mineral Oil (SYSTANE NIGHTTIME) OINT Apply 1 application to eye at bedtime.   zolpidem (AMBIEN CR) 12.5 MG CR tablet Take 1 tablet by mouth at bedtime as needed   No facility-administered encounter medications on file as of 07/17/2021.    Reviewed chart for medication changes ahead of medication coordination call.  No  Consults, or hospital visits since last care  coordination call/Pharmacist visit.    BP Readings from Last 3 Encounters:  07/02/21 132/88  02/20/21 136/84  10/16/20 132/84    Lab Results  Component Value Date   HGBA1C 5.6 06/28/2021     Patient obtains medications through Adherence Packaging  30 Days   Last adherence delivery included:  Zolpidem 12.53m 1 at bedtime prn (Bottles)  Atorvastatin 8023m at BT Amlodipine 36m14mat B Valsartan-HCTZ 320-12.5mg 51mt B Levothyroxine 112mcg88mt BB Vitamin D2 1250mcg 75m B on Monday, Wednesdays, Fridays   Patient declined (meds) last month  Ozempic filled 06/12/21 not due until 07/10/21 and on auto refill.  Patient denied nexium last delivery told Kelsi sReubin Milants OTC Trintellix - Pt cut it to 5mg bec48me it was hurting her stomach on the 36mg so 53mhas some left   Patient is due for next adherence delivery on: 07/29/21. Called patient and reviewed medications and coordinated delivery.  This delivery to include: Zolpidem 12.5mg- 1 at66mdtime prn (Bottles)  Atorvastatin 80mg 1 at 88mmlodipine 36mg 1 at B19msartan-HCTZ 320-12.5mg 1 at B S8mhroid 1 at BB Vitamin D2 1250mcg 1 at B 46monday, Wednesdays, Fridays   Patient declined the following medications  Ozempic 8mg/3ml - Pt i32mow receiving PAP Trintellix - Pt cut it to 5mg because it 81m hurting her stomach on the 36mg so she has 29m left   Patient needs refills-Request Sent  Atorvastatin 80mg  Amlodipine 52m59m  Confirmed delivery date of 07/29/21, advised patient that pharmacy will contact them the morning of delivery.  Elray Mcgregor, [place] Pharmacist Assistant  [phone]

## 2021-07-17 NOTE — Telephone Encounter (Signed)
Compliant on meds

## 2021-07-18 ENCOUNTER — Ambulatory Visit: Payer: Medicare HMO | Admitting: Family Medicine

## 2021-07-18 ENCOUNTER — Encounter: Payer: Self-pay | Admitting: Family Medicine

## 2021-07-18 VITALS — BP 136/78 | Ht 62.0 in | Wt 240.0 lb

## 2021-07-18 DIAGNOSIS — Z0000 Encounter for general adult medical examination without abnormal findings: Secondary | ICD-10-CM

## 2021-07-18 NOTE — Patient Instructions (Signed)
Donna Mayer , Thank you for taking time to come for your Medicare Wellness Visit. I appreciate your ongoing commitment to your health goals. Please review the following plan we discussed and let me know if I can assist you in the future.   Screening recommendations/referrals: Colonoscopy: Due 2027 Mammogram: Due 03/2022 Bone Density: Due  Recommended yearly ophthalmology/optometry visit for glaucoma screening and checkup Recommended yearly dental visit for hygiene and checkup  Vaccinations: Influenza vaccine: up to date Pneumococcal vaccine: Due at 65 Tdap vaccine: Due Shingles vaccine: Due- please go to local pharmacy for this series     Advanced directives: Declined paperwork at this time, but we are happy to help if you change your mind  Conditions/risks identified: Falls are a big risk for [place] all, please move mindfully and let us know if you have a fall!  Next appointment: As scheduled  Preventive Care 40-64 Years, Female Preventive care refers to lifestyle choices and visits with your health care provider that can promote health and wellness. What does preventive care include? A yearly physical exam. This is also called an annual well check. Dental exams once or twice a year. Routine eye exams. Ask your health care provider how often you should have your eyes checked. Personal lifestyle choices, including: Daily care of your teeth and gums. Regular physical activity. Eating a healthy diet. Avoiding tobacco and drug use. Limiting alcohol use. Practicing safe sex. Taking low-dose aspirin daily starting at age 34. Taking vitamin and mineral supplements as recommended by your health care provider. What happens during an annual well check? The services and screenings done by your health care provider during your annual well check will depend on your age, overall health, lifestyle risk factors, and family history of disease. Counseling  Your health care provider may ask you  questions about your: Alcohol use. Tobacco use. Drug use. Emotional well-being. Home and relationship well-being. Sexual activity. Eating habits. Work and work Statistician. Method of birth control. Menstrual cycle. Pregnancy history. Screening  You may have the following tests or measurements: Height, weight, and BMI. Blood pressure. Lipid and cholesterol levels. These may be checked every 5 years, or more frequently if you are over 41 years old. Skin check. Lung cancer screening. You may have this screening every year starting at age 88 if you have a 30-pack-year history of smoking and currently smoke or have quit within the past 15 years. Fecal occult blood test (FOBT) of the stool. You may have this test every year starting at age 42. Flexible sigmoidoscopy or colonoscopy. You may have a sigmoidoscopy every 5 years or a colonoscopy every 10 years starting at age 31. Hepatitis C blood test. Hepatitis B blood test. Sexually transmitted disease (STD) testing. Diabetes screening. This is done by checking your blood sugar (glucose) after you have not eaten for a while (fasting). You may have this done every 1-3 years. Mammogram. This may be done every 1-2 years. Talk to your health care provider about when you should start having regular mammograms. This may depend on whether you have a family history of breast cancer. BRCA-related cancer screening. This may be done if you have a family history of breast, ovarian, tubal, or peritoneal cancers. Pelvic exam and Pap test. This may be done every 3 years starting at age 54. Starting at age 35, this may be done every 5 years if you have a Pap test in combination with an HPV test. Bone density scan. This is done to screen for osteoporosis.  You may have this scan if you are at high risk for osteoporosis. Discuss your test results, treatment options, and if necessary, the need for more tests with your health care provider. Vaccines  Your health  care provider may recommend certain vaccines, such as: Influenza vaccine. This is recommended every year. Tetanus, diphtheria, and acellular pertussis (Tdap, Td) vaccine. You may need a Td booster every 10 years. Zoster vaccine. You may need this after age 27. Pneumococcal 13-valent conjugate (PCV13) vaccine. You may need this if you have certain conditions and were not previously vaccinated. Pneumococcal polysaccharide (PPSV23) vaccine. You may need one or two doses if you smoke cigarettes or if you have certain conditions. Talk to your health care provider about which screenings and vaccines you need and how often you need them. This information is not intended to replace advice given to you by your health care provider. Make sure you discuss any questions you have with your health care provider. Document Released: 01/26/2015 Document Revised: 09/19/2015 Document Reviewed: 10/31/2014 Elsevier Interactive Patient Education  2017 [employer] [employer] Prevention in the Home Falls can cause injuries. They can happen to people of all ages. There are many things you can do to make your home safe and to help prevent falls. What can I do on the outside of my home? Regularly fix the edges of walkways and driveways and fix any cracks. Remove anything that might make you trip as you walk through a door, such as a raised step or threshold. Trim any bushes or trees on the path to your home. Use bright outdoor lighting. Clear any walking paths of anything that might make someone trip, such as rocks or tools. Regularly check to see if handrails are loose or broken. Make sure that both sides of any steps have handrails. Any raised decks and porches should have guardrails on the edges. Have any leaves, snow, or ice cleared regularly. Use sand or salt on walking paths during winter. Clean up any spills in your garage right away. This includes oil or grease spills. What can I do in the bathroom? Use  night lights. Install grab bars by the toilet and in the tub and shower. Do not use towel bars as grab bars. Use non-skid mats or decals in the tub or shower. If you need to sit down in the shower, use a plastic, non-slip stool. Keep the floor dry. Clean up any water that spills on the floor as soon as it happens. Remove soap buildup in the tub or shower regularly. Attach bath mats securely with double-sided non-slip rug tape. Do not have throw rugs and other things on the floor that can make you trip. What can I do in the bedroom? Use night lights. Make sure that you have a light by your bed that is easy to reach. Do not use any sheets or blankets that are too big for your bed. They should not hang down onto the floor. Have a firm chair that has side arms. You can use this for support while you get dressed. Do not have throw rugs and other things on the floor that can make you trip. What can I do in the kitchen? Clean up any spills right away. Avoid walking on wet floors. Keep items that you use a lot in easy-to-reach places. If you need to reach something above you, use a strong step stool that has a grab bar. Keep electrical cords out of the way. Do not use  floor polish or wax that makes floors slippery. If you must use wax, use non-skid floor wax. Do not have throw rugs and other things on the floor that can make you trip. What can I do with my stairs? Do not leave any items on the stairs. Make sure that there are handrails on both sides of the stairs and use them. Fix handrails that are broken or loose. Make sure that handrails are as long as the stairways. Check any carpeting to make sure that it is firmly attached to the stairs. Fix any carpet that is loose or worn. Avoid having throw rugs at the top or bottom of the stairs. If you do have throw rugs, attach them to the floor with carpet tape. Make sure that you have a light switch at the top of the stairs and the bottom of the  stairs. If you do not have them, ask someone to add them for you. What else can I do to help prevent falls? Wear shoes that: Do not have high heels. Have rubber bottoms. Are comfortable and fit you well. Are closed at the toe. Do not wear sandals. If you use a stepladder: Make sure that it is fully opened. Do not climb a closed stepladder. Make sure that both sides of the stepladder are locked into place. Ask someone to hold it for you, if possible. Clearly mark and make sure that you can see: Any grab bars or handrails. First and last steps. Where the edge of each step is. Use tools that help you move around (mobility aids) if they are needed. These include: Canes. Walkers. Scooters. Crutches. Turn on the lights when you go into a dark area. Replace any light bulbs as soon as they burn out. Set up your furniture so you have a clear path. Avoid moving your furniture around. If any of your floors are uneven, fix them. If there are any pets around you, be aware of where they are. Review your medicines with your doctor. Some medicines can make you feel dizzy. This can increase your chance of falling. Ask your doctor what other things that you can do to help prevent falls. This information is not intended to replace advice given to you by your health care provider. Make sure you discuss any questions you have with your health care provider. Document Released: 10/26/2008 Document Revised: 06/07/2015 Document Reviewed: 02/03/2014 Elsevier Interactive Patient Education  2017 [employer].
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
## 2021-07-19 ENCOUNTER — Telehealth: Payer: Self-pay

## 2021-07-19 NOTE — Chronic Care Management (AMB) (Signed)
[employer] patient assistance program notification:  120- day supply of Ozempic 2 mg was filled on 07/02/2021 and should arrive to the office in 10-14 business days. Patient has 1  refill remaining and enrollment will expire on 12/12/2021.  The next refill for patient will be fulfilled on 09/18/2021.  Pattricia Boss, [place] Pharmacist Assistant [phone]
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
## 2021-08-01 ENCOUNTER — Encounter: Payer: Medicare HMO | Admitting: Nurse Practitioner

## 2021-08-01 DIAGNOSIS — Z0000 Encounter for general adult medical examination without abnormal findings: Secondary | ICD-10-CM

## 2021-08-08 ENCOUNTER — Encounter: Payer: Medicare HMO | Admitting: Nurse Practitioner

## 2021-08-15 ENCOUNTER — Other Ambulatory Visit: Payer: Self-pay | Admitting: Family Medicine

## 2021-08-15 ENCOUNTER — Telehealth: Payer: Self-pay

## 2021-08-15 NOTE — Progress Notes (Signed)
Chronic Care Management Pharmacy Assistant   Name: OCTAVIE WESTERHOLD  MRN: [id number] DOB: 10-15-1955   Reason for Encounter: Medication Coordination for Upstream    Recent office visits:  08/01/21 Jerrell Belfast NP. Seen for Medicare Annual Wellness. No med changes.  Recent consult visits:  None  Hospital visits:  None  Medications: Outpatient Encounter Medications as of 08/15/2021  Medication Sig   amLODipine (NORVASC) 10 MG tablet Take 1 tablet (10 mg total) by mouth daily.   atorvastatin (LIPITOR) 80 MG tablet TAKE 1 TABLET(40 MG) BY MOUTH DAILY   ferrous sulfate 325 (65 FE) MG tablet Take 325 mg by mouth daily with breakfast.   magnesium oxide (MAG-OX) 400 MG tablet Take 400 mg by mouth daily. (Patient not taking: Reported on 07/02/2021)   NEXIUM 40 MG capsule Take 1 capsule (40 mg total) by mouth daily at 12 noon.   OZEMPIC, 2 MG/DOSE, 8 MG/3ML SOPN inject 58m into THE SKIN ONCE A WEEK AS DIRECTED   Polyethyl Glycol-Propyl Glycol (SYSTANE) 0.4-0.3 % GEL ophthalmic gel Place 1 application into both eyes every 4 (four) hours as needed.   SYNTHROID 112 MCG tablet Take 1 tablet (112 mcg total) by mouth daily before breakfast.   valsartan-hydrochlorothiazide (DIOVAN-HCT) 320-12.5 MG tablet Take 1 tablet by mouth daily.   Vitamin D, Ergocalciferol, (DRISDOL) 1.25 MG (50000 UNIT) CAPS capsule TAKE ONE CAPSULE BY MOUTH weekly on monday, wednesday, and friday   vortioxetine HBr (TRINTELLIX) 10 MG TABS tablet Take 1 tablet (10 mg total) by mouth daily.   White Petrolatum-Mineral Oil (SYSTANE NIGHTTIME) OINT Apply 1 application to eye at bedtime.   zolpidem (AMBIEN CR) 12.5 MG CR tablet Take 1 tablet by mouth at bedtime as needed   No facility-administered encounter medications on file as of 08/15/2021.    Reviewed chart for medication changes ahead of medication coordination call.  No OVs, Consults, or hospital visits since last care coordination call/Pharmacist visit.   No  medication changes indicated OR if recent visit, treatment plan here.  BP Readings from Last 3 Encounters:  07/18/21 136/78  07/02/21 132/88  02/20/21 136/84    Lab Results  Component Value Date   HGBA1C 5.6 06/28/2021     Patient obtains medications through Adherence Packaging  30 Days   Last adherence delivery included:  Zolpidem 12.568m 1 at bedtime prn (Bottles)  Atorvastatin 8067m at BT Amlodipine 82m11mat B Valsartan-HCTZ 320-12.5mg 64mt B Synthroid 1 at BB Vitamin D2 1250mcg37mt B on Monday, Wednesdays, Fridays   Patient declined (meds) last month  Ozempic 8mg/35m57m Pt is now receiving PAP Trintellix - Pt cut it to 5mg bec45me it was hurting her stomach on the 82mg so 96mhas some left   Patient is due for next adherence delivery on: 08/27/21. Called patient and reviewed medications and coordinated delivery.  This delivery to include: Zolpidem 12.5mg- 1 at39mdtime prn (Bottles)  Atorvastatin 80mg 1 at 34mmlodipine 82mg 1 at B36msartan-HCTZ 320-12.5mg 1 at B S25mhroid 1 at BB Vitamin D2 1250mcg 1 at B 56monday, Wednesdays, Fridays   Patient declined the following medications  Ozempic 8mg/35ml - Pt i34mow receiving PAP Trintellix - Pt cut it to 5mg because it 435m hurting her stomach on the 82mg so she has 435m left  Nexium 40mg- Pt has plen535mf supply   Patient needs refills  None  Confirmed delivery date of 08/27/21, advised patient that pharmacy will contact them the  morning of delivery.   Elray Mcgregor, [place] Pharmacist Assistant  [phone]

## 2021-08-22 ENCOUNTER — Other Ambulatory Visit: Payer: Self-pay | Admitting: Family Medicine

## 2021-08-22 MED ORDER — SYSTANE 0.4-0.3 % OP SOLN: 1.0000 [drp] | Freq: Four times a day (QID) | OPHTHALMIC | 3 refills | Status: DC | PRN

## 2021-08-26 ENCOUNTER — Other Ambulatory Visit: Payer: Self-pay

## 2021-08-26 MED ORDER — SYSTANE 0.4-0.3 % OP SOLN
1.0000 [drp] | Freq: Four times a day (QID) | OPHTHALMIC | 3 refills | Status: DC | PRN
Start: 2021-08-26 — End: 2022-09-23

## 2021-08-26 MED ORDER — ZOLPIDEM TARTRATE ER 12.5 MG PO TBCR: EXTENDED_RELEASE_TABLET | ORAL | 5 refills | Status: DC

## 2021-08-26 MED ORDER — SYNTHROID 112 MCG PO TABS: 112.0000 ug | ORAL_TABLET | Freq: Every day | ORAL | 1 refills | Status: DC

## 2021-09-10 NOTE — Progress Notes (Signed)
This encounter was created in error - please disregard.

## 2021-09-11 ENCOUNTER — Telehealth: Payer: Self-pay

## 2021-09-11 NOTE — Progress Notes (Unsigned)
Chronic Care Management Pharmacy Assistant   Name: Donna Mayer  MRN: [id number] DOB: October 21, 1955   Reason for Encounter: Medication Coordination for Upstream    Recent office visits:  None  Recent consult visits:  None  Hospital visits:  None  Medications: Outpatient Encounter Medications as of 09/11/2021  Medication Sig   amLODipine (NORVASC) 10 MG tablet Take 1 tablet (10 mg total) by mouth daily.   atorvastatin (LIPITOR) 80 MG tablet TAKE 1 TABLET(40 MG) BY MOUTH DAILY   ferrous sulfate 325 (65 FE) MG tablet Take 325 mg by mouth daily with breakfast.   magnesium oxide (MAG-OX) 400 MG tablet Take 400 mg by mouth daily. (Patient not taking: Reported on 07/02/2021)   NEXIUM 40 MG capsule Take 1 capsule (40 mg total) by mouth daily at 12 noon.   OZEMPIC, 2 MG/DOSE, 8 MG/3ML SOPN inject 96m into THE SKIN ONCE A WEEK AS DIRECTED   Polyethyl Glycol-Propyl Glycol (SYSTANE) 0.4-0.3 % SOLN Apply 1 drop to eye 4 (four) times daily as needed. In both eyes.   SYNTHROID 112 MCG tablet Take 1 tablet (112 mcg total) by mouth daily before breakfast.   valsartan-hydrochlorothiazide (DIOVAN-HCT) 320-12.5 MG tablet Take 1 tablet by mouth daily.   Vitamin D, Ergocalciferol, (DRISDOL) 1.25 MG (50000 UNIT) CAPS capsule TAKE ONE CAPSULE BY MOUTH weekly on monday, wednesday, and friday   vortioxetine HBr (TRINTELLIX) 10 MG TABS tablet Take 1 tablet (10 mg total) by mouth daily.   White Petrolatum-Mineral Oil (SYSTANE NIGHTTIME) OINT Apply 1 application to eye at bedtime.   zolpidem (AMBIEN CR) 12.5 MG CR tablet Take 1 tablet by mouth at bedtime as needed   No facility-administered encounter medications on file as of 09/11/2021.    Reviewed chart for medication changes ahead of medication coordination call.  No OVs, Consults, or hospital visits since last care coordination call/Pharmacist visit.   No medication changes indicated OR if recent visit, treatment plan here.  BP Readings from Last 3  Encounters:  07/18/21 136/78  07/02/21 132/88  02/20/21 136/84    Lab Results  Component Value Date   HGBA1C 5.6 06/28/2021     Patient obtains medications through Adherence Packaging  30 Days   Last adherence delivery included:  Zolpidem 12.5658m 1 at bedtime prn (Bottles)  Atorvastatin 8058m at BT Amlodipine 65m14mat B Valsartan-HCTZ 320-12.5mg 6mt B Synthroid 1 at BB Vitamin D2 1250mcg64mt B on Monday, Wednesdays, Fridays   Patient declined (meds) last month  Ozempic 8mg/58m65m Pt is now receiving PAP Trintellix - Pt cut it to 5mg bec54me it was hurting her stomach on the 65mg so 55mhas some left  Nexium 40mg- Pt 56mplenty of supply   Patient is due for next adherence delivery on: 09/24/21. Called patient and reviewed medications and coordinated delivery.  This delivery to include: Zolpidem 12.5mg- 1 at 52mtime prn (Bottles)  Atorvastatin 80mg 1 at B49mlodipine 65mg 1 at B 258martan-HCTZ 320-12.5mg 1 at B Sy758mroid 112mcg 1 at BB 35mmin D2 1250mcg 1 at B on45mday, Wednesdays, Fridays   Patient declined the following medications  Ozempic 8mg/58ml - Pt is 22m receiving PAP Nexium 40mg- Pt has plen62mf supply  Magnesium Oxide 400mg- Trintellix -70mcut it to 5mg because it was 55mting her stomach on the 65mg so she has some88mt   Patient needs refills  Vitamin D2 1250mcg  Confirmed deli24m date of ***, advised patient that pharmacy will  contact them the morning of delivery.   Elray Mcgregor, [place] Pharmacist Assistant  [phone]

## 2021-09-12 ENCOUNTER — Other Ambulatory Visit: Payer: Self-pay

## 2021-09-12 DIAGNOSIS — E559 Vitamin D deficiency, unspecified: Secondary | ICD-10-CM

## 2021-09-12 MED ORDER — VITAMIN D (ERGOCALCIFEROL) 1.25 MG (50000 UNIT) PO CAPS: ORAL_CAPSULE | ORAL | 2 refills | Status: DC

## 2021-09-18 ENCOUNTER — Telehealth: Payer: Self-pay

## 2021-09-18 NOTE — Chronic Care Management (AMB) (Signed)
[employer] patient assistance program notification:  120- day supply of Ozempic 94m will be filled on 09/28/2021 and should arrive to the office in 10-14 business days.   TPattricia Boss CHamletPharmacist Assistant [phone]

## 2021-10-03 ENCOUNTER — Telehealth: Payer: Self-pay

## 2021-10-03 NOTE — Telephone Encounter (Signed)
Patient called and stated that she stopped taking the trintellix stating that it doesn't work and it gave her a stomach ache so she stopped taking it. She was calling to inquire if you wanted to send in something different for her anxiety? Please advise.

## 2021-10-04 NOTE — Telephone Encounter (Signed)
Patient informed and patient agreed to try it and follow up in 3-4 weeks.

## 2021-10-07 ENCOUNTER — Other Ambulatory Visit: Payer: Self-pay

## 2021-10-07 MED ORDER — BUSPIRONE HCL 7.5 MG PO TABS: 7.5000 mg | ORAL_TABLET | Freq: Two times a day (BID) | ORAL | 0 refills | Status: DC

## 2021-10-09 ENCOUNTER — Ambulatory Visit (INDEPENDENT_AMBULATORY_CARE_PROVIDER_SITE_OTHER): Payer: Medicare HMO | Admitting: Family Medicine

## 2021-10-09 ENCOUNTER — Encounter: Payer: Self-pay | Admitting: Family Medicine

## 2021-10-09 VITALS — BP 146/88 | HR 80 | Temp 97.1°F | Resp 16 | Ht 62.0 in | Wt 237.0 lb

## 2021-10-09 DIAGNOSIS — Z23 Encounter for immunization: Secondary | ICD-10-CM | POA: Insufficient documentation

## 2021-10-09 DIAGNOSIS — M545 Low back pain, unspecified: Secondary | ICD-10-CM

## 2021-10-09 DIAGNOSIS — K219 Gastro-esophageal reflux disease without esophagitis: Secondary | ICD-10-CM

## 2021-10-09 DIAGNOSIS — E782 Mixed hyperlipidemia: Secondary | ICD-10-CM

## 2021-10-09 DIAGNOSIS — F331 Major depressive disorder, recurrent, moderate: Secondary | ICD-10-CM | POA: Insufficient documentation

## 2021-10-09 DIAGNOSIS — E890 Postprocedural hypothyroidism: Secondary | ICD-10-CM

## 2021-10-09 DIAGNOSIS — R7301 Impaired fasting glucose: Secondary | ICD-10-CM | POA: Diagnosis not present

## 2021-10-09 DIAGNOSIS — I10 Essential (primary) hypertension: Secondary | ICD-10-CM

## 2021-10-09 HISTORY — DX: Encounter for immunization: Z23

## 2021-10-09 HISTORY — DX: Low back pain, unspecified: M54.50

## 2021-10-09 HISTORY — DX: Major depressive disorder, recurrent, moderate: F33.1

## 2021-10-09 MED ORDER — TIZANIDINE HCL 4 MG PO TABS: 4.0000 mg | ORAL_TABLET | Freq: Three times a day (TID) | ORAL | 0 refills | Status: DC | PRN

## 2021-10-09 NOTE — Assessment & Plan Note (Signed)
The current medical regimen is effective;  continue present plan and medications.  

## 2021-10-09 NOTE — Assessment & Plan Note (Signed)
Start tizanidine 4 mg three times a day as needed.

## 2021-10-09 NOTE — Assessment & Plan Note (Signed)
>>  ASSESSMENT AND PLAN FOR IMPAIRED FASTING GLUCOSE WRITTEN ON 10/09/2021  8:27 AM BY COX, KIRSTEN, MD  Recommend continue to work on eating healthy diet and exercise. Continue ozempic 2 mg weekly.

## 2021-10-09 NOTE — Patient Instructions (Signed)
Start on tizanidine 4 mg three times a day as needed back spasms.

## 2021-10-09 NOTE — Assessment & Plan Note (Signed)
Improved on buspirone 7.5 mg twice daily. Recommend give it more time.

## 2021-10-09 NOTE — Assessment & Plan Note (Addendum)
Recommend continue to work on eating healthy diet and exercise. Continue ozempic 2 mg weekly.

## 2021-10-09 NOTE — Assessment & Plan Note (Signed)
Well controlled.  No changes to medicines. Continue Norvasc 10 mg 1 daily, Clonidine 0.1 mg one before bed, Diovan-HCT 320/12.5 mg 1 daily.   Continue to work on eating a healthy diet and exercise.  Labs drawn today.
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
## 2021-10-09 NOTE — Assessment & Plan Note (Signed)
Well controlled.  ?No changes to medicines.  ?Continue to work on eating a healthy diet and exercise.  ?Labs drawn today.  ?

## 2021-10-09 NOTE — Assessment & Plan Note (Signed)
Previously well controlled Continue Synthroid at current dose  Recheck TSH and adjust Synthroid as indicated   

## 2021-10-10 ENCOUNTER — Other Ambulatory Visit: Payer: Self-pay | Admitting: Family Medicine

## 2021-10-10 ENCOUNTER — Ambulatory Visit
Admission: RE | Admit: 2021-10-10 | Discharge: 2021-10-10 | Disposition: A | Discharge status: Home / Self Care | Payer: Medicare HMO | Source: Ambulatory Visit | Attending: Family Medicine | Admitting: Family Medicine

## 2021-10-10 DIAGNOSIS — R928 Other abnormal and inconclusive findings on diagnostic imaging of breast: Secondary | ICD-10-CM | POA: Diagnosis not present

## 2021-10-10 DIAGNOSIS — N6320 Unspecified lump in the left breast, unspecified quadrant: Secondary | ICD-10-CM

## 2021-10-10 DIAGNOSIS — N6321 Unspecified lump in the left breast, upper outer quadrant: Secondary | ICD-10-CM | POA: Diagnosis not present

## 2021-10-10 LAB — CBC WITH DIFFERENTIAL/PLATELET
Basophils Absolute: 0 10*3/uL (ref 0.0–0.2)
Basos: 0 %
EOS (ABSOLUTE): 0.1 10*3/uL (ref 0.0–0.4)
Eos: 1 %
Hematocrit: 31.9 % — ABNORMAL LOW (ref 34.0–46.6)
Hemoglobin: 10.2 g/dL — ABNORMAL LOW (ref 11.1–15.9)
Immature Grans (Abs): 0 10*3/uL (ref 0.0–0.1)
Immature Granulocytes: 0 %
Lymphocytes Absolute: 1.1 10*3/uL (ref 0.7–3.1)
Lymphs: 20 %
MCH: 25.8 pg — ABNORMAL LOW (ref 26.6–33.0)
MCHC: 32 g/dL (ref 31.5–35.7)
MCV: 81 fL (ref 79–97)
Monocytes Absolute: 0.5 10*3/uL (ref 0.1–0.9)
Monocytes: 9 %
Neutrophils Absolute: 3.9 10*3/uL (ref 1.4–7.0)
Neutrophils: 70 %
Platelets: 283 10*3/uL (ref 150–450)
RBC: 3.95 x10E6/uL (ref 3.77–5.28)
RDW: 15.6 % — ABNORMAL HIGH (ref 11.7–15.4)
WBC: 5.5 10*3/uL (ref 3.4–10.8)

## 2021-10-10 LAB — COMPREHENSIVE METABOLIC PANEL
ALT: 9 IU/L (ref 0–32)
AST: 14 IU/L (ref 0–40)
Albumin/Globulin Ratio: 1.3 (ref 1.2–2.2)
Albumin: 4.4 g/dL (ref 3.9–4.9)
Alkaline Phosphatase: 96 IU/L (ref 44–121)
BUN/Creatinine Ratio: 19 (ref 12–28)
BUN: 13 mg/dL (ref 8–27)
Bilirubin Total: 0.5 mg/dL (ref 0.0–1.2)
CO2: 25 mmol/L (ref 20–29)
Calcium: 9.2 mg/dL (ref 8.7–10.3)
Chloride: 100 mmol/L (ref 96–106)
Creatinine, Ser: 0.67 mg/dL (ref 0.57–1.00)
Globulin, Total: 3.5 g/dL (ref 1.5–4.5)
Glucose: 82 mg/dL (ref 70–99)
Potassium: 3.8 mmol/L (ref 3.5–5.2)
Sodium: 141 mmol/L (ref 134–144)
Total Protein: 7.9 g/dL (ref 6.0–8.5)
eGFR: 98 mL/min/{1.73_m2} (ref >59)

## 2021-10-10 LAB — LIPID PANEL
Chol/HDL Ratio: 2.5 ratio (ref 0.0–4.4)
Cholesterol, Total: 195 mg/dL (ref 100–199)
HDL: 79 mg/dL (ref >39)
LDL Chol Calc (NIH): 104 mg/dL — ABNORMAL HIGH (ref 0–99)
Triglycerides: 67 mg/dL (ref 0–149)
VLDL Cholesterol Cal: 12 mg/dL (ref 5–40)

## 2021-10-10 LAB — CARDIOVASCULAR RISK ASSESSMENT

## 2021-10-10 LAB — TSH: TSH: 1.35 u[IU]/mL (ref 0.450–4.500)

## 2021-10-10 LAB — T4, FREE: Free T4: 2.11 ng/dL — ABNORMAL HIGH (ref 0.82–1.77)

## 2021-10-10 LAB — HEMOGLOBIN A1C
Est. average glucose Bld gHb Est-mCnc: 103 mg/dL
Hgb A1c MFr Bld: 5.2 % (ref 4.8–5.6)

## 2021-10-10 NOTE — Progress Notes (Signed)
Blood count abnormal. Anemia. Recommend iron sulfate 325 mg daily. Recheck cbc in 6 weeks.  Liver function normal.  Kidney function normal.  Thyroid function not quite at goal. Decrease synthroid 100 mcg once daily qam. Recheck tsh, free T4 in 6 weeks.  Cholesterol: little high. Recommend add zetia 10 mg daily to atorvastatin 80 mg before bed.  HBA1C: 5.2

## 2021-10-11 ENCOUNTER — Telehealth: Payer: Self-pay

## 2021-10-11 ENCOUNTER — Other Ambulatory Visit: Payer: Self-pay

## 2021-10-11 DIAGNOSIS — E890 Postprocedural hypothyroidism: Secondary | ICD-10-CM

## 2021-10-11 MED ORDER — SYNTHROID 100 MCG PO TABS: 100.0000 ug | ORAL_TABLET | Freq: Every day | ORAL | 0 refills | Status: DC

## 2021-10-11 NOTE — Progress Notes (Unsigned)
Chronic Care Management Pharmacy Assistant   Name: Donna Mayer  MRN: [id number] DOB: 06-18-1955   Reason for Encounter: Medication Coordination for [facility]    Recent office visits:  10/09/21 Donna Brome MD. Seen for routine visit. Started on Tizanidine HCI 68m. Recommend iron sulfate 325 mg daily.  Decrease synthroid 100 mcg once daily qam. Recommend add zetia 10 mg daily to atorvastatin 80 mg before bed.  10/03/21 CRochel BromeMD. Telephone message. Start on buspirone 7.5 mg twice daily.  Recent consult visits:  None  Hospital visits:  None  Medications: Outpatient Encounter Medications as of 10/11/2021  Medication Sig   amLODipine (NORVASC) 10 MG tablet Take 1 tablet (10 mg total) by mouth daily.   atorvastatin (LIPITOR) 80 MG tablet TAKE 1 TABLET(40 MG) BY MOUTH DAILY   busPIRone (BUSPAR) 7.5 MG tablet Take 1 tablet (7.5 mg total) by mouth 2 (two) times daily.   magnesium oxide (MAG-OX) 400 MG tablet Take 400 mg by mouth daily. (Patient not taking: Reported on 07/02/2021)   NEXIUM 40 MG capsule Take 1 capsule (40 mg total) by mouth daily at 12 noon.   OZEMPIC, 2 MG/DOSE, 8 MG/3ML SOPN inject 2663minto THE SKIN ONCE A WEEK AS DIRECTED   Polyethyl Glycol-Propyl Glycol (SYSTANE) 0.4-0.3 % SOLN Apply 1 drop to eye 4 (four) times daily as needed. In both eyes.   SYNTHROID 100 MCG tablet Take 1 tablet (100 mcg total) by mouth daily before breakfast.   tiZANidine (ZANAFLEX) 4 MG tablet Take 1 tablet (4 mg total) by mouth every 8 (eight) hours as needed for muscle spasms.   valsartan-hydrochlorothiazide (DIOVAN-HCT) 320-12.5 MG tablet Take 1 tablet by mouth daily.   Vitamin D, Ergocalciferol, (DRISDOL) 1.25 MG (50000 UNIT) CAPS capsule TAKE ONE CAPSULE BY MOUTH weekly on monday, wednesday, and friday   White Petrolatum-Mineral Oil (SYSTANE NIGHTTIME) OINT Apply 1 application to eye at bedtime.   zolpidem (AMBIEN CR) 12.5 MG CR tablet Take 1 tablet by mouth at bedtime as needed   No  facility-administered encounter medications on file as of 10/11/2021.    Reviewed chart for medication changes ahead of medication coordination call.  No Consults, or hospital visits since last care coordination call/Pharmacist visit.  BP Readings from Last 3 Encounters:  10/09/21 (!) 146/88  07/18/21 136/78  07/02/21 132/88    Lab Results  Component Value Date   HGBA1C 5.2 10/09/2021     Patient obtains medications through Adherence Packaging  30 Days   Last adherence delivery included:  Zolpidem 12.63m8m1 at bedtime prn (Bottles)  Atorvastatin 66m79mat BT Amlodipine 10mg263mt B Valsartan-HCTZ 320-12.63mg 18m B Synthroid 112mcg 64m BB Vitamin D2 1250mcg 1863mB on Monday, Wednesdays, Fridays   Patient declined (meds) last month  Ozempic 8mg/3ml 31mt is now receiving PAP Nexium 40mg- Get20mC  Magnesium Oxide 400mg-No lo52m taking  Trintellix - Pt stated she can not take this due to it making her sick. Pt is going to follow up with Dr. Cox. She deTobie Poetned me sending a message  Patient is due for next adherence delivery on: 10/23/21. Called patient and reviewed medications and coordinated delivery.  This delivery to include: Zolpidem 12.63mg- 1 at b1mime prn (Bottles)  Atorvastatin 66mg 1 at BT16modipine 10mg 1 at B V64mrtan-HCTZ 320-12.63mg 1 at B Syn62moid 100mcg 1 at BB V4min D2 1250mcg 1 at B on 66may, Wednesdays, Fridays   Patient declined the following medications  Ozempic  72m/3ml - Pt is now receiving PAP [facility]/08/[facility] 30ds Nexium 4070mGets OTC  Magnesium Oxide 400m67m longer taking  [facility]  Patient needs refills -Request sent  Zolpidem 12.5mg V32martan-HCTZ 320-12.5mg   75mfirmed delivery date of 10/23/21, advised patient that pharmacy will contact them the morning of delivery.   DaniellElray McgregorliMansoncist Assistant  [phone]

## 2021-10-14 ENCOUNTER — Other Ambulatory Visit: Payer: Self-pay

## 2021-10-14 DIAGNOSIS — I10 Essential (primary) hypertension: Secondary | ICD-10-CM

## 2021-10-14 MED ORDER — ZOLPIDEM TARTRATE ER 12.5 MG PO TBCR: EXTENDED_RELEASE_TABLET | ORAL | 5 refills | Status: DC

## 2021-10-14 MED ORDER — VALSARTAN-HYDROCHLOROTHIAZIDE 320-12.5 MG PO TABS: 1.0000 | ORAL_TABLET | Freq: Every day | ORAL | 1 refills | Status: DC

## 2021-10-14 MED ORDER — ZOLPIDEM TARTRATE ER 12.5 MG PO TBCR: EXTENDED_RELEASE_TABLET | ORAL | 3 refills | Status: DC

## 2021-10-16 ENCOUNTER — Telehealth: Payer: Medicare HMO

## 2021-10-18 ENCOUNTER — Other Ambulatory Visit: Payer: Self-pay | Admitting: Family Medicine

## 2021-10-18 ENCOUNTER — Ambulatory Visit
Admission: RE | Admit: 2021-10-18 | Discharge: 2021-10-18 | Disposition: A | Discharge status: Home / Self Care | Payer: Medicare HMO | Source: Ambulatory Visit | Attending: Family Medicine | Admitting: Family Medicine

## 2021-10-18 DIAGNOSIS — R599 Enlarged lymph nodes, unspecified: Secondary | ICD-10-CM

## 2021-10-18 DIAGNOSIS — N6320 Unspecified lump in the left breast, unspecified quadrant: Secondary | ICD-10-CM

## 2021-10-18 DIAGNOSIS — R590 Localized enlarged lymph nodes: Secondary | ICD-10-CM | POA: Diagnosis not present

## 2021-10-18 DIAGNOSIS — N6321 Unspecified lump in the left breast, upper outer quadrant: Secondary | ICD-10-CM | POA: Diagnosis not present

## 2021-10-18 DIAGNOSIS — N6032 Fibrosclerosis of left breast: Secondary | ICD-10-CM | POA: Diagnosis not present

## 2021-10-23 ENCOUNTER — Telehealth: Payer: Self-pay

## 2021-10-23 NOTE — Progress Notes (Signed)
2024 PAP renewal for Ozempic 46m has been started and uploaded to be mailed out. Pt is no longer on Trintellix and does not need PAP renewal. Called and lvm informing pt.    DElray Mcgregor CFriendshipPharmacist Assistant  [phone]

## 2021-11-07 ENCOUNTER — Telehealth: Payer: Self-pay

## 2021-11-11 ENCOUNTER — Telehealth: Payer: Self-pay

## 2021-11-11 NOTE — Progress Notes (Signed)
Chronic Care Management Pharmacy Assistant   Name: Donna Mayer  MRN: [id number] DOB: 1955-01-15   Reason for Encounter: Medication Coordination for [facility]    Recent office visits:  None  Recent consult visits:  None  Hospital visits:  None  Medications: Outpatient Encounter Medications as of 11/11/2021  Medication Sig   amLODipine (NORVASC) 10 MG tablet Take 1 tablet (10 mg total) by mouth daily.   atorvastatin (LIPITOR) 80 MG tablet TAKE 1 TABLET(40 MG) BY MOUTH DAILY   busPIRone (BUSPAR) 7.5 MG tablet Take 1 tablet (7.5 mg total) by mouth 2 (two) times daily.   magnesium oxide (MAG-OX) 400 MG tablet Take 400 mg by mouth daily. (Patient not taking: Reported on 07/02/2021)   NEXIUM 40 MG capsule Take 1 capsule (40 mg total) by mouth daily at 12 noon.   OZEMPIC, 2 MG/DOSE, 8 MG/3ML SOPN inject 3m into THE SKIN ONCE A WEEK AS DIRECTED   Polyethyl Glycol-Propyl Glycol (SYSTANE) 0.4-0.3 % SOLN Apply 1 drop to eye 4 (four) times daily as needed. In both eyes.   SYNTHROID 100 MCG tablet Take 1 tablet (100 mcg total) by mouth daily before breakfast.   tiZANidine (ZANAFLEX) 4 MG tablet Take 1 tablet (4 mg total) by mouth every 8 (eight) hours as needed for muscle spasms.   valsartan-hydrochlorothiazide (DIOVAN-HCT) 320-12.5 MG tablet Take 1 tablet by mouth daily.   Vitamin D, Ergocalciferol, (DRISDOL) 1.25 MG (50000 UNIT) CAPS capsule TAKE ONE CAPSULE BY MOUTH weekly on monday, wednesday, and friday   White Petrolatum-Mineral Oil (SYSTANE NIGHTTIME) OINT Apply 1 application to eye at bedtime.   zolpidem (AMBIEN CR) 12.5 MG CR tablet Take 1 tablet by mouth at bedtime as needed   No facility-administered encounter medications on file as of 11/11/2021.    Reviewed chart for medication changes ahead of medication coordination call.  No OVs, Consults, or hospital visits since last care coordination call/Pharmacist visit.   No medication changes indicated OR if recent visit,  treatment plan here.  BP Readings from Last 3 Encounters:  10/09/21 (!) 146/88  07/18/21 136/78  07/02/21 132/88    Lab Results  Component Value Date   HGBA1C 5.2 10/09/2021     Patient obtains medications through Adherence Packaging  30 Days   Last adherence delivery included:  Zolpidem 12.539m 1 at bedtime prn (Bottles)  Atorvastatin 8058m at BT Amlodipine 11m31mat B Valsartan-HCTZ 320-12.5mg 34mt B Synthroid 100mcg109mt BB Vitamin D2 1250mcg 3m B on Monday, Wednesdays, Fridays   Patient declined (meds) last month  Ozempic 8mg/3ml66mPt is now receiving PAP Buspirone 7.5mg-[facility] Nexium 40mg- Get56mC  Magnesium Oxide 400mg-No lo12m taking  [facility]  Patient is due for next adherence delivery on: 11/21/21. Called patient and reviewed medications and coordinated delivery.  This delivery to include: Zolpidem 12.5mg- 1 at be45mme prn (Bottles)  Atorvastatin 80mg 1 at BT 73mdipine 11mg 1 at B Va23mtan-HCTZ 320-12.5mg 1 at B Synt65mid 100mcg 1 at BB Vi82mn D2 1250mcg 1 at B on M58my, Wednesdays, Fridays   Patient declined the following medications  Ozempic 8mg/3ml - Pt is no44meceiving PAP Buspirone 7.5mg-[facility] stated she was on this for a trial basis and needs to follow up with Donna Mayer for refills and Donna Poetl make sure its sent to [facility].  Systane Eye Drops-[facility] Nexium 40mg- Gets OTC  Magn16mm  Oxide 410m-No longer taking  [facility]and[facility]mF[facility] 10/09/21 30ds  Patient needs refills  None  Confirmed delivery date of 11/21/21, advised patient that pharmacy will contact them the morning of delivery.   DaElray McgregorCMEufaulaharmacist Assistant  [phone]

## 2021-11-28 ENCOUNTER — Telehealth: Payer: Self-pay

## 2021-11-28 NOTE — Telephone Encounter (Signed)
I called patient regarding Ambien CR to tell her that it will not covered next year and she stated that [facility] had called her to let her know that if an authorization was sent then she could get Ambien CR.  Also she stated that she needs patient assistance forms as well.  Please advise

## 2021-12-11 ENCOUNTER — Other Ambulatory Visit: Payer: Self-pay

## 2021-12-11 ENCOUNTER — Telehealth: Payer: Self-pay

## 2021-12-11 DIAGNOSIS — E559 Vitamin D deficiency, unspecified: Secondary | ICD-10-CM

## 2021-12-11 MED ORDER — VITAMIN D (ERGOCALCIFEROL) 1.25 MG (50000 UNIT) PO CAPS: ORAL_CAPSULE | ORAL | 2 refills | Status: DC

## 2021-12-11 NOTE — Progress Notes (Signed)
Chronic Care Management Pharmacy Assistant   Name: Donna Mayer  MRN: [id number] DOB: 1956/01/04   Reason for Encounter: Medication Coordination for [facility]    Recent office visits:  None  Recent consult visits:  None  Hospital visits:  None  Medications: Outpatient Encounter Medications as of 12/11/2021  Medication Sig   amLODipine (NORVASC) 10 MG tablet Take 1 tablet (10 mg total) by mouth daily.   atorvastatin (LIPITOR) 80 MG tablet TAKE 1 TABLET(40 MG) BY MOUTH DAILY   busPIRone (BUSPAR) 7.5 MG tablet Take 1 tablet (7.5 mg total) by mouth 2 (two) times daily.   magnesium oxide (MAG-OX) 400 MG tablet Take 400 mg by mouth daily. (Patient not taking: Reported on 07/02/2021)   NEXIUM 40 MG capsule Take 1 capsule (40 mg total) by mouth daily at 12 noon.   OZEMPIC, 2 MG/DOSE, 8 MG/3ML SOPN inject 90m into THE SKIN ONCE A WEEK AS DIRECTED   Polyethyl Glycol-Propyl Glycol (SYSTANE) 0.4-0.3 % SOLN Apply 1 drop to eye 4 (four) times daily as needed. In both eyes.   SYNTHROID 100 MCG tablet Take 1 tablet (100 mcg total) by mouth daily before breakfast.   tiZANidine (ZANAFLEX) 4 MG tablet Take 1 tablet (4 mg total) by mouth every 8 (eight) hours as needed for muscle spasms.   valsartan-hydrochlorothiazide (DIOVAN-HCT) 320-12.5 MG tablet Take 1 tablet by mouth daily.   Vitamin D, Ergocalciferol, (DRISDOL) 1.25 MG (50000 UNIT) CAPS capsule TAKE ONE CAPSULE BY MOUTH weekly on monday, wednesday, and friday   White Petrolatum-Mineral Oil (SYSTANE NIGHTTIME) OINT Apply 1 application to eye at bedtime.   zolpidem (AMBIEN CR) 12.5 MG CR tablet Take 1 tablet by mouth at bedtime as needed   No facility-administered encounter medications on file as of 12/11/2021.    Reviewed chart for medication changes ahead of medication coordination call.  No OVs, Consults, or hospital visits since last care coordination call/Pharmacist visit.   No medication changes indicated OR if recent visit,  treatment plan here.  BP Readings from Last 3 Encounters:  10/09/21 (!) 146/88  07/18/21 136/78  07/02/21 132/88    Lab Results  Component Value Date   HGBA1C 5.2 10/09/2021     Patient obtains medications through Adherence Packaging  30 Days   Last adherence delivery included:  Zolpidem 12.520m 1 at bedtime prn (Bottles)  Atorvastatin 8044m at BT Amlodipine 36m82mat B Valsartan-HCTZ 320-12.5mg 84mt B Synthroid 100mcg17mt BB Vitamin D2 1250mcg 9m B on Monday, Wednesdays, Fridays   Patient declined (meds) last month  Ozempic 8mg/3ml31mPt is now receiving PAP Buspirone 7.5mg-[facility]86mat [facility]. Pt stated she was on this for a trial basis and needs to follow up with Dr. Cox for rTobie Poetlls and will make sure its sent to [facility].  Systane Eye Drops-[facility] Nexium 40mg- Get30mC  Magnesium Oxide 400mg-No lo40m taking  Tizandine 4mg-[facility]  Patient is due for next adherence delivery on: 12/23/21. Called patient and reviewed medications and coordinated delivery.  This delivery to include: Zolpidem 12.5mg- 1 at be6mme prn (Bottles)  Atorvastatin 80mg 1 at BT 70mdipine 36mg 1 at B Va13mtan-HCTZ 320-12.5mg 1 at B Synt51mid 100mcg 1 at BB Vi26mn D2 1250mcg 1 at B on M40my, Wednesdays, Fridays   Patient declined the following medications  Ozempic 8mg/3ml - Pt recei53m last month for 90 ds  Buspirone 7.5mg-[facility] stated she  was on this for a trial basis and needs to follow up with Dr. Tobie Poet for refills and will make sure its sent to [facility].  Systane Eye Drops-[facility] Nexium 39m- Gets OTC  Magnesium Oxide 401020mNo longer taking  [facility]andine 20[facility]69milled [facility] 10/09/21 30ds  Patient needs refills-Request Sent  Vitamin D2 1250m10mConfirmed delivery date of 12/23/21, advised patient that pharmacy will contact them the morning of delivery.   DaniElray McgregorMA Blacklick Estatesrmacist Assistant  [phone]

## 2021-12-20 ENCOUNTER — Other Ambulatory Visit: Payer: Self-pay

## 2021-12-20 ENCOUNTER — Other Ambulatory Visit: Payer: Medicare HMO

## 2021-12-20 DIAGNOSIS — E890 Postprocedural hypothyroidism: Secondary | ICD-10-CM

## 2021-12-25 LAB — TSH: TSH: 6.74 u[IU]/mL — ABNORMAL HIGH (ref 0.450–4.500)

## 2021-12-25 LAB — T4, FREE: Free T4: 1.6 ng/dL (ref 0.82–1.77)

## 2021-12-26 ENCOUNTER — Other Ambulatory Visit: Payer: Self-pay

## 2021-12-26 MED ORDER — LEVOTHYROXINE SODIUM 112 MCG PO TABS: 112.0000 ug | ORAL_TABLET | Freq: Every day | ORAL | 3 refills | Status: DC

## 2021-12-30 ENCOUNTER — Telehealth: Payer: Self-pay

## 2021-12-30 NOTE — Telephone Encounter (Signed)
Prior Authorization is available without authorization.

## 2022-01-09 ENCOUNTER — Telehealth: Payer: Self-pay

## 2022-01-09 NOTE — Progress Notes (Signed)
Chronic Care Management Pharmacy Assistant   Name: Donna Mayer  MRN: [id number] DOB: 17-Jan-1955   Reason for Encounter: Medication Coordination for [facility]    Recent office visits:  12/20/21 Meredith Mody RN. Orders Only. Recommend return to synthroid at 112 mcg once daily in am   Recent consult visits:  None  Hospital visits:  None  Medications: Outpatient Encounter Medications as of 01/09/2022  Medication Sig   amLODipine (NORVASC) 10 MG tablet Take 1 tablet (10 mg total) by mouth daily.   atorvastatin (LIPITOR) 80 MG tablet TAKE 1 TABLET(40 MG) BY MOUTH DAILY   busPIRone (BUSPAR) 7.5 MG tablet Take 1 tablet (7.5 mg total) by mouth 2 (two) times daily.   levothyroxine (SYNTHROID) 112 MCG tablet Take 1 tablet (112 mcg total) by mouth daily.   magnesium oxide (MAG-OX) 400 MG tablet Take 400 mg by mouth daily. (Patient not taking: Reported on 07/02/2021)   NEXIUM 40 MG capsule Take 1 capsule (40 mg total) by mouth daily at 12 noon.   OZEMPIC, 2 MG/DOSE, 8 MG/3ML SOPN inject 92m into THE SKIN ONCE A WEEK AS DIRECTED   Polyethyl Glycol-Propyl Glycol (SYSTANE) 0.4-0.3 % SOLN Apply 1 drop to eye 4 (four) times daily as needed. In both eyes.   tiZANidine (ZANAFLEX) 4 MG tablet Take 1 tablet (4 mg total) by mouth every 8 (eight) hours as needed for muscle spasms.   valsartan-hydrochlorothiazide (DIOVAN-HCT) 320-12.5 MG tablet Take 1 tablet by mouth daily.   Vitamin D, Ergocalciferol, (DRISDOL) 1.25 MG (50000 UNIT) CAPS capsule TAKE ONE CAPSULE BY MOUTH weekly on monday, wednesday, and friday   White Petrolatum-Mineral Oil (SYSTANE NIGHTTIME) OINT Apply 1 application to eye at bedtime.   zolpidem (AMBIEN CR) 12.5 MG CR tablet Take 1 tablet by mouth at bedtime as needed   No facility-administered encounter medications on file as of 01/09/2022.    Reviewed chart for medication changes ahead of medication coordination call.  No OVs, Consults, or hospital visits since last care  coordination call/Pharmacist visit.   BP Readings from Last 3 Encounters:  10/09/21 (!) 146/88  07/18/21 136/78  07/02/21 132/88    Lab Results  Component Value Date   HGBA1C 5.2 10/09/2021     Patient obtains medications through Adherence Packaging  30 Days   Last adherence delivery included:  Zolpidem 12.541m 1 at bedtime prn (Bottles)  Atorvastatin 8058m at BT Amlodipine 86m62mat B Valsartan-HCTZ 320-12.5mg 34mt B Synthroid 100mcg73mt BB Vitamin D2 1250mcg 62m B on Monday, Wednesdays, Fridays   Patient declined (meds) last month  Ozempic 8mg/3ml48mPt received last month for 90 ds  [facility]. Pt stated she was on this for a trial basis and needs to follow up with Dr. Cox for rTobie Poetlls and will make sure its sent to [facility].  Systane Eye Drops-[facility] Nexium 40mg- Get62mC  Magnesium Oxide 400mg-No lo49m taking  [facility]  Patient is due for next adherence delivery on: 01/21/22. Called patient and reviewed medications and coordinated delivery.  This delivery to include: Zolpidem 12.5mg- 1 at be24mme prn (Bottles)  Atorvastatin 80mg 1 at BT 29mdipine 86mg 1 at B Va22mtan-HCTZ 320-12.5mg 1 at B Synt2mid 112mcg 1 at BB (D38mchange, pt was given samples since she stopped the 100mcg and has eno65mto last her until delivery) Vitamin D2 1250mcg 1 at B on Mo45m, Wednesdays, Fridays   Patient  declined the following medications Ozempic 41m/3ml - Pt received 90ds in November  [facility]. Pt stated she was on this for a trial basis and needs to follow up with Dr. CoTobie Poetor refills and will make sure its sent to [facility].  Systane Eye Drops-[facility] Nexium 4025mGets OTC  Magnesium Oxide 400m27m longer taking  [facility]  Patient needs refills-Request Sent  Atorvastatin 80mg 21mdipine 10mg  40mthroid 100mcg  22mfirmed delivery date of 01/21/22, advised patient that pharmacy will contact them the morning of delivery.   DanielleElray McgregorinHolmes Beachist Assistant  [phone]

## 2022-01-10 NOTE — Telephone Encounter (Signed)
Compliant on meds

## 2022-01-14 ENCOUNTER — Other Ambulatory Visit: Payer: Self-pay | Admitting: Family Medicine

## 2022-01-15 ENCOUNTER — Other Ambulatory Visit: Payer: Self-pay

## 2022-01-15 ENCOUNTER — Other Ambulatory Visit: Payer: Self-pay | Admitting: Family Medicine

## 2022-01-15 MED ORDER — LEVOTHYROXINE SODIUM 112 MCG PO TABS: 112.0000 ug | ORAL_TABLET | Freq: Every day | ORAL | 0 refills | Status: DC

## 2022-01-21 ENCOUNTER — Other Ambulatory Visit: Payer: Self-pay

## 2022-01-21 MED ORDER — LEVOTHYROXINE SODIUM 112 MCG PO TABS: 112.0000 ug | ORAL_TABLET | Freq: Every day | ORAL | 0 refills | Status: DC

## 2022-01-21 MED ORDER — ZOLPIDEM TARTRATE ER 12.5 MG PO TBCR: EXTENDED_RELEASE_TABLET | ORAL | 3 refills | Status: DC

## 2022-02-07 ENCOUNTER — Telehealth: Payer: Self-pay

## 2022-02-07 NOTE — Progress Notes (Signed)
Care Management & Coordination Services Pharmacy Team  Reason for Encounter: Medication coordination and delivery  Contacted patient to discuss medications and coordinate delivery from [facility].  Cycle dispensing form sent to Pattricia Boss for review.   Last adherence delivery date:01/21/22      Patient is due for next adherence delivery on: 02/19/22  This delivery to include: Adherence Packaging  30 Days  Zolpidem 12.'5mg'$ - 1 at bedtime prn (Bottles)  Atorvastatin '80mg'$  1 at BT Amlodipine '10mg'$  1 at B Valsartan-HCTZ 320-12.'5mg'$  1 at B Synthroid 155mg 1 at BB  Vitamin D2 1256m 1 at B on Monday, Wednesdays, Fridays   Patient declined the following medications this month: Ozempic '8mg'$ /79m87m Pt received 90ds in November  Buspirone 7.'5mg'$ -Not taking Systane Eye Drops-[facility] to [facility] Nexium '40mg'$ - Gets OTC  Magnesium Oxide '400mg'$ -No longer taking  [facility] '4[facility]'$ [facility]  Refills requested from providers include: Zolpidem 12.'5mg'$  Synthroid 112m50m Confirmed delivery date of 02/19/22, advised patient that pharmacy will contact them the morning of delivery.  Any concerns about your medications? No  How often do you forget or accidentally miss a dose? Never  Do you use a pillbox? No  Is patient in packaging Yes  Recent blood pressure readings are as follows:10/09/21 146/88  Recent blood glucose readings are as follows:10/09/21 5.2   Chart review: Recent office visits:  None  Recent consult visits:  None  Hospital visits:  None  Medications: Outpatient Encounter Medications as of 02/07/2022  Medication Sig   amLODipine (NORVASC) 10 MG tablet TAKE ONE TABLET BY MOUTH ONCE DAILY   atorvastatin (LIPITOR) 80 MG tablet TAKE ONE TABLET BY MOUTH EVERYDAY AT BEDTIME   busPIRone (BUSPAR) 7.5 MG tablet Take 1 tablet (7.5 mg total) by mouth 2 (two) times daily.   levothyroxine (SYNTHROID) 112 MCG tablet Take 1 tablet (112 mcg total) by mouth daily.    magnesium oxide (MAG-OX) 400 MG tablet Take 400 mg by mouth daily. (Patient not taking: Reported on 07/02/2021)   NEXIUM 40 MG capsule Take 1 capsule (40 mg total) by mouth daily at 12 noon.   OZEMPIC, 2 MG/DOSE, 8 MG/3ML SOPN inject '2mg'$  into THE SKIN ONCE A WEEK AS DIRECTED   Polyethyl Glycol-Propyl Glycol (SYSTANE) 0.4-0.3 % SOLN Apply 1 drop to eye 4 (four) times daily as needed. In both eyes.   tiZANidine (ZANAFLEX) 4 MG tablet Take 1 tablet (4 mg total) by mouth every 8 (eight) hours as needed for muscle spasms.   valsartan-hydrochlorothiazide (DIOVAN-HCT) 320-12.5 MG tablet Take 1 tablet by mouth daily.   Vitamin D, Ergocalciferol, (DRISDOL) 1.25 MG (50000 UNIT) CAPS capsule TAKE ONE CAPSULE BY MOUTH weekly on monday, wednesday, and friday   White Petrolatum-Mineral Oil (SYSTANE NIGHTTIME) OINT Apply 1 application to eye at bedtime.   zolpidem (AMBIEN CR) 12.5 MG CR tablet Take 1 tablet by mouth at bedtime as needed   No facility-administered encounter medications on file as of 02/07/2022.   BP Readings from Last 3 Encounters:  10/09/21 (!) 146/88  07/18/21 136/78  07/02/21 132/88    Pulse Readings from Last 3 Encounters:  10/09/21 80  07/02/21 88  02/20/21 (!) 110    Lab Results  Component Value Date/Time   HGBA1C 5.2 10/09/2021 08:26 AM   HGBA1C 5.6 06/28/2021 11:05 AM   Lab Results  Component Value Date   CREATININE 0.67 10/09/2021   BUN 13 10/09/2021   GFRNONAA 96 02/16/2020   GFRAA 111 02/16/2020   NA 141  10/09/2021   K 3.8 10/09/2021   CALCIUM 9.2 10/09/2021   CO2 25 10/09/2021     Elray Mcgregor, [place] Clinical Pharmacist Assistant  [phone]

## 2022-02-09 NOTE — Assessment & Plan Note (Addendum)
The current medical regimen is effective;  continue present plan and medications.  Nexium 40 mg daily

## 2022-02-09 NOTE — Assessment & Plan Note (Signed)
Check labs 

## 2022-02-09 NOTE — Assessment & Plan Note (Signed)
Well controlled.  No changes to medicines. Norvasc 10 mg 1 daily, Clonidine 0.1 mg 1 before bed, Diovan-HCT 320/12.5 mg 1 daily. Continue to work on eating a healthy diet and exercise.  Labs drawn today.

## 2022-02-09 NOTE — Assessment & Plan Note (Signed)
Well controlled.  No changes to medicines. Lipitor 80 mg daily Continue to work on eating a healthy diet and exercise.  Labs drawn today.

## 2022-02-09 NOTE — Assessment & Plan Note (Signed)
Recommend continue to work on eating healthy diet and exercise.  Continue with Ozempic

## 2022-02-09 NOTE — Assessment & Plan Note (Signed)
Previously well controlled Continue Synthroid at current dose  Recheck TSH and adjust Synthroid as indicated   
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
## 2022-02-10 ENCOUNTER — Ambulatory Visit (INDEPENDENT_AMBULATORY_CARE_PROVIDER_SITE_OTHER): Payer: Medicare HMO | Admitting: Family Medicine

## 2022-02-10 ENCOUNTER — Other Ambulatory Visit: Payer: Self-pay

## 2022-02-10 ENCOUNTER — Encounter: Payer: Self-pay | Admitting: Family Medicine

## 2022-02-10 VITALS — BP 110/78 | HR 100 | Temp 96.2°F | Resp 18 | Ht 62.0 in | Wt 235.0 lb

## 2022-02-10 DIAGNOSIS — K219 Gastro-esophageal reflux disease without esophagitis: Secondary | ICD-10-CM

## 2022-02-10 DIAGNOSIS — E890 Postprocedural hypothyroidism: Secondary | ICD-10-CM | POA: Diagnosis not present

## 2022-02-10 DIAGNOSIS — L2082 Flexural eczema: Secondary | ICD-10-CM

## 2022-02-10 DIAGNOSIS — I5022 Chronic systolic (congestive) heart failure: Secondary | ICD-10-CM

## 2022-02-10 DIAGNOSIS — L309 Dermatitis, unspecified: Secondary | ICD-10-CM

## 2022-02-10 DIAGNOSIS — E782 Mixed hyperlipidemia: Secondary | ICD-10-CM

## 2022-02-10 DIAGNOSIS — I10 Essential (primary) hypertension: Secondary | ICD-10-CM | POA: Diagnosis not present

## 2022-02-10 DIAGNOSIS — I509 Heart failure, unspecified: Secondary | ICD-10-CM

## 2022-02-10 DIAGNOSIS — D508 Other iron deficiency anemias: Secondary | ICD-10-CM

## 2022-02-10 DIAGNOSIS — M62838 Other muscle spasm: Secondary | ICD-10-CM

## 2022-02-10 DIAGNOSIS — E559 Vitamin D deficiency, unspecified: Secondary | ICD-10-CM | POA: Diagnosis not present

## 2022-02-10 DIAGNOSIS — R7303 Prediabetes: Secondary | ICD-10-CM | POA: Diagnosis not present

## 2022-02-10 DIAGNOSIS — F5101 Primary insomnia: Secondary | ICD-10-CM

## 2022-02-10 DIAGNOSIS — I110 Hypertensive heart disease with heart failure: Secondary | ICD-10-CM | POA: Diagnosis not present

## 2022-02-10 HISTORY — DX: Other muscle spasm: M62.838

## 2022-02-10 HISTORY — DX: Dermatitis, unspecified: L30.9

## 2022-02-10 MED ORDER — VALSARTAN-HYDROCHLOROTHIAZIDE 160-12.5 MG PO TABS: 1.0000 | ORAL_TABLET | Freq: Every day | ORAL | 3 refills | Status: DC

## 2022-02-10 MED ORDER — TRIAMCINOLONE ACETONIDE 0.1 % EX CREA: 1.0000 | TOPICAL_CREAM | Freq: Two times a day (BID) | CUTANEOUS | 1 refills | Status: DC

## 2022-02-10 MED ORDER — BELSOMRA 15 MG PO TABS: 15.0000 mg | ORAL_TABLET | Freq: Every evening | ORAL | 0 refills | Status: DC | PRN

## 2022-02-10 NOTE — Patient Instructions (Signed)
Recommend try iron fusion OTC

## 2022-02-10 NOTE — Assessment & Plan Note (Signed)
Continue ambien cr until almost out and then try belsomra 15 mg before bed.

## 2022-02-10 NOTE — Assessment & Plan Note (Signed)
Trial on triamcinalone cream twice daily x 1 week.

## 2022-02-11 ENCOUNTER — Other Ambulatory Visit: Payer: Self-pay

## 2022-02-11 LAB — COMPREHENSIVE METABOLIC PANEL
ALT: 8 IU/L (ref 0–32)
AST: 13 IU/L (ref 0–40)
Albumin/Globulin Ratio: 1.5 (ref 1.2–2.2)
Albumin: 4.5 g/dL (ref 3.9–4.9)
Alkaline Phosphatase: 92 IU/L (ref 44–121)
BUN/Creatinine Ratio: 23 (ref 12–28)
BUN: 16 mg/dL (ref 8–27)
Bilirubin Total: 0.6 mg/dL (ref 0.0–1.2)
CO2: 22 mmol/L (ref 20–29)
Calcium: 9.4 mg/dL (ref 8.7–10.3)
Chloride: 98 mmol/L (ref 96–106)
Creatinine, Ser: 0.71 mg/dL (ref 0.57–1.00)
Globulin, Total: 3 g/dL (ref 1.5–4.5)
Glucose: 80 mg/dL (ref 70–99)
Potassium: 3.9 mmol/L (ref 3.5–5.2)
Sodium: 137 mmol/L (ref 134–144)
Total Protein: 7.5 g/dL (ref 6.0–8.5)
eGFR: 95 mL/min/{1.73_m2} (ref >59)

## 2022-02-11 LAB — CBC WITH DIFFERENTIAL/PLATELET
Basophils Absolute: 0 10*3/uL (ref 0.0–0.2)
Basos: 0 %
EOS (ABSOLUTE): 0.1 10*3/uL (ref 0.0–0.4)
Eos: 1 %
Hematocrit: 33.2 % — ABNORMAL LOW (ref 34.0–46.6)
Hemoglobin: 10.7 g/dL — ABNORMAL LOW (ref 11.1–15.9)
Immature Grans (Abs): 0 10*3/uL (ref 0.0–0.1)
Immature Granulocytes: 0 %
Lymphocytes Absolute: 1 10*3/uL (ref 0.7–3.1)
Lymphs: 21 %
MCH: 24.9 pg — ABNORMAL LOW (ref 26.6–33.0)
MCHC: 32.2 g/dL (ref 31.5–35.7)
MCV: 77 fL — ABNORMAL LOW (ref 79–97)
Monocytes Absolute: 0.5 10*3/uL (ref 0.1–0.9)
Monocytes: 9 %
Neutrophils Absolute: 3.4 10*3/uL (ref 1.4–7.0)
Neutrophils: 69 %
Platelets: 275 10*3/uL (ref 150–450)
RBC: 4.29 x10E6/uL (ref 3.77–5.28)
RDW: 14.3 % (ref 11.7–15.4)
WBC: 5 10*3/uL (ref 3.4–10.8)

## 2022-02-11 LAB — LIPID PANEL
Chol/HDL Ratio: 2.5 ratio (ref 0.0–4.4)
Cholesterol, Total: 204 mg/dL — ABNORMAL HIGH (ref 100–199)
HDL: 81 mg/dL (ref >39)
LDL Chol Calc (NIH): 113 mg/dL — ABNORMAL HIGH (ref 0–99)
Triglycerides: 56 mg/dL (ref 0–149)
VLDL Cholesterol Cal: 10 mg/dL (ref 5–40)

## 2022-02-11 LAB — CARDIOVASCULAR RISK ASSESSMENT

## 2022-02-11 LAB — PHOSPHORUS: Phosphorus: 3.6 mg/dL (ref 3.0–4.3)

## 2022-02-11 LAB — HEMOGLOBIN A1C
Est. average glucose Bld gHb Est-mCnc: 111 mg/dL
Hgb A1c MFr Bld: 5.5 % (ref 4.8–5.6)

## 2022-02-11 LAB — MAGNESIUM: Magnesium: 1.4 mg/dL — ABNORMAL LOW (ref 1.6–2.3)

## 2022-02-11 LAB — TSH: TSH: 1.42 u[IU]/mL (ref 0.450–4.500)

## 2022-02-11 LAB — VITAMIN D 25 HYDROXY (VIT D DEFICIENCY, FRACTURES): Vit D, 25-Hydroxy: 64.5 ng/mL (ref 30.0–100.0)

## 2022-02-11 MED ORDER — ZOLPIDEM TARTRATE ER 12.5 MG PO TBCR: EXTENDED_RELEASE_TABLET | ORAL | 3 refills | Status: DC

## 2022-02-11 NOTE — Telephone Encounter (Signed)
Patient picked up [facility]othy[facility]ine from [facility] but did not pick up the Zolpidem, [facility].

## 2022-02-16 NOTE — Assessment & Plan Note (Signed)
Found on echocardiogram in 2021.

## 2022-02-19 ENCOUNTER — Telehealth: Payer: Self-pay

## 2022-02-19 NOTE — Telephone Encounter (Signed)
Zolpidem ER 12.5 mg was approved for [facility] under part D benefit.

## 2022-03-11 ENCOUNTER — Telehealth: Payer: Self-pay

## 2022-03-11 NOTE — Progress Notes (Signed)
Care Management & Coordination Services Pharmacy Team  Reason for Encounter: Medication coordination and delivery  Contacted patient to discuss medications and coordinate delivery from [facility]. Spoke with patient on 03/11/2022  Cycle dispensing form sent to Pattricia Boss for review.   Last adherence delivery date: 02-19-2022    Patient is due for next adherence delivery on: 03-21-2022  This delivery to include: Adherence Packaging  30 Days  Zolpidem 12.'5mg'$ - 1 at bedtime prn (Bottles)  Atorvastatin '80mg'$  1 at BT Amlodipine '10mg'$  1 at B Valsartan-HCTZ 160-12.'5mg'$  1 at B Levothyroxine 125mg 1 at BB  Vitamin D2 12539m 1 at B on Monday, Wednesdays, Fridays  Kenelog cream 0.10 % twice daily Systane- ADD TO DELIVERY  Patient declined the following medications this month: Kenelog cream- Plenty supply Levothyroxine- Filled at [facility] 01-22-2022 90 DS Ozempic '8mg'$ /46m57m PAP Nexium '40mg'$ - Gets OTC  Magnesium Oxide- OTC Tizandine- Plenty supply Suvorexant- Samples given at PCP  No refill request needed.  Confirmed delivery date of 03-21-2022, advised patient that pharmacy will contact them the morning of delivery.   Any concerns about your medications? No  How often do you forget or accidentally miss a dose? Never  Do you use a pillbox? No  Is patient in packaging Yes  If yes  What is the date on your next pill pack? 03-12-2022  Any concerns or issues with your packaging? No   Recent blood pressure readings are as follows: 02-27 136/72, 02-26 132/78  Recent blood glucose readings are as follows: Patient doesn't check    Chart review: Recent office visits:  02-10-2022 [facility], KElnita MaxwellD. Hemo= 10.7, Hema= 33.2, MCV= 77, MCH= 24.9. Cholesterol= 204, LDL= 113. Magnesium= 1.4. START Suvorexant 15 mg at bedtime PRN, Kenelog 0.10000000application twice daily. STOP buspirone. DECREASE valsartan/HCTZ 320-12.5 mg daily to 160 mg-12.5 mg daily.  Recent consult visits:   None  Hospital visits:  None in previous 6 months  Medications: Outpatient Encounter Medications as of 03/11/2022  Medication Sig   amLODipine (NORVASC) 10 MG tablet TAKE ONE TABLET BY MOUTH ONCE DAILY   atorvastatin (LIPITOR) 80 MG tablet TAKE ONE TABLET BY MOUTH EVERYDAY AT BEDTIME   levothyroxine (SYNTHROID) 112 MCG tablet Take 1 tablet (112 mcg total) by mouth daily.   magnesium oxide (MAG-OX) 400 MG tablet Take 400 mg by mouth daily. (Patient not taking: Reported on 07/02/2021)   NEXIUM 40 MG capsule Take 1 capsule (40 mg total) by mouth daily at 12 noon.   OZEMPIC, 2 MG/DOSE, 8 MG/3ML SOPN inject '2mg'$  into THE SKIN ONCE A WEEK AS DIRECTED   Polyethyl Glycol-Propyl Glycol (SYSTANE) 0.4-0.3 % SOLN Apply 1 drop to eye 4 (four) times daily as needed. In both eyes.   Suvorexant (BELSOMRA) 15 MG TABS Take 1 tablet (15 mg total) by mouth at bedtime as needed.   tiZANidine (ZANAFLEX) 4 MG tablet Take 1 tablet (4 mg total) by mouth every 8 (eight) hours as needed for muscle spasms.   triamcinolone cream (KENALOG) 0.1 % Apply 1 Application topically 2 (two) times daily.   valsartan-hydrochlorothiazide (DIOVAN-HCT) 160-12.5 MG tablet Take 1 tablet by mouth daily.   Vitamin D, Ergocalciferol, (DRISDOL) 1.25 MG (50000 UNIT) CAPS capsule TAKE ONE CAPSULE BY MOUTH weekly on monday, wednesday, and friday   White Petrolatum-Mineral Oil (SYSTANE NIGHTTIME) OINT Apply 1 application to eye at bedtime.   zolpidem (AMBIEN CR) 12.5 MG CR tablet Take 1 tablet by mouth at bedtime as needed   No facility-administered encounter medications on  file as of 03/11/2022.   BP Readings from Last 3 Encounters:  02/10/22 110/78  10/09/21 (!) 146/88  07/18/21 136/78    Pulse Readings from Last 3 Encounters:  02/10/22 100  10/09/21 80  07/02/21 88    Lab Results  Component Value Date/Time   HGBA1C 5.5 02/10/2022 08:28 AM   HGBA1C 5.2 10/09/2021 08:26 AM   Lab Results  Component Value Date   CREATININE 0.71  02/10/2022   BUN 16 02/10/2022   GFRNONAA 96 02/16/2020   GFRAA 111 02/16/2020   NA 137 02/10/2022   K 3.9 02/10/2022   CALCIUM 9.4 02/10/2022   CO2 22 02/10/2022     [facility] Clinical Pharmacist Assistant [phone]

## 2022-03-19 ENCOUNTER — Other Ambulatory Visit: Payer: Self-pay | Admitting: Family Medicine

## 2022-03-19 DIAGNOSIS — E559 Vitamin D deficiency, unspecified: Secondary | ICD-10-CM

## 2022-04-09 ENCOUNTER — Telehealth: Payer: Self-pay

## 2022-04-09 NOTE — Progress Notes (Signed)
Care Management & Coordination Services Pharmacy Team  Reason for Encounter: Medication coordination and delivery  Contacted patient to discuss medications and coordinate delivery from [facility]. Unsuccessful outreach. Left voicemail for patient to return call. Cycle dispensing form sent to Arizona Constable for review.   Last adherence delivery date: 03-21-2022  Patient is due for next adherence delivery on: 04-21-2022  This delivery to include: Adherence Packaging  30 Days  Zolpidem 12.5mg - 1 at bedtime prn (Bottles)  Atorvastatin 80mg  1 at BT Amlodipine 10mg  1 at B Valsartan-HCTZ 160-12.5mg  1 at B Vitamin D2 1215mcg 1 at B on Monday, Wednesdays, Fridays  Systane  Patient declined the following medications this month: Unable to reach patient  No refill request needed.  Delivery scheduled for 04-21-2022. Unable to speak with patient to confirm date.   Chart review: Recent office visits:  None  Recent consult visits:  None  Hospital visits:  None in previous 6 months  Medications: Outpatient Encounter Medications as of 04/09/2022  Medication Sig   amLODipine (NORVASC) 10 MG tablet TAKE ONE TABLET BY MOUTH ONCE DAILY   atorvastatin (LIPITOR) 80 MG tablet TAKE ONE TABLET BY MOUTH EVERYDAY AT BEDTIME   levothyroxine (SYNTHROID) 112 MCG tablet Take 1 tablet (112 mcg total) by mouth daily.   magnesium oxide (MAG-OX) 400 MG tablet Take 400 mg by mouth daily. (Patient not taking: Reported on 07/02/2021)   NEXIUM 40 MG capsule Take 1 capsule (40 mg total) by mouth daily at 12 noon.   OZEMPIC, 2 MG/DOSE, 8 MG/3ML SOPN inject 2mg  into THE SKIN ONCE A WEEK AS DIRECTED   Polyethyl Glycol-Propyl Glycol (SYSTANE) 0.4-0.3 % SOLN Apply 1 drop to eye 4 (four) times daily as needed. In both eyes.   Suvorexant (BELSOMRA) 15 MG TABS Take 1 tablet (15 mg total) by mouth at bedtime as needed.   tiZANidine (ZANAFLEX) 4 MG tablet Take 1 tablet (4 mg total) by mouth every 8 (eight) hours  as needed for muscle spasms.   triamcinolone cream (KENALOG) 0.1 % Apply 1 Application topically 2 (two) times daily.   valsartan-hydrochlorothiazide (DIOVAN-HCT) 160-12.5 MG tablet Take 1 tablet by mouth daily.   Vitamin D, Ergocalciferol, (DRISDOL) 1.25 MG (50000 UNIT) CAPS capsule TAKE ONE CAPSULE BY MOUTH weekly on monday, wednesday, and friday   White Petrolatum-Mineral Oil (SYSTANE NIGHTTIME) OINT Apply 1 application to eye at bedtime.   zolpidem (AMBIEN CR) 12.5 MG CR tablet Take 1 tablet by mouth at bedtime as needed   No facility-administered encounter medications on file as of 04/09/2022.   BP Readings from Last 3 Encounters:  02/10/22 110/78  10/09/21 (!) 146/88  07/18/21 136/78    Pulse Readings from Last 3 Encounters:  02/10/22 100  10/09/21 80  07/02/21 88    Lab Results  Component Value Date/Time   HGBA1C 5.5 02/10/2022 08:28 AM   HGBA1C 5.2 10/09/2021 08:26 AM   Lab Results  Component Value Date   CREATININE 0.71 02/10/2022   BUN 16 02/10/2022   GFRNONAA 96 02/16/2020   GFRAA 111 02/16/2020   NA 137 02/10/2022   K 3.9 02/10/2022   CALCIUM 9.4 02/10/2022   CO2 22 02/10/2022   04-09-2022: 1st attempt left VM 04-10-2022: 2nd attempt left VM 04-11-2022: 3rd attempt left VM  [facility] Clinical Pharmacist Assistant [phone]

## 2022-04-14 NOTE — Telephone Encounter (Cosign Needed)
Yes patient didn't answer

## 2022-04-16 ENCOUNTER — Other Ambulatory Visit: Payer: Self-pay | Admitting: Family Medicine

## 2022-05-09 ENCOUNTER — Telehealth: Payer: Self-pay

## 2022-05-09 NOTE — Progress Notes (Unsigned)
Care Management & Coordination Services Pharmacy Team  Reason for Encounter: Medication coordination and delivery  Contacted patient to discuss medications and coordinate delivery from [facility]. {US HC Outreach:28874} Cycle dispensing form sent to *** for review.   Last adherence delivery date: 04-21-2022  Patient is due for next adherence delivery on: 05-21-2022   This delivery to include: Adherence Packaging  30 Days  Zolpidem 12.5mg - 1 at bedtime prn (Bottles)  Atorvastatin 80mg  1 at BT Amlodipine 10mg  1 at B Valsartan-HCTZ 160-12.5mg  1 at B Vitamin D2 1 at B on Monday, Wednesdays, Fridays  Systane Synthroid 112 mcg daily before breakfast  Patient declined the following medications this month: ***  No refill request needed.  {Delivery ZOXW:96045}   Any concerns about your medications? {yes/no:20286}  How often do you forget or accidentally miss a dose? {Missed doses:25554}  Do you use a pillbox? {yes/no:20286}  Is patient in packaging {yes/no:20286}  If yes  What is the date on your next pill pack?  Any concerns or issues with your packaging?   Recent blood pressure readings are as follows:***  Recent blood glucose readings are as follows:***   Chart review: Recent office visits:  None  Recent consult visits:  None  Hospital visits:  None in previous 6 months  Medications: Outpatient Encounter Medications as of 05/09/2022  Medication Sig   amLODipine (NORVASC) 10 MG tablet TAKE ONE TABLET BY MOUTH ONCE DAILY   atorvastatin (LIPITOR) 80 MG tablet TAKE ONE TABLET BY MOUTH EVERYDAY AT BEDTIME   levothyroxine (SYNTHROID) 112 MCG tablet TAKE ONE TABLET BY MOUTH BEFORE BREAKFAST   magnesium oxide (MAG-OX) 400 MG tablet Take 400 mg by mouth daily. (Patient not taking: Reported on 07/02/2021)   NEXIUM 40 MG capsule Take 1 capsule (40 mg total) by mouth daily at 12 noon.   OZEMPIC, 2 MG/DOSE, 8 MG/3ML SOPN inject 2mg  into THE SKIN ONCE A WEEK  AS DIRECTED   Polyethyl Glycol-Propyl Glycol (SYSTANE) 0.4-0.3 % SOLN Apply 1 drop to eye 4 (four) times daily as needed. In both eyes.   Suvorexant (BELSOMRA) 15 MG TABS Take 1 tablet (15 mg total) by mouth at bedtime as needed.   tiZANidine (ZANAFLEX) 4 MG tablet Take 1 tablet (4 mg total) by mouth every 8 (eight) hours as needed for muscle spasms.   triamcinolone cream (KENALOG) 0.1 % Apply 1 Application topically 2 (two) times daily.   valsartan-hydrochlorothiazide (DIOVAN-HCT) 160-12.5 MG tablet Take 1 tablet by mouth daily.   Vitamin D, Ergocalciferol, (DRISDOL) 1.25 MG (50000 UNIT) CAPS capsule TAKE ONE CAPSULE BY MOUTH weekly on monday, wednesday, and friday   White Petrolatum-Mineral Oil (SYSTANE NIGHTTIME) OINT Apply 1 application to eye at bedtime.   zolpidem (AMBIEN CR) 12.5 MG CR tablet Take 1 tablet by mouth at bedtime as needed   No facility-administered encounter medications on file as of 05/09/2022.   BP Readings from Last 3 Encounters:  02/10/22 110/78  10/09/21 (!) 146/88  07/18/21 136/78    Pulse Readings from Last 3 Encounters:  02/10/22 100  10/09/21 80  07/02/21 88    Lab Results  Component Value Date/Time   HGBA1C 5.5 02/10/2022 08:28 AM   HGBA1C 5.2 10/09/2021 08:26 AM   Lab Results  Component Value Date   CREATININE 0.71 02/10/2022   BUN 16 02/10/2022   GFRNONAA 96 02/16/2020   GFRAA 111 02/16/2020   NA 137 02/10/2022   K 3.9 02/10/2022   CALCIUM 9.4 02/10/2022   CO2 22 02/10/2022  05-09-2022: 1st attempt left VM  [place] Clinical Pharmacist Assistant [phone]

## 2022-06-12 ENCOUNTER — Other Ambulatory Visit: Payer: Self-pay

## 2022-06-12 ENCOUNTER — Telehealth: Payer: Self-pay

## 2022-06-12 DIAGNOSIS — E559 Vitamin D deficiency, unspecified: Secondary | ICD-10-CM

## 2022-06-12 MED ORDER — ZOLPIDEM TARTRATE ER 12.5 MG PO TBCR: EXTENDED_RELEASE_TABLET | ORAL | 3 refills | Status: DC

## 2022-06-12 MED ORDER — VITAMIN D (ERGOCALCIFEROL) 1.25 MG (50000 UNIT) PO CAPS: ORAL_CAPSULE | ORAL | 2 refills | Status: DC

## 2022-06-12 NOTE — Assessment & Plan Note (Signed)
Continue ambien cr

## 2022-06-12 NOTE — Assessment & Plan Note (Signed)
Well controlled.  No changes to medicines. Norvasc 10 mg 1 daily, Clonidine 0.1 mg 1 before bed, Diovan-HCT 320/12.5 mg 1 daily. Continue to work on eating a healthy diet and exercise.  Labs drawn today.   

## 2022-06-12 NOTE — Assessment & Plan Note (Signed)
The current medical regimen is effective;  continue present plan and medications.  

## 2022-06-12 NOTE — Assessment & Plan Note (Signed)
Well controlled.  No changes to medicines.  Lipitor 80 mg daily Continue to work on eating a healthy diet and exercise.  Labs drawn today.   

## 2022-06-12 NOTE — Assessment & Plan Note (Signed)
Recommend continue to work on eating healthy diet and exercise.  Continue with Ozempic 

## 2022-06-12 NOTE — Progress Notes (Cosign Needed Addendum)
Care Management & Coordination Services Pharmacy Team  Reason for Encounter: Medication coordination and delivery  Contacted patient to discuss medications and coordinate delivery from [facility]. Spoke with patient on 06/16/2022  Cycle dispensing form sent to Artelia Laroche for review.   Last adherence delivery date: 05-21-2022  Patient is due for next adherence delivery on: 06-20-2022  This delivery to include: Adherence Packaging  30 Days  Zolpidem 12.5mg - 1 at bedtime prn (Bottles)- On 06-07 [facility] informed me that med is on back order. Left patient a VM informing her and that we can have her previous pharmacy [facility] fill. Sent message to [facility] to send RX. Spoke with oncall nurse who is unable to send in rx since its a controlled substance and has to be faxed in. Patient was informed that medication can't be filled until Monday. Atorvastatin 80mg  1 at BT Amlodipine 10mg  1 at B Valsartan-HCTZ 160-12.5mg  1 at B Vitamin D2 1 at B on Monday, Wednesdays, Fridays  Synthroid 112 mcg daily before breakfast  Patient declined the following medications this month: Systane- Plenty supply Ozempic 8mg /21ml - PAP Nexium 40mg - Gets OTC  Magnesium Oxide- OTC Tizandine- Plenty supply Suvorexant- Samples given at PCP. Patient stated no longer taking  Refills requested from providers include: Zolpidem  Vitamin D2   Confirmed delivery date of 06-20-2022, advised patient that pharmacy will contact them the morning of delivery.  Any concerns about your medications? No  How often do you forget or accidentally miss a dose? Never  Do you use a pillbox? No  Is patient in packaging Yes  Any concerns or issues with your packaging? no   Recent blood pressure readings are as follows: None available  Recent blood glucose readings are as follows:None available   Chart review: Recent office visits:  None  Recent consult visits:  None  Hospital visits:  None in previous  6 months  Medications: Outpatient Encounter Medications as of 06/12/2022  Medication Sig   amLODipine (NORVASC) 10 MG tablet TAKE ONE TABLET BY MOUTH ONCE DAILY   atorvastatin (LIPITOR) 80 MG tablet TAKE ONE TABLET BY MOUTH EVERYDAY AT BEDTIME   levothyroxine (SYNTHROID) 112 MCG tablet TAKE ONE TABLET BY MOUTH BEFORE BREAKFAST   magnesium oxide (MAG-OX) 400 MG tablet Take 400 mg by mouth daily. (Patient not taking: Reported on 07/02/2021)   NEXIUM 40 MG capsule Take 1 capsule (40 mg total) by mouth daily at 12 noon.   OZEMPIC, 2 MG/DOSE, 8 MG/3ML SOPN inject 2mg  into THE SKIN ONCE A WEEK AS DIRECTED   Polyethyl Glycol-Propyl Glycol (SYSTANE) 0.4-0.3 % SOLN Apply 1 drop to eye 4 (four) times daily as needed. In both eyes.   Suvorexant (BELSOMRA) 15 MG TABS Take 1 tablet (15 mg total) by mouth at bedtime as needed.   tiZANidine (ZANAFLEX) 4 MG tablet Take 1 tablet (4 mg total) by mouth every 8 (eight) hours as needed for muscle spasms.   triamcinolone cream (KENALOG) 0.1 % Apply 1 Application topically 2 (two) times daily.   valsartan-hydrochlorothiazide (DIOVAN-HCT) 160-12.5 MG tablet Take 1 tablet by mouth daily.   Vitamin D, Ergocalciferol, (DRISDOL) 1.25 MG (50000 UNIT) CAPS capsule TAKE ONE CAPSULE BY MOUTH weekly on monday, wednesday, and friday   White Petrolatum-Mineral Oil (SYSTANE NIGHTTIME) OINT Apply 1 application to eye at bedtime.   zolpidem (AMBIEN CR) 12.5 MG CR tablet Take 1 tablet by mouth at bedtime as needed   No facility-administered encounter medications on file as of 06/12/2022.   BP  Readings from Last 3 Encounters:  02/10/22 110/78  10/09/21 (!) 146/88  07/18/21 136/78    Pulse Readings from Last 3 Encounters:  02/10/22 100  10/09/21 80  07/02/21 88    Lab Results  Component Value Date/Time   HGBA1C 5.5 02/10/2022 08:28 AM   HGBA1C 5.2 10/09/2021 08:26 AM   Lab Results  Component Value Date   CREATININE 0.71 02/10/2022   BUN 16 02/10/2022   GFRNONAA 96  02/16/2020   GFRAA 111 02/16/2020   NA 137 02/10/2022   K 3.9 02/10/2022   CALCIUM 9.4 02/10/2022   CO2 22 02/10/2022     [facility] CMA Clinical Pharmacist Assistant [phone]

## 2022-06-12 NOTE — Assessment & Plan Note (Signed)
Previously well controlled Continue Synthroid at current dose  Recheck TSH and adjust Synthroid as indicated   

## 2022-06-12 NOTE — Assessment & Plan Note (Signed)
The current medical regimen is effective;  continue present plan and medications.  Nexium 40 mg daily 
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
## 2022-06-12 NOTE — Assessment & Plan Note (Signed)
Found on echocardiogram in 2021. 

## 2022-06-13 ENCOUNTER — Encounter: Payer: Self-pay | Admitting: Family Medicine

## 2022-06-13 ENCOUNTER — Ambulatory Visit (INDEPENDENT_AMBULATORY_CARE_PROVIDER_SITE_OTHER): Payer: Medicare HMO | Admitting: Family Medicine

## 2022-06-13 VITALS — BP 124/80 | HR 73 | Temp 97.2°F | Ht 62.0 in | Wt 240.0 lb

## 2022-06-13 DIAGNOSIS — I110 Hypertensive heart disease with heart failure: Secondary | ICD-10-CM

## 2022-06-13 DIAGNOSIS — E66813 Obesity, class 3: Secondary | ICD-10-CM

## 2022-06-13 DIAGNOSIS — F5101 Primary insomnia: Secondary | ICD-10-CM

## 2022-06-13 DIAGNOSIS — K219 Gastro-esophageal reflux disease without esophagitis: Secondary | ICD-10-CM

## 2022-06-13 DIAGNOSIS — R7303 Prediabetes: Secondary | ICD-10-CM | POA: Diagnosis not present

## 2022-06-13 DIAGNOSIS — Z6841 Body Mass Index (BMI) 40.0 and over, adult: Secondary | ICD-10-CM

## 2022-06-13 DIAGNOSIS — I10 Essential (primary) hypertension: Secondary | ICD-10-CM

## 2022-06-13 DIAGNOSIS — E559 Vitamin D deficiency, unspecified: Secondary | ICD-10-CM

## 2022-06-13 DIAGNOSIS — F331 Major depressive disorder, recurrent, moderate: Secondary | ICD-10-CM

## 2022-06-13 DIAGNOSIS — E890 Postprocedural hypothyroidism: Secondary | ICD-10-CM | POA: Diagnosis not present

## 2022-06-13 DIAGNOSIS — I5022 Chronic systolic (congestive) heart failure: Secondary | ICD-10-CM | POA: Diagnosis not present

## 2022-06-13 DIAGNOSIS — E782 Mixed hyperlipidemia: Secondary | ICD-10-CM | POA: Diagnosis not present

## 2022-06-13 HISTORY — DX: Vitamin D deficiency, unspecified: E55.9

## 2022-06-13 HISTORY — DX: Body Mass Index (BMI) 40.0 and over, adult: Z684

## 2022-06-13 HISTORY — DX: Obesity, class 3: E66.813

## 2022-06-13 MED ORDER — SERTRALINE HCL 50 MG PO TABS: 50.0000 mg | ORAL_TABLET | Freq: Every day | ORAL | 0 refills | Status: DC

## 2022-06-13 NOTE — Assessment & Plan Note (Signed)
Refer to [employer].

## 2022-06-13 NOTE — Assessment & Plan Note (Signed)
Check labs 

## 2022-06-13 NOTE — Patient Instructions (Signed)
Start sertraline 50 mg daily.   Refer to [employer].

## 2022-06-13 NOTE — Assessment & Plan Note (Signed)
Start sertraline 50mg daily

## 2022-06-14 LAB — CBC WITH DIFFERENTIAL/PLATELET
Basophils Absolute: 0 x10E3/uL (ref 0.0–0.2)
Basos: 1 %
EOS (ABSOLUTE): 0.1 x10E3/uL (ref 0.0–0.4)
Eos: 2 %
Hematocrit: 33 % — ABNORMAL LOW (ref 34.0–46.6)
Hemoglobin: 10.4 g/dL — ABNORMAL LOW (ref 11.1–15.9)
Immature Grans (Abs): 0 x10E3/uL (ref 0.0–0.1)
Immature Granulocytes: 0 %
Lymphocytes Absolute: 0.8 x10E3/uL (ref 0.7–3.1)
Lymphs: 18 %
MCH: 25.1 pg — ABNORMAL LOW (ref 26.6–33.0)
MCHC: 31.5 g/dL (ref 31.5–35.7)
MCV: 80 fL (ref 79–97)
Monocytes Absolute: 0.4 x10E3/uL (ref 0.1–0.9)
Monocytes: 9 %
Neutrophils Absolute: 3.1 x10E3/uL (ref 1.4–7.0)
Neutrophils: 70 %
Platelets: 253 x10E3/uL (ref 150–450)
RBC: 4.15 x10E6/uL (ref 3.77–5.28)
RDW: 14.6 % (ref 11.7–15.4)
WBC: 4.4 x10E3/uL (ref 3.4–10.8)

## 2022-06-14 LAB — T4, FREE: Free T4: 1.66 ng/dL (ref 0.82–1.77)

## 2022-06-14 LAB — COMPREHENSIVE METABOLIC PANEL
ALT: 9 IU/L (ref 0–32)
AST: 16 IU/L (ref 0–40)
Albumin/Globulin Ratio: 1.4 (ref 1.2–2.2)
Albumin: 4.3 g/dL (ref 3.9–4.9)
Alkaline Phosphatase: 101 IU/L (ref 44–121)
BUN/Creatinine Ratio: 16 (ref 12–28)
BUN: 14 mg/dL (ref 8–27)
Bilirubin Total: 0.6 mg/dL (ref 0.0–1.2)
CO2: 22 mmol/L (ref 20–29)
Calcium: 9.4 mg/dL (ref 8.7–10.3)
Chloride: 98 mmol/L (ref 96–106)
Creatinine, Ser: 0.9 mg/dL (ref 0.57–1.00)
Globulin, Total: 3.1 g/dL (ref 1.5–4.5)
Glucose: 77 mg/dL (ref 70–99)
Potassium: 4.1 mmol/L (ref 3.5–5.2)
Sodium: 136 mmol/L (ref 134–144)
Total Protein: 7.4 g/dL (ref 6.0–8.5)
eGFR: 71 mL/min/{1.73_m2} (ref >59)

## 2022-06-14 LAB — LIPID PANEL
Chol/HDL Ratio: 2.1 ratio (ref 0.0–4.4)
Cholesterol, Total: 201 mg/dL — ABNORMAL HIGH (ref 100–199)
HDL: 97 mg/dL (ref >39)
LDL Chol Calc (NIH): 93 mg/dL (ref 0–99)
Triglycerides: 57 mg/dL (ref 0–149)
VLDL Cholesterol Cal: 11 mg/dL (ref 5–40)

## 2022-06-14 LAB — HEMOGLOBIN A1C
Est. average glucose Bld gHb Est-mCnc: 117 mg/dL
Hgb A1c MFr Bld: 5.7 % — ABNORMAL HIGH (ref 4.8–5.6)

## 2022-06-14 LAB — VITAMIN D 25 HYDROXY (VIT D DEFICIENCY, FRACTURES): Vit D, 25-Hydroxy: 63 ng/mL (ref 30.0–100.0)

## 2022-06-14 LAB — TSH: TSH: 2.52 u[IU]/mL (ref 0.450–4.500)

## 2022-06-14 LAB — CARDIOVASCULAR RISK ASSESSMENT

## 2022-06-18 ENCOUNTER — Other Ambulatory Visit: Payer: Self-pay

## 2022-06-18 MED ORDER — SERTRALINE HCL 50 MG PO TABS: 50.0000 mg | ORAL_TABLET | Freq: Every day | ORAL | 0 refills | Status: DC

## 2022-06-18 NOTE — Telephone Encounter (Cosign Needed)
She picked med up from [facility] on 05-31 90 DS no refills remaining. Will req rx when due

## 2022-06-19 ENCOUNTER — Other Ambulatory Visit: Payer: Self-pay

## 2022-06-20 ENCOUNTER — Other Ambulatory Visit: Payer: Self-pay | Admitting: Family Medicine

## 2022-06-26 DIAGNOSIS — R5383 Other fatigue: Secondary | ICD-10-CM | POA: Diagnosis not present

## 2022-06-26 DIAGNOSIS — E8889 Other specified metabolic disorders: Secondary | ICD-10-CM | POA: Diagnosis not present

## 2022-06-26 DIAGNOSIS — Z6841 Body Mass Index (BMI) 40.0 and over, adult: Secondary | ICD-10-CM | POA: Diagnosis not present

## 2022-06-26 DIAGNOSIS — E785 Hyperlipidemia, unspecified: Secondary | ICD-10-CM | POA: Diagnosis not present

## 2022-06-26 DIAGNOSIS — I10 Essential (primary) hypertension: Secondary | ICD-10-CM | POA: Diagnosis not present

## 2022-06-26 DIAGNOSIS — Z1331 Encounter for screening for depression: Secondary | ICD-10-CM | POA: Diagnosis not present

## 2022-06-28 DIAGNOSIS — H2513 Age-related nuclear cataract, bilateral: Secondary | ICD-10-CM | POA: Diagnosis not present

## 2022-06-28 DIAGNOSIS — Z0100 Encounter for examination of eyes and vision without abnormal findings: Secondary | ICD-10-CM | POA: Diagnosis not present

## 2022-07-11 ENCOUNTER — Other Ambulatory Visit: Payer: Self-pay | Admitting: Family Medicine

## 2022-07-16 DIAGNOSIS — R7303 Prediabetes: Secondary | ICD-10-CM | POA: Diagnosis not present

## 2022-07-16 DIAGNOSIS — Z6841 Body Mass Index (BMI) 40.0 and over, adult: Secondary | ICD-10-CM | POA: Diagnosis not present

## 2022-07-21 ENCOUNTER — Other Ambulatory Visit: Payer: Self-pay

## 2022-07-21 MED ORDER — ZOLPIDEM TARTRATE ER 12.5 MG PO TBCR: EXTENDED_RELEASE_TABLET | ORAL | 5 refills | Status: DC

## 2022-07-30 DIAGNOSIS — Z6841 Body Mass Index (BMI) 40.0 and over, adult: Secondary | ICD-10-CM | POA: Diagnosis not present

## 2022-07-30 DIAGNOSIS — R7303 Prediabetes: Secondary | ICD-10-CM | POA: Diagnosis not present

## 2022-08-18 ENCOUNTER — Other Ambulatory Visit: Payer: Self-pay | Admitting: Family Medicine

## 2022-09-01 ENCOUNTER — Other Ambulatory Visit: Payer: Self-pay

## 2022-09-01 MED ORDER — ZOLPIDEM TARTRATE ER 12.5 MG PO TBCR: EXTENDED_RELEASE_TABLET | ORAL | 2 refills | Status: DC

## 2022-09-02 ENCOUNTER — Other Ambulatory Visit: Payer: Self-pay | Admitting: Family Medicine

## 2022-09-02 DIAGNOSIS — E559 Vitamin D deficiency, unspecified: Secondary | ICD-10-CM

## 2022-09-22 ENCOUNTER — Ambulatory Visit (INDEPENDENT_AMBULATORY_CARE_PROVIDER_SITE_OTHER): Payer: Medicare HMO | Admitting: Family Medicine

## 2022-09-22 VITALS — BP 130/76 | HR 72 | Temp 97.0°F | Resp 16 | Ht 62.0 in | Wt 238.2 lb

## 2022-09-22 DIAGNOSIS — E890 Postprocedural hypothyroidism: Secondary | ICD-10-CM | POA: Diagnosis not present

## 2022-09-22 DIAGNOSIS — M79672 Pain in left foot: Secondary | ICD-10-CM

## 2022-09-22 DIAGNOSIS — E782 Mixed hyperlipidemia: Secondary | ICD-10-CM

## 2022-09-22 DIAGNOSIS — R7303 Prediabetes: Secondary | ICD-10-CM

## 2022-09-22 DIAGNOSIS — I5022 Chronic systolic (congestive) heart failure: Secondary | ICD-10-CM

## 2022-09-22 DIAGNOSIS — F331 Major depressive disorder, recurrent, moderate: Secondary | ICD-10-CM

## 2022-09-22 DIAGNOSIS — K219 Gastro-esophageal reflux disease without esophagitis: Secondary | ICD-10-CM

## 2022-09-22 DIAGNOSIS — Z6841 Body Mass Index (BMI) 40.0 and over, adult: Secondary | ICD-10-CM

## 2022-09-22 DIAGNOSIS — I10 Essential (primary) hypertension: Secondary | ICD-10-CM | POA: Diagnosis not present

## 2022-09-22 MED ORDER — SERTRALINE HCL 50 MG PO TABS: 50.0000 mg | ORAL_TABLET | Freq: Every day | ORAL | 0 refills | Status: DC

## 2022-09-22 MED ORDER — NALTREXONE-BUPROPION HCL ER 8-90 MG PO TB12: ORAL_TABLET | ORAL | 2 refills | Status: DC

## 2022-09-22 MED ORDER — LEVOTHYROXINE SODIUM 112 MCG PO TABS: 112.0000 ug | ORAL_TABLET | Freq: Every day | ORAL | 0 refills | Status: DC
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
## 2022-09-22 NOTE — Patient Instructions (Addendum)
Start on contrave one daily in am x 1 week, then twice daily x 1 week, then 2 twice daily.   Given edarbi 80 mg daily if bp starts to go back up before you get your regular bp meds from [facility].   Pepcid one oral twice daily until nexium comes in.   Sent prescription for synthroid 112 mcg once daily (14 days.)  Sent prescription for zoloft (14 day.)

## 2022-09-23 ENCOUNTER — Other Ambulatory Visit: Payer: Self-pay

## 2022-09-23 DIAGNOSIS — E559 Vitamin D deficiency, unspecified: Secondary | ICD-10-CM

## 2022-09-23 DIAGNOSIS — K219 Gastro-esophageal reflux disease without esophagitis: Secondary | ICD-10-CM

## 2022-09-23 DIAGNOSIS — I10 Essential (primary) hypertension: Secondary | ICD-10-CM

## 2022-09-23 LAB — CBC WITH DIFFERENTIAL/PLATELET
Basophils Absolute: 0 10*3/uL (ref 0.0–0.2)
Basos: 0 %
EOS (ABSOLUTE): 0.1 10*3/uL (ref 0.0–0.4)
Eos: 2 %
Hematocrit: 32.5 % — ABNORMAL LOW (ref 34.0–46.6)
Hemoglobin: 10.6 g/dL — ABNORMAL LOW (ref 11.1–15.9)
Immature Grans (Abs): 0 10*3/uL (ref 0.0–0.1)
Immature Granulocytes: 0 %
Lymphocytes Absolute: 1.3 10*3/uL (ref 0.7–3.1)
Lymphs: 27 %
MCH: 26.3 pg — ABNORMAL LOW (ref 26.6–33.0)
MCHC: 32.6 g/dL (ref 31.5–35.7)
MCV: 81 fL (ref 79–97)
Monocytes Absolute: 0.4 10*3/uL (ref 0.1–0.9)
Monocytes: 9 %
Neutrophils Absolute: 3 10*3/uL (ref 1.4–7.0)
Neutrophils: 62 %
Platelets: 245 10*3/uL (ref 150–450)
RBC: 4.03 x10E6/uL (ref 3.77–5.28)
RDW: 14.7 % (ref 11.7–15.4)
WBC: 4.8 10*3/uL (ref 3.4–10.8)

## 2022-09-23 LAB — HEMOGLOBIN A1C
Est. average glucose Bld gHb Est-mCnc: 117 mg/dL
Hgb A1c MFr Bld: 5.7 % — ABNORMAL HIGH (ref 4.8–5.6)

## 2022-09-23 LAB — LIPID PANEL
Chol/HDL Ratio: 2.6 ratio (ref 0.0–4.4)
Cholesterol, Total: 224 mg/dL — ABNORMAL HIGH (ref 100–199)
HDL: 86 mg/dL (ref >39)
LDL Chol Calc (NIH): 122 mg/dL — ABNORMAL HIGH (ref 0–99)
Triglycerides: 93 mg/dL (ref 0–149)
VLDL Cholesterol Cal: 16 mg/dL (ref 5–40)

## 2022-09-23 LAB — COMPREHENSIVE METABOLIC PANEL
ALT: 10 IU/L (ref 0–32)
AST: 11 IU/L (ref 0–40)
Albumin: 4.5 g/dL (ref 3.9–4.9)
Alkaline Phosphatase: 96 IU/L (ref 44–121)
BUN/Creatinine Ratio: 23 (ref 12–28)
BUN: 16 mg/dL (ref 8–27)
Bilirubin Total: 0.7 mg/dL (ref 0.0–1.2)
CO2: 24 mmol/L (ref 20–29)
Calcium: 9.9 mg/dL (ref 8.7–10.3)
Chloride: 100 mmol/L (ref 96–106)
Creatinine, Ser: 0.71 mg/dL (ref 0.57–1.00)
Globulin, Total: 2.9 g/dL (ref 1.5–4.5)
Glucose: 75 mg/dL (ref 70–99)
Potassium: 4 mmol/L (ref 3.5–5.2)
Sodium: 139 mmol/L (ref 134–144)
Total Protein: 7.4 g/dL (ref 6.0–8.5)
eGFR: 95 mL/min/{1.73_m2} (ref >59)

## 2022-09-23 LAB — TSH: TSH: 2.14 u[IU]/mL (ref 0.450–4.500)

## 2022-09-23 MED ORDER — NALTREXONE-BUPROPION HCL ER 8-90 MG PO TB12: ORAL_TABLET | ORAL | 2 refills | Status: DC

## 2022-09-23 MED ORDER — LEVOTHYROXINE SODIUM 112 MCG PO TABS: 112.0000 ug | ORAL_TABLET | Freq: Every day | ORAL | 2 refills | Status: DC

## 2022-09-23 MED ORDER — ATORVASTATIN CALCIUM 80 MG PO TABS: ORAL_TABLET | ORAL | 1 refills | Status: DC

## 2022-09-23 MED ORDER — TIZANIDINE HCL 4 MG PO TABS: 4.0000 mg | ORAL_TABLET | Freq: Three times a day (TID) | ORAL | 0 refills | Status: DC | PRN

## 2022-09-23 MED ORDER — VITAMIN D (ERGOCALCIFEROL) 1.25 MG (50000 UNIT) PO CAPS
ORAL_CAPSULE | ORAL | 2 refills | Status: DC
Start: 2022-09-23 — End: 2022-12-22

## 2022-09-23 MED ORDER — VALSARTAN-HYDROCHLOROTHIAZIDE 160-12.5 MG PO TABS: 1.0000 | ORAL_TABLET | Freq: Every day | ORAL | 1 refills | Status: DC

## 2022-09-23 MED ORDER — AMLODIPINE BESYLATE 10 MG PO TABS: 10.0000 mg | ORAL_TABLET | Freq: Every day | ORAL | 1 refills | Status: DC

## 2022-09-23 MED ORDER — ZOLPIDEM TARTRATE ER 12.5 MG PO TBCR: EXTENDED_RELEASE_TABLET | ORAL | 1 refills | Status: DC

## 2022-09-23 MED ORDER — NEXIUM 40 MG PO CPDR: 40.0000 mg | DELAYED_RELEASE_CAPSULE | Freq: Every day | ORAL | 1 refills | Status: DC

## 2022-09-23 MED ORDER — SYSTANE 0.4-0.3 % OP SOLN: 1.0000 [drp] | Freq: Four times a day (QID) | OPHTHALMIC | 3 refills | Status: AC | PRN

## 2022-09-23 MED ORDER — SERTRALINE HCL 50 MG PO TABS: 50.0000 mg | ORAL_TABLET | Freq: Every day | ORAL | 3 refills | Status: DC

## 2022-09-27 DIAGNOSIS — M79672 Pain in left foot: Secondary | ICD-10-CM

## 2022-09-27 DIAGNOSIS — M79605 Pain in left leg: Secondary | ICD-10-CM | POA: Insufficient documentation

## 2022-09-27 HISTORY — DX: Pain in left foot: M79.672

## 2022-09-27 NOTE — Assessment & Plan Note (Signed)
Recommend continue to work on eating healthy diet and exercise.

## 2022-09-27 NOTE — Assessment & Plan Note (Signed)
Start on contrave one daily in am x 1 week, then twice daily x 1 week, then 2 twice daily.

## 2022-09-27 NOTE — Assessment & Plan Note (Signed)
Pepcid one oral twice daily until nexium comes in.

## 2022-09-27 NOTE — Assessment & Plan Note (Signed)
Referring to podiatry

## 2022-09-27 NOTE — Assessment & Plan Note (Signed)
Well controlled.  No changes to medicines.  Lipitor 80 mg daily Continue to work on eating a healthy diet and exercise.  Labs drawn today.

## 2022-09-27 NOTE — Assessment & Plan Note (Signed)
Sent prescription for zoloft (14 day.)

## 2022-09-27 NOTE — Assessment & Plan Note (Signed)
Sent prescription for synthroid 112 mcg once daily (14 days.)

## 2022-09-27 NOTE — Assessment & Plan Note (Signed)
Stable

## 2022-09-27 NOTE — Assessment & Plan Note (Signed)
Given edarbi 80 mg daily if bp starts to go back up before you get your regular bp meds from [facility].

## 2022-09-28 ENCOUNTER — Encounter: Payer: Self-pay | Admitting: Family Medicine

## 2022-09-30 ENCOUNTER — Telehealth: Payer: Self-pay

## 2022-09-30 MED ORDER — ESOMEPRAZOLE MAGNESIUM 40 MG PO CPDR: 40.0000 mg | DELAYED_RELEASE_CAPSULE | Freq: Every day | ORAL | 0 refills | Status: DC

## 2022-09-30 NOTE — Telephone Encounter (Signed)
Patient called and stated she was using [facility], since they have close she has went back to using [facility], but [facility] is telling her she owes them 2000 dollars in order to get her medication.  Called [facility] and talk to Aracely she stated that the contrave was 1900 dollars and Nexium was changed to generic. And she stated that patient needs to call [phone], so they can send her medication to her.  Patient Made Aware, Verbalized Understanding.

## 2022-10-01 ENCOUNTER — Other Ambulatory Visit: Payer: Self-pay

## 2022-10-07 ENCOUNTER — Encounter: Payer: Self-pay | Admitting: Family Medicine

## 2022-10-07 ENCOUNTER — Ambulatory Visit (INDEPENDENT_AMBULATORY_CARE_PROVIDER_SITE_OTHER): Payer: Medicare HMO | Admitting: Family Medicine

## 2022-10-07 VITALS — BP 130/64 | HR 84 | Temp 96.8°F | Resp 18 | Ht 64.0 in | Wt 239.0 lb

## 2022-10-07 DIAGNOSIS — M722 Plantar fascial fibromatosis: Secondary | ICD-10-CM | POA: Insufficient documentation

## 2022-10-07 HISTORY — DX: Plantar fascial fibromatosis: M72.2

## 2022-10-07 MED ORDER — LEVOTHYROXINE SODIUM 112 MCG PO TABS: 112.0000 ug | ORAL_TABLET | Freq: Every day | ORAL | 0 refills | Status: DC
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
## 2022-10-07 NOTE — Assessment & Plan Note (Addendum)
Education given. Recommend heel lifts.  Plantar fascial injection completed.

## 2022-11-10 ENCOUNTER — Other Ambulatory Visit: Payer: Self-pay | Admitting: Acute Care

## 2022-11-10 DIAGNOSIS — N6320 Unspecified lump in the left breast, unspecified quadrant: Secondary | ICD-10-CM

## 2022-12-10 ENCOUNTER — Ambulatory Visit
Admission: RE | Admit: 2022-12-10 | Discharge: 2022-12-10 | Disposition: A | Discharge status: Home / Self Care | Payer: Medicare HMO | Source: Ambulatory Visit | Attending: Acute Care | Admitting: Acute Care

## 2022-12-10 ENCOUNTER — Ambulatory Visit: Admission: RE | Admit: 2022-12-10 | Payer: Medicare HMO | Source: Ambulatory Visit

## 2022-12-10 DIAGNOSIS — N6320 Unspecified lump in the left breast, unspecified quadrant: Secondary | ICD-10-CM

## 2022-12-10 DIAGNOSIS — N6321 Unspecified lump in the left breast, upper outer quadrant: Secondary | ICD-10-CM | POA: Diagnosis not present

## 2022-12-10 DIAGNOSIS — R92333 Mammographic heterogeneous density, bilateral breasts: Secondary | ICD-10-CM | POA: Diagnosis not present

## 2022-12-22 ENCOUNTER — Other Ambulatory Visit: Payer: Self-pay | Admitting: Family Medicine

## 2022-12-22 DIAGNOSIS — E559 Vitamin D deficiency, unspecified: Secondary | ICD-10-CM

## 2022-12-25 ENCOUNTER — Telehealth: Payer: Self-pay

## 2022-12-25 ENCOUNTER — Other Ambulatory Visit: Payer: Self-pay | Admitting: Family Medicine

## 2022-12-25 ENCOUNTER — Encounter: Payer: Self-pay | Admitting: Family Medicine

## 2022-12-25 ENCOUNTER — Ambulatory Visit: Payer: Medicare HMO | Admitting: Family Medicine

## 2022-12-25 VITALS — BP 130/76 | HR 69 | Temp 97.6°F | Ht 62.0 in | Wt 253.0 lb

## 2022-12-25 DIAGNOSIS — E890 Postprocedural hypothyroidism: Secondary | ICD-10-CM

## 2022-12-25 DIAGNOSIS — E782 Mixed hyperlipidemia: Secondary | ICD-10-CM

## 2022-12-25 DIAGNOSIS — I10 Essential (primary) hypertension: Secondary | ICD-10-CM | POA: Diagnosis not present

## 2022-12-25 DIAGNOSIS — R635 Abnormal weight gain: Secondary | ICD-10-CM | POA: Diagnosis not present

## 2022-12-25 DIAGNOSIS — E66813 Obesity, class 3: Secondary | ICD-10-CM

## 2022-12-25 DIAGNOSIS — Z13820 Encounter for screening for osteoporosis: Secondary | ICD-10-CM

## 2022-12-25 DIAGNOSIS — R7301 Impaired fasting glucose: Secondary | ICD-10-CM

## 2022-12-25 DIAGNOSIS — E559 Vitamin D deficiency, unspecified: Secondary | ICD-10-CM

## 2022-12-25 DIAGNOSIS — F5101 Primary insomnia: Secondary | ICD-10-CM | POA: Diagnosis not present

## 2022-12-25 DIAGNOSIS — R7303 Prediabetes: Secondary | ICD-10-CM

## 2022-12-25 DIAGNOSIS — Z23 Encounter for immunization: Secondary | ICD-10-CM | POA: Diagnosis not present

## 2022-12-25 DIAGNOSIS — Z6841 Body Mass Index (BMI) 40.0 and over, adult: Secondary | ICD-10-CM

## 2022-12-25 DIAGNOSIS — Z780 Asymptomatic menopausal state: Secondary | ICD-10-CM

## 2022-12-25 MED ORDER — VALSARTAN-HYDROCHLOROTHIAZIDE 160-12.5 MG PO TABS: 1.0000 | ORAL_TABLET | Freq: Every day | ORAL | 1 refills | Status: DC

## 2022-12-25 MED ORDER — ATORVASTATIN CALCIUM 80 MG PO TABS: ORAL_TABLET | ORAL | 1 refills | Status: DC

## 2022-12-25 MED ORDER — ZOLPIDEM TARTRATE ER 12.5 MG PO TBCR: EXTENDED_RELEASE_TABLET | ORAL | 1 refills | Status: DC

## 2022-12-25 NOTE — Telephone Encounter (Signed)
Copied from CRM [id number]. Topic: Clinical - Medical Advice >> Dec 25, 2022  8:11 AM Donna Mayer H wrote: Reason for CRM: Pt would like to know if Dr.Cox can go ahead and write her lab orders before today's appt so she doesn't have to fast for so long.

## 2022-12-25 NOTE — Telephone Encounter (Signed)
Patient made aware, she can come get labs done, labs are in
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
## 2022-12-26 LAB — CBC WITH DIFFERENTIAL/PLATELET
Basophils Absolute: 0 10*3/uL (ref 0.0–0.2)
Basos: 1 %
EOS (ABSOLUTE): 0.1 10*3/uL (ref 0.0–0.4)
Eos: 2 %
Hematocrit: 34 % (ref 34.0–46.6)
Hemoglobin: 10.7 g/dL — ABNORMAL LOW (ref 11.1–15.9)
Immature Grans (Abs): 0 10*3/uL (ref 0.0–0.1)
Immature Granulocytes: 0 %
Lymphocytes Absolute: 1 10*3/uL (ref 0.7–3.1)
Lymphs: 20 %
MCH: 25.7 pg — ABNORMAL LOW (ref 26.6–33.0)
MCHC: 31.5 g/dL (ref 31.5–35.7)
MCV: 82 fL (ref 79–97)
Monocytes Absolute: 0.5 10*3/uL (ref 0.1–0.9)
Monocytes: 9 %
Neutrophils Absolute: 3.5 10*3/uL (ref 1.4–7.0)
Neutrophils: 68 %
Platelets: 254 10*3/uL (ref 150–450)
RBC: 4.16 x10E6/uL (ref 3.77–5.28)
RDW: 14.2 % (ref 11.7–15.4)
WBC: 5.1 10*3/uL (ref 3.4–10.8)

## 2022-12-26 LAB — HEMOGLOBIN A1C
Est. average glucose Bld gHb Est-mCnc: 123 mg/dL
Hgb A1c MFr Bld: 5.9 % — ABNORMAL HIGH (ref 4.8–5.6)

## 2022-12-26 LAB — CMP14+EGFR
ALT: 10 [IU]/L (ref 0–32)
AST: 18 [IU]/L (ref 0–40)
Albumin: 4.2 g/dL (ref 3.9–4.9)
Alkaline Phosphatase: 104 [IU]/L (ref 44–121)
BUN/Creatinine Ratio: 17 (ref 12–28)
BUN: 13 mg/dL (ref 8–27)
Bilirubin Total: 0.6 mg/dL (ref 0.0–1.2)
CO2: 24 mmol/L (ref 20–29)
Calcium: 9.3 mg/dL (ref 8.7–10.3)
Chloride: 101 mmol/L (ref 96–106)
Creatinine, Ser: 0.76 mg/dL (ref 0.57–1.00)
Globulin, Total: 3.3 g/dL (ref 1.5–4.5)
Glucose: 77 mg/dL (ref 70–99)
Potassium: 4.2 mmol/L (ref 3.5–5.2)
Sodium: 139 mmol/L (ref 134–144)
Total Protein: 7.5 g/dL (ref 6.0–8.5)
eGFR: 87 mL/min/{1.73_m2} (ref >59)

## 2022-12-26 LAB — TSH: TSH: 4.64 u[IU]/mL — ABNORMAL HIGH (ref 0.450–4.500)

## 2022-12-26 LAB — LIPID PANEL
Chol/HDL Ratio: 2 {ratio} (ref 0.0–4.4)
Cholesterol, Total: 209 mg/dL — ABNORMAL HIGH (ref 100–199)
HDL: 104 mg/dL (ref >39)
LDL Chol Calc (NIH): 94 mg/dL (ref 0–99)
Triglycerides: 64 mg/dL (ref 0–149)
VLDL Cholesterol Cal: 11 mg/dL (ref 5–40)

## 2022-12-28 DIAGNOSIS — Z780 Asymptomatic menopausal state: Secondary | ICD-10-CM | POA: Insufficient documentation

## 2022-12-28 DIAGNOSIS — R7301 Impaired fasting glucose: Secondary | ICD-10-CM | POA: Insufficient documentation

## 2022-12-28 DIAGNOSIS — Z23 Encounter for immunization: Secondary | ICD-10-CM | POA: Insufficient documentation

## 2022-12-28 DIAGNOSIS — Z13820 Encounter for screening for osteoporosis: Secondary | ICD-10-CM

## 2022-12-28 DIAGNOSIS — R635 Abnormal weight gain: Secondary | ICD-10-CM

## 2022-12-28 HISTORY — DX: Impaired fasting glucose: R73.01

## 2022-12-28 HISTORY — DX: Encounter for immunization: Z23

## 2022-12-28 HISTORY — DX: Encounter for screening for osteoporosis: Z13.820

## 2022-12-28 HISTORY — DX: Abnormal weight gain: R63.5

## 2022-12-28 NOTE — Assessment & Plan Note (Signed)
-  Discontinue Naltrexone/Bupropion combination for weight loss due to lack of efficacy. -Consider alternative weight loss strategies, including potential use of Ozempic if affordable and approved by insurance. STOP ZOLOFT.

## 2022-12-28 NOTE — Assessment & Plan Note (Signed)
Significant weight gain since starting Sertraline in June. Patient reports using mushroom coffee (Rise) for mood stabilization and digestive health. -Discontinue Sertraline due to weight gain. -Consider alternative mood stabilizers if needed.

## 2022-12-28 NOTE — Assessment & Plan Note (Signed)
Well controlled.  No changes to medicines.  Lipitor 80 mg daily Continue to work on eating a healthy diet and exercise.  Labs drawn today.

## 2022-12-28 NOTE — Assessment & Plan Note (Signed)
Continue ambien cr

## 2022-12-28 NOTE — Assessment & Plan Note (Addendum)
Well controlled.  No changes to medicines. Diovan-HCT 320/12.5 mg daily Continue to work on eating a healthy diet and exercise.  Labs drawn today.

## 2022-12-28 NOTE — Assessment & Plan Note (Signed)
Recommend continue to work on eating healthy diet and exercise.  

## 2022-12-28 NOTE — Assessment & Plan Note (Signed)
Continue synthroid 112 mcg once daily °

## 2022-12-28 NOTE — Assessment & Plan Note (Signed)
CHECK DEXA.

## 2022-12-30 ENCOUNTER — Encounter: Payer: Medicare HMO | Admitting: Family Medicine

## 2022-12-30 ENCOUNTER — Other Ambulatory Visit: Payer: Self-pay

## 2022-12-30 DIAGNOSIS — E890 Postprocedural hypothyroidism: Secondary | ICD-10-CM

## 2022-12-30 MED ORDER — LEVOTHYROXINE SODIUM 125 MCG PO TABS: 125.0000 ug | ORAL_TABLET | Freq: Every day | ORAL | 0 refills | Status: DC
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
## 2022-12-31 ENCOUNTER — Encounter: Payer: Self-pay | Admitting: Family Medicine

## 2022-12-31 NOTE — Progress Notes (Signed)
This encounter was created in error - please disregard.

## 2023-02-05 ENCOUNTER — Ambulatory Visit: Payer: Medicare Other | Admitting: Family Medicine

## 2023-02-05 ENCOUNTER — Encounter: Payer: Self-pay | Admitting: Family Medicine

## 2023-02-05 VITALS — BP 158/86 | HR 90 | Temp 97.8°F | Ht 62.0 in | Wt 257.0 lb

## 2023-02-05 DIAGNOSIS — Z0000 Encounter for general adult medical examination without abnormal findings: Secondary | ICD-10-CM | POA: Diagnosis not present
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
## 2023-02-10 ENCOUNTER — Ambulatory Visit: Payer: Medicare Other

## 2023-02-10 DIAGNOSIS — E890 Postprocedural hypothyroidism: Secondary | ICD-10-CM

## 2023-02-11 ENCOUNTER — Encounter: Payer: Self-pay | Admitting: Family Medicine

## 2023-02-11 ENCOUNTER — Other Ambulatory Visit: Payer: Self-pay | Admitting: Family Medicine

## 2023-02-11 DIAGNOSIS — E890 Postprocedural hypothyroidism: Secondary | ICD-10-CM

## 2023-02-11 LAB — TSH: TSH: 1.38 u[IU]/mL (ref 0.450–4.500)

## 2023-02-11 LAB — T4, FREE: Free T4: 2 ng/dL — ABNORMAL HIGH (ref 0.82–1.77)

## 2023-02-11 MED ORDER — LEVOTHYROXINE SODIUM 125 MCG PO TABS: 125.0000 ug | ORAL_TABLET | Freq: Every day | ORAL | 3 refills | Status: DC

## 2023-02-12 ENCOUNTER — Ambulatory Visit: Payer: Medicare Other

## 2023-02-12 ENCOUNTER — Ambulatory Visit (INDEPENDENT_AMBULATORY_CARE_PROVIDER_SITE_OTHER): Payer: Medicare Other | Admitting: Family Medicine

## 2023-02-12 ENCOUNTER — Encounter: Payer: Self-pay | Admitting: Physician Assistant

## 2023-02-12 VITALS — BP 150/90 | HR 96 | Temp 97.8°F | Resp 16 | Ht 62.0 in | Wt 253.8 lb

## 2023-02-12 DIAGNOSIS — J101 Influenza due to other identified influenza virus with other respiratory manifestations: Secondary | ICD-10-CM

## 2023-02-12 DIAGNOSIS — E890 Postprocedural hypothyroidism: Secondary | ICD-10-CM

## 2023-02-12 DIAGNOSIS — I10 Essential (primary) hypertension: Secondary | ICD-10-CM | POA: Diagnosis not present

## 2023-02-12 HISTORY — DX: Influenza due to other identified influenza virus with other respiratory manifestations: J10.1

## 2023-02-12 MED ORDER — FLUTICASONE PROPIONATE 50 MCG/ACT NA SUSP: 2.0000 | Freq: Every day | NASAL | 6 refills | Status: DC

## 2023-02-12 MED ORDER — BENZONATATE 200 MG PO CAPS: 200.0000 mg | ORAL_CAPSULE | Freq: Two times a day (BID) | ORAL | 0 refills | Status: DC | PRN
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
## 2023-02-12 NOTE — Patient Instructions (Addendum)
Influenza Started on benzonatate (Tessalon Perles) 1 twice a day as needed for cough.  May also use Mucinex. Start Flonase 2 sprays each nostril once daily for nasal congestion. Recommend rest and fluids.

## 2023-02-15 ENCOUNTER — Encounter: Payer: Self-pay | Admitting: Family Medicine

## 2023-02-15 LAB — POCT INFLUENZA A/B
Influenza A, POC: POSITIVE — AB
Influenza B, POC: NEGATIVE

## 2023-02-15 LAB — POC COVID19 BINAXNOW: SARS Coronavirus 2 Ag: NEGATIVE

## 2023-02-15 NOTE — Assessment & Plan Note (Signed)
Recent thyroid function tests showed slightly high thyroid hormone levels on current medication. -Continue current thyroid medication regimen.

## 2023-02-15 NOTE — Assessment & Plan Note (Signed)
Positive rapid flu test. Symptoms include headache, eye ache, cough, nasal congestion, and general malaise for 4 days. -Continue supportive care with rest, fluids, and over-the-counter medications. -Prescribe Tessalon Perles for cough. -Prescribe Flonase nasal spray for nasal congestion.

## 2023-02-15 NOTE — Assessment & Plan Note (Signed)
Elevated blood pressure reading of 150/90 during the visit. No recent changes in medication. -Continue current antihypertensive regimen.

## 2023-03-16 ENCOUNTER — Other Ambulatory Visit: Payer: Self-pay | Admitting: Family Medicine

## 2023-03-16 ENCOUNTER — Other Ambulatory Visit: Payer: Self-pay

## 2023-03-16 DIAGNOSIS — F5101 Primary insomnia: Secondary | ICD-10-CM

## 2023-03-16 DIAGNOSIS — E890 Postprocedural hypothyroidism: Secondary | ICD-10-CM

## 2023-03-16 DIAGNOSIS — E782 Mixed hyperlipidemia: Secondary | ICD-10-CM

## 2023-03-16 DIAGNOSIS — I10 Essential (primary) hypertension: Secondary | ICD-10-CM

## 2023-03-16 MED ORDER — ZOLPIDEM TARTRATE ER 12.5 MG PO TBCR: EXTENDED_RELEASE_TABLET | ORAL | 1 refills | Status: DC

## 2023-03-16 MED ORDER — LEVOTHYROXINE SODIUM 125 MCG PO TABS: 125.0000 ug | ORAL_TABLET | Freq: Every day | ORAL | 3 refills | Status: DC

## 2023-03-16 MED ORDER — VALSARTAN-HYDROCHLOROTHIAZIDE 160-12.5 MG PO TABS: 1.0000 | ORAL_TABLET | Freq: Every day | ORAL | 1 refills | Status: DC

## 2023-03-16 MED ORDER — ATORVASTATIN CALCIUM 80 MG PO TABS: ORAL_TABLET | ORAL | 1 refills | Status: DC

## 2023-03-16 MED ORDER — ESOMEPRAZOLE MAGNESIUM 40 MG PO CPDR: 40.0000 mg | DELAYED_RELEASE_CAPSULE | Freq: Every day | ORAL | 0 refills | Status: DC

## 2023-03-16 NOTE — Telephone Encounter (Signed)
 Copied from CRM [id number]. Topic: Clinical - Medication Refill >> Mar 16, 2023  9:12 AM Gery Pray wrote: Most Recent Primary Care Visit:  Provider: [facility]  Department[facility]  Visit Type: ACUTE  Date: 02/12/2023  Medication: zolpidem (AMBIEN CR) 12.5 MG CR tablet, valsartan-hydrochlorothiazide (DIOVAN-HCT) 160-12.5 MG tablet  Has the patient contacted their pharmacy? Yes (Agent: If no, request that the patient contact the pharmacy for the refill. If patient does not wish to contact the pharmacy document the reason why and proceed with request.) (Agent: If yes, when and what did the pharmacy advise?)  Is this the correct pharmacy for this prescription? Yes If no, delete pharmacy and type the correct one.  This is the patient's preferred pharmacy:  [facility] [phone] - [place], [place] - [phone] AT [facility] [address] [place] [address] Phone: [phone] Fax: [phone]  [facility] - [place], [place] - [phone] [address] [place] [address] Phone: [phone] Fax: [phone]   Has the prescription been filled recently? No  Is the patient out of the medication? No  Has the patient been seen for an appointment in the last year OR does the patient have an upcoming appointment? Yes  Can we respond through MyChart? Yes  Agent: Please be advised that Rx refills may take up to 3 business days. We ask that you follow-up with your pharmacy.

## 2023-03-16 NOTE — Telephone Encounter (Signed)
 Copied from CRM [id number]. Topic: Clinical - Medication Refill >> Mar 16, 2023  9:26 AM Gery Pray wrote: Most Recent Primary Care Visit:  Provider: [facility]  [facility]  Visit Type: ACUTE  Date: 02/12/2023  Medication: levothyroxine (SYNTHROID) 125 MCG tablet  Has the patient contacted their pharmacy? Yes (Agent: If no, request that the patient contact the pharmacy for the refill. If patient does not wish to contact the pharmacy document the reason why and proceed with request.) (Agent: If yes, when and what did the pharmacy advise?)  Is this the correct pharmacy for this prescription? Yes If no, delete pharmacy and type the correct one.  This is the patient's preferred pharmacy:  [facility] [phone] - [place], [place] - [phone] [address] AT [facility] [address] [place] [address] Phone: [phone] Fax: [phone]  [facility] - [place], [place] - [phone] [address] [place] [address] Phone: [phone] Fax: [phone]   Has the prescription been filled recently? No  Is the patient out of the medication? Yes  Has the patient been seen for an appointment in the last year OR does the patient have an upcoming appointment? Yes  Can we respond through MyChart? Yes  Agent: Please be advised that Rx refills may take up to 3 business days. We ask that you follow-up with your pharmacy.

## 2023-04-28 DIAGNOSIS — M6701 Short Achilles tendon (acquired), right ankle: Secondary | ICD-10-CM | POA: Diagnosis not present

## 2023-04-28 DIAGNOSIS — M722 Plantar fascial fibromatosis: Secondary | ICD-10-CM | POA: Diagnosis not present

## 2023-05-22 IMAGING — MG MM DIGITAL DIAGNOSTIC UNILAT*L* W/ TOMO W/ CAD
4 series · 4 of 12 positions shown · non-contrast
Comparison: Previous exam(s).

CLINICAL DATA: Recall from screening mammography, possible mass
involving the UPPER OUTER QUADRANT of the LEFT breast at posterior
depth.

EXAM:
DIGITAL DIAGNOSTIC UNILATERAL LEFT MAMMOGRAM WITH TOMOSYNTHESIS AND
CAD; ULTRASOUND LEFT BREAST LIMITED
TECHNIQUE: Left digital diagnostic mammography and breast tomosynthesis was
performed. The images were evaluated with computer-aided detection.;
Targeted ultrasound examination of the left breast was performed.

[L CC synth-2D]
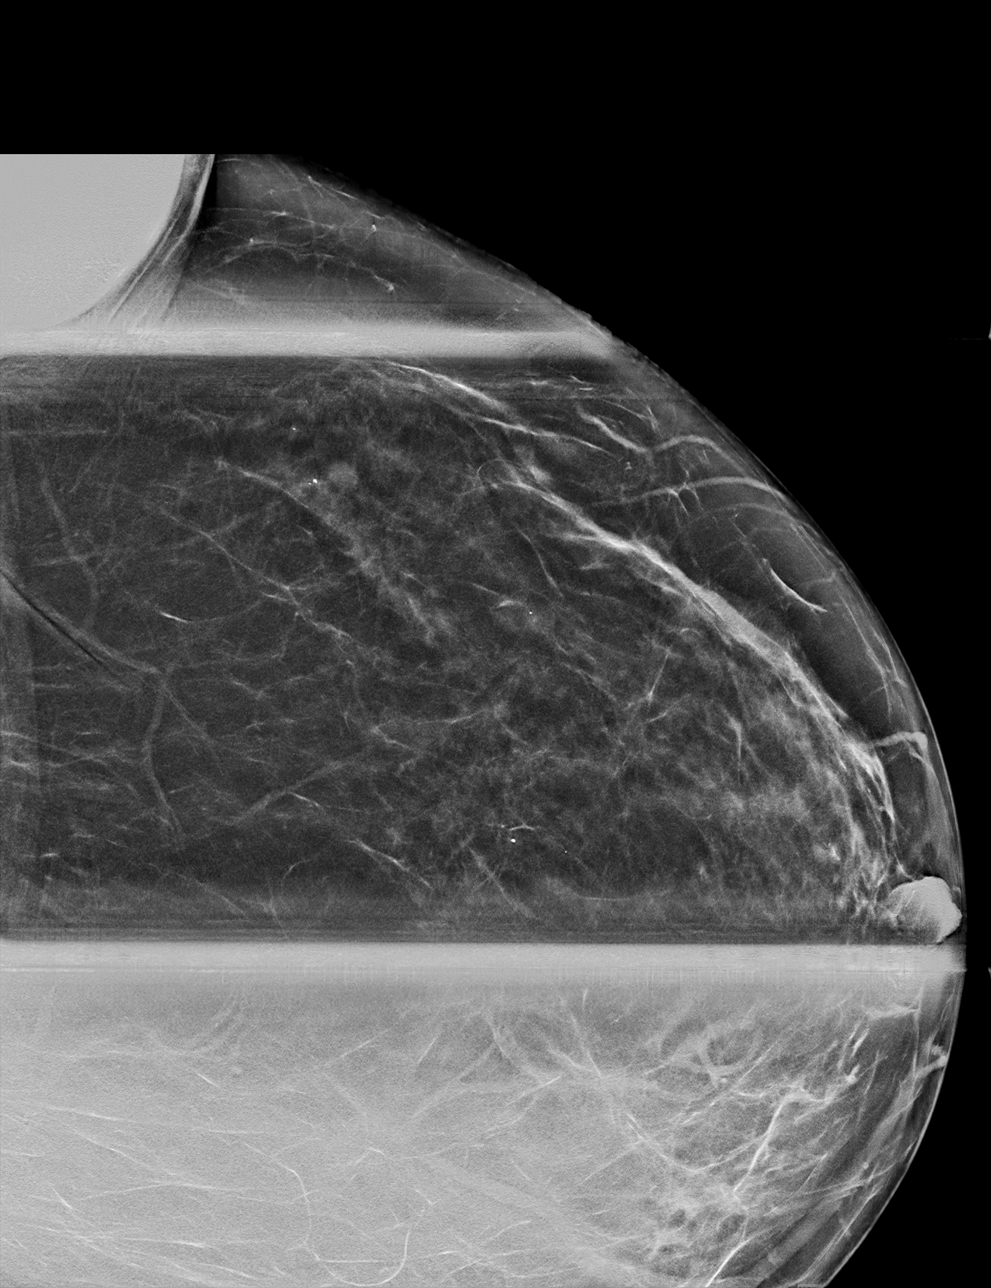

[L MLO synth-2D]
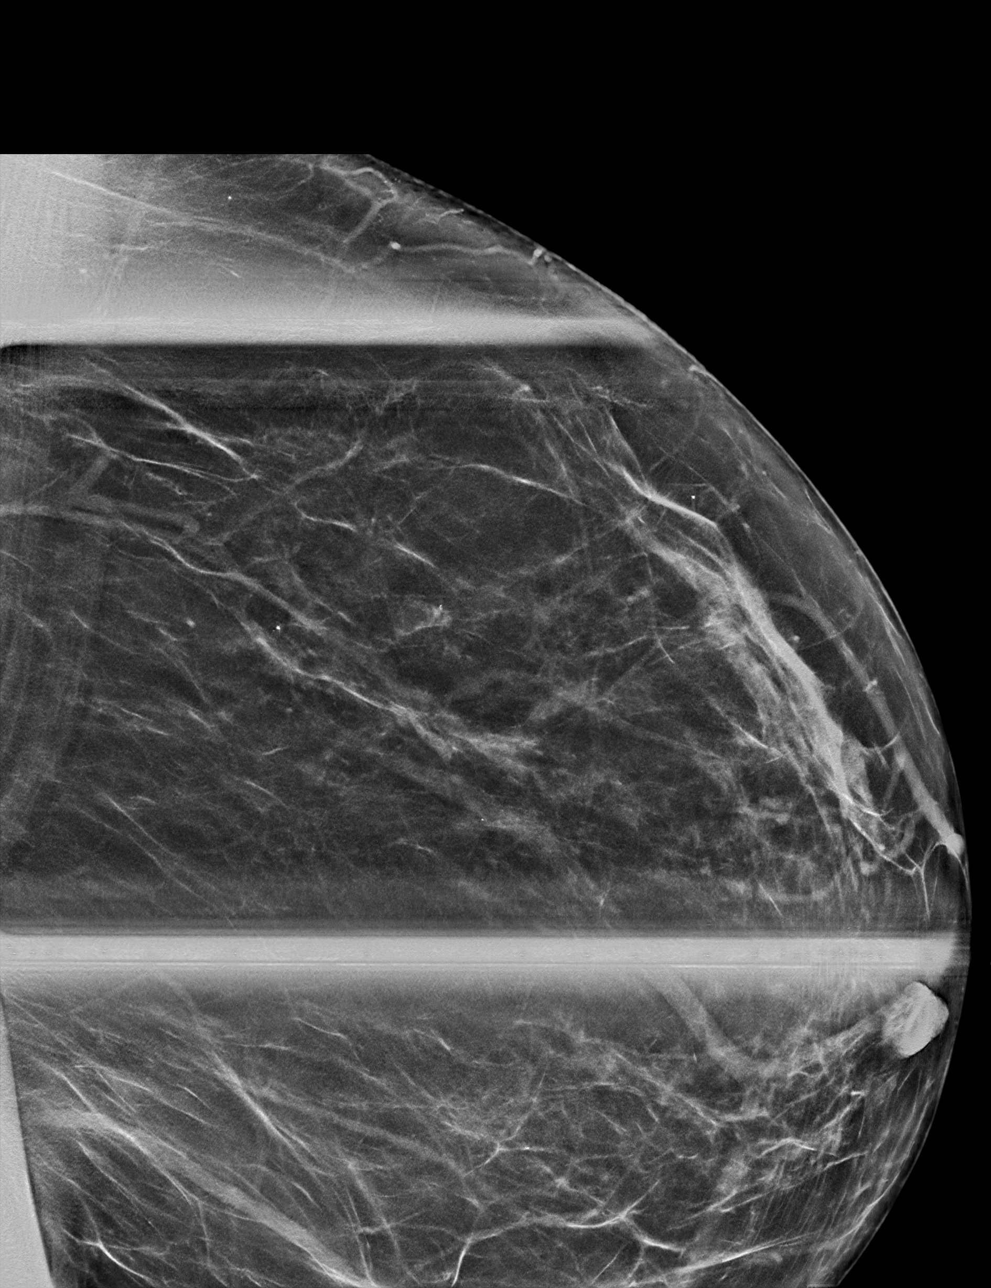

[L CC tomo · tomo slice 33/66.0]
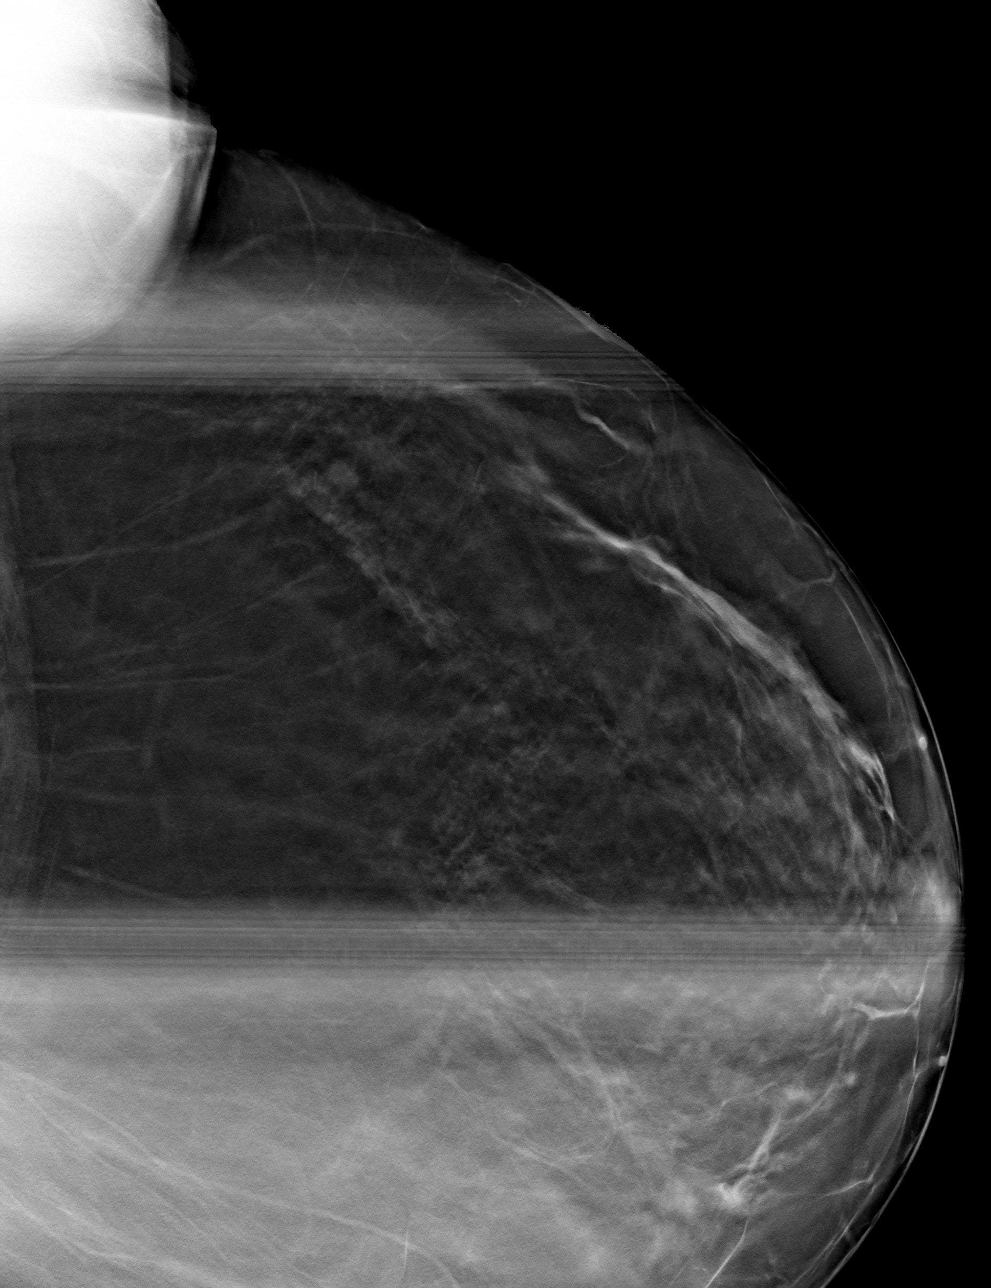

[L MLO tomo · tomo slice 36/71.0]
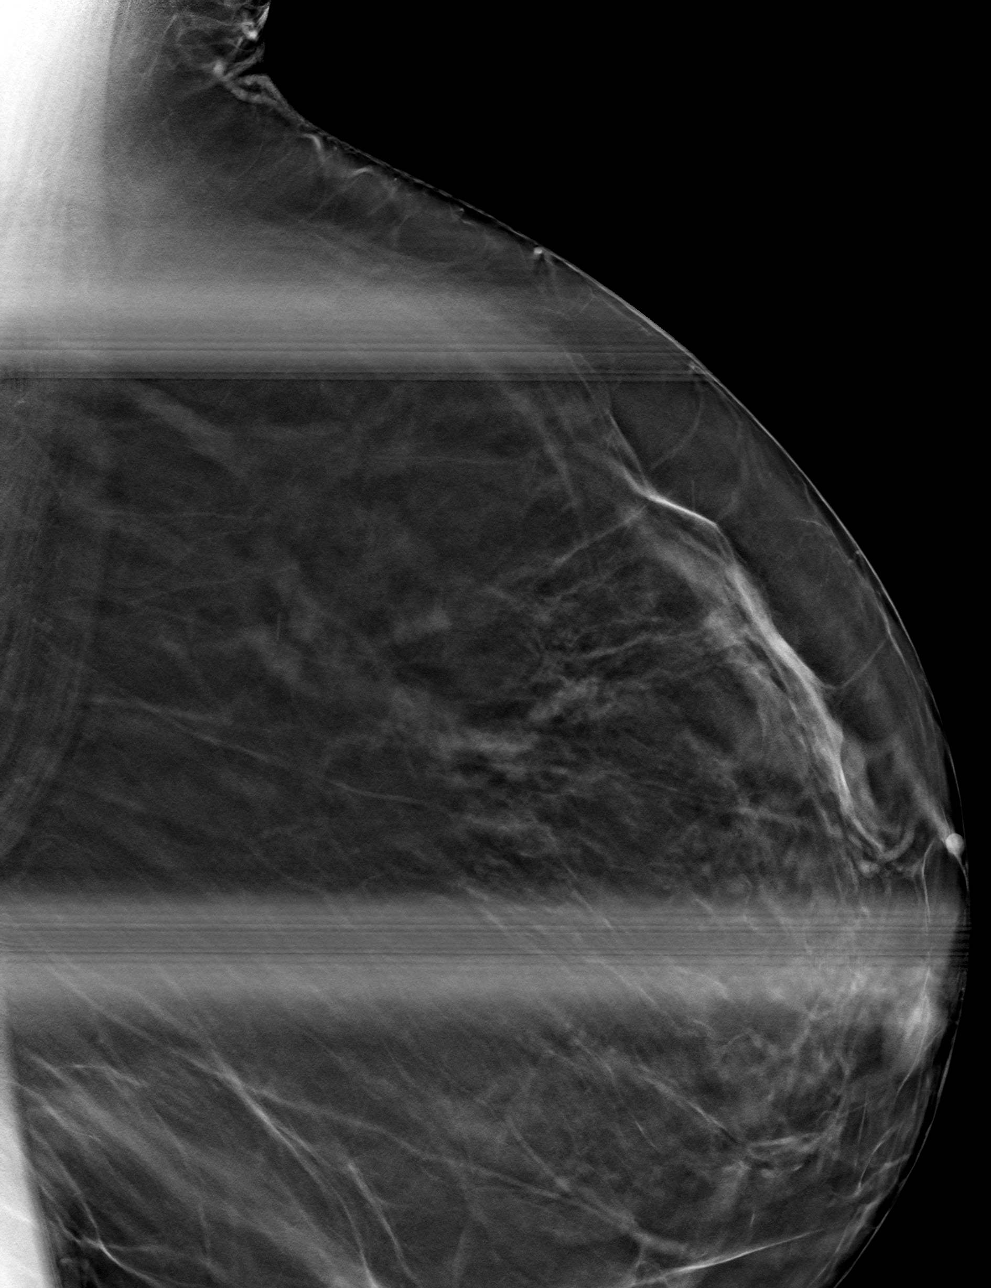

[4 of 12 positions shown; findings below may reference images not displayed]

ACR Breast Density Category b: There are scattered areas of
fibroglandular density.
FINDINGS: Spot-compression CC and MLO views of the area of concern were
obtained.

There is a circumscribed isodense mass measuring approximately 6-7
mm in the UPPER OUTER QUADRANT at posterior depth adjacent to a
benign calcification. There is no associated architectural
distortion or suspicious calcifications.

Targeted ultrasound is performed, demonstrating normal scattered
fibroglandular tissue throughout the UPPER OUTER QUADRANT with
imaging from 12 o'clock through 4 o'clock. No cyst, solid mass or
abnormal acoustic shadowing is identified.
IMPRESSION: Likely benign mass or focal asymmetry in the UPPER OUTER QUADRANT of
the LEFT breast without sonographic correlate.

RECOMMENDATION:
Diagnostic LEFT mammogram in 6 months.

I have discussed the findings and recommendations with the patient.
If applicable, a reminder letter will be sent to the patient
regarding the next appointment.

BI-RADS CATEGORY  3: Probably benign.

## 2023-05-22 IMAGING — US US BREAST*L* LIMITED INC AXILLA
1 series · 5 of 5 positions shown · non-contrast
Comparison: Previous exam(s).

CLINICAL DATA: Recall from screening mammography, possible mass
involving the UPPER OUTER QUADRANT of the LEFT breast at posterior
depth.

EXAM:
DIGITAL DIAGNOSTIC UNILATERAL LEFT MAMMOGRAM WITH TOMOSYNTHESIS AND
CAD; ULTRASOUND LEFT BREAST LIMITED
TECHNIQUE: Left digital diagnostic mammography and breast tomosynthesis was
performed. The images were evaluated with computer-aided detection.;
Targeted ultrasound examination of the left breast was performed.

[Series 1: us breast*left* limited inc axilla · 0.07mm/px · 5 of 5 slices shown]
[im 1/5]
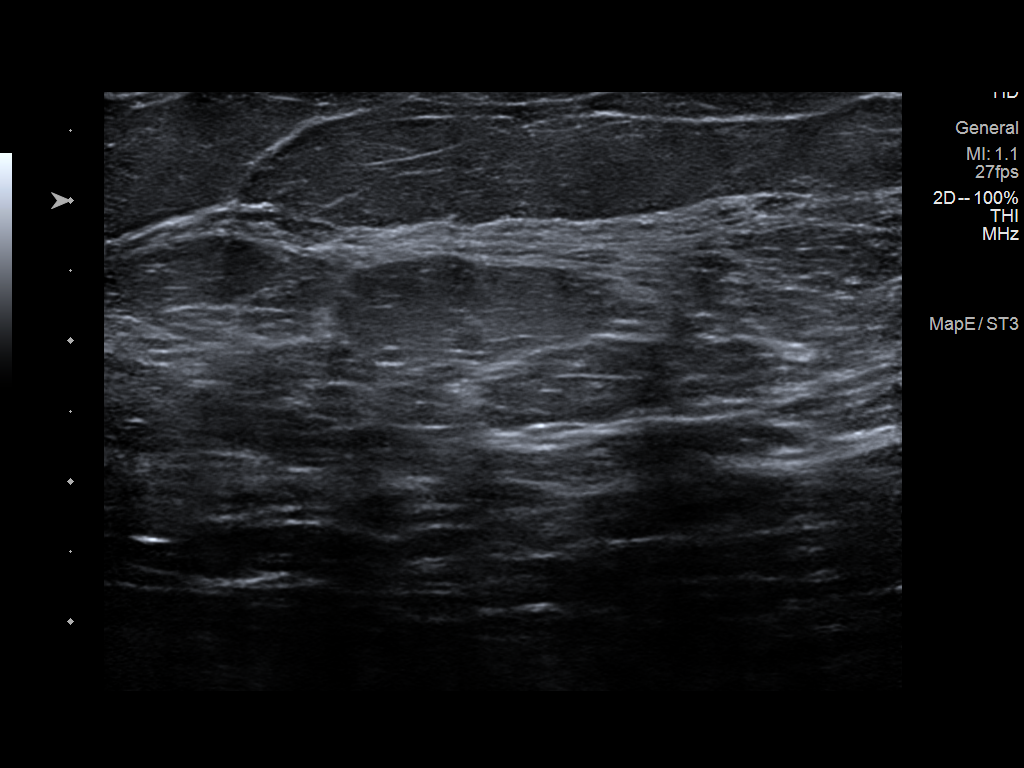
[im 2/5]
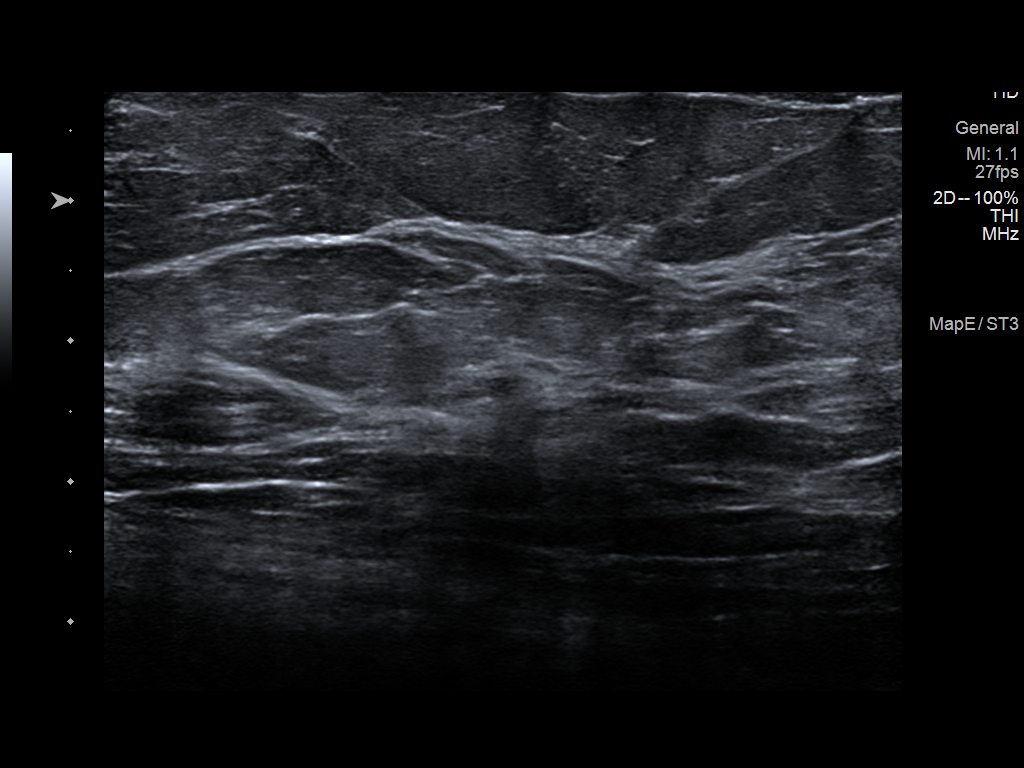
[im 3/5]
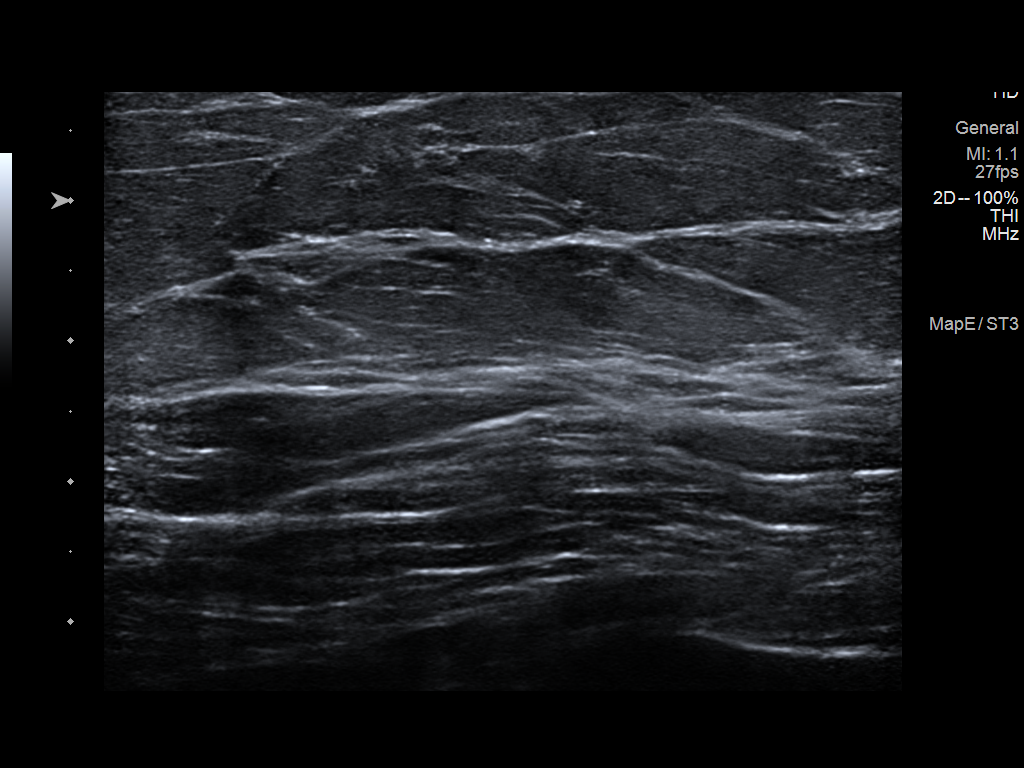
[im 4/5]
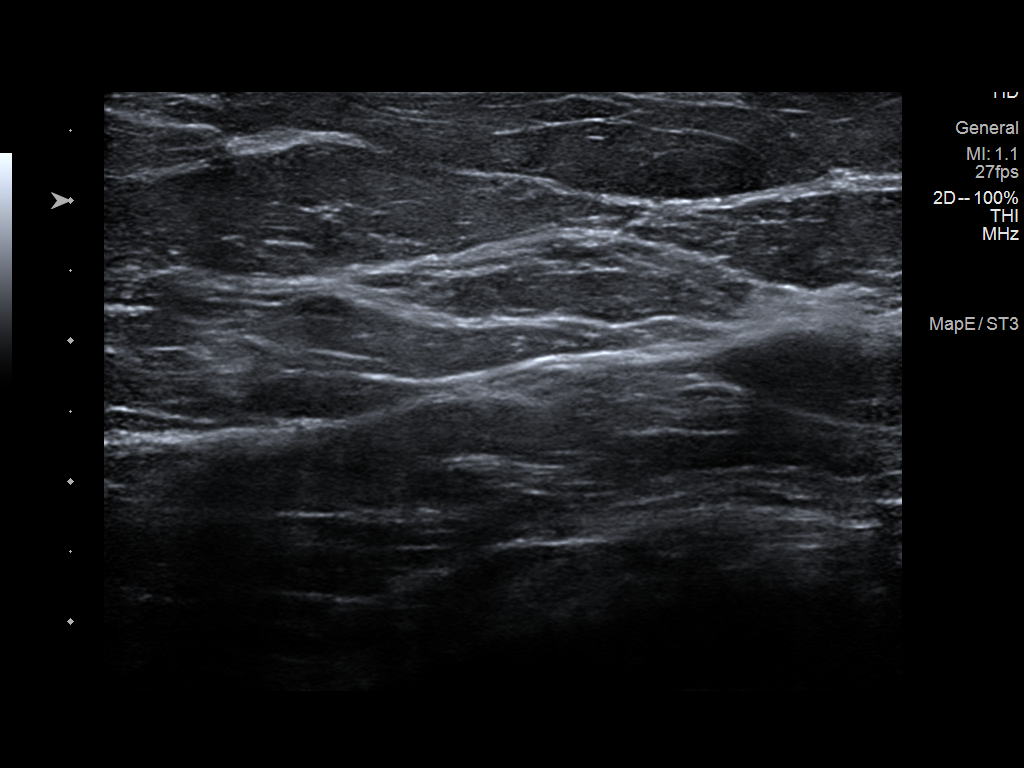
[im 5/5]
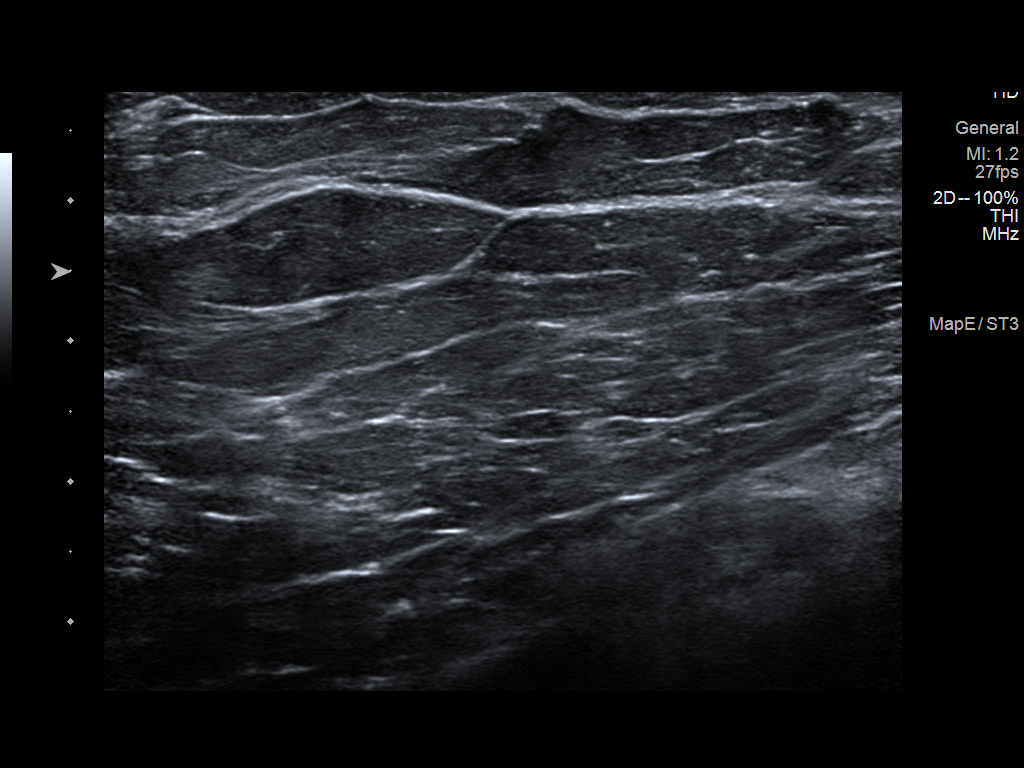

[5 of 5 positions shown; findings below may reference images not displayed]

ACR Breast Density Category b: There are scattered areas of
fibroglandular density.
FINDINGS: Spot-compression CC and MLO views of the area of concern were
obtained.

There is a circumscribed isodense mass measuring approximately 6-7
mm in the UPPER OUTER QUADRANT at posterior depth adjacent to a
benign calcification. There is no associated architectural
distortion or suspicious calcifications.

Targeted ultrasound is performed, demonstrating normal scattered
fibroglandular tissue throughout the UPPER OUTER QUADRANT with
imaging from 12 o'clock through 4 o'clock. No cyst, solid mass or
abnormal acoustic shadowing is identified.
IMPRESSION: Likely benign mass or focal asymmetry in the UPPER OUTER QUADRANT of
the LEFT breast without sonographic correlate.

RECOMMENDATION:
Diagnostic LEFT mammogram in 6 months.

I have discussed the findings and recommendations with the patient.
If applicable, a reminder letter will be sent to the patient
regarding the next appointment.

BI-RADS CATEGORY  3: Probably benign.

## 2023-05-27 ENCOUNTER — Encounter: Payer: Self-pay | Admitting: Family Medicine

## 2023-05-27 ENCOUNTER — Ambulatory Visit (INDEPENDENT_AMBULATORY_CARE_PROVIDER_SITE_OTHER): Admitting: Family Medicine

## 2023-05-27 ENCOUNTER — Ambulatory Visit: Payer: Self-pay

## 2023-05-27 VITALS — BP 140/82 | HR 64 | Temp 98.6°F | Resp 16 | Ht 62.0 in | Wt 245.2 lb

## 2023-05-27 DIAGNOSIS — E559 Vitamin D deficiency, unspecified: Secondary | ICD-10-CM

## 2023-05-27 DIAGNOSIS — Z9189 Other specified personal risk factors, not elsewhere classified: Secondary | ICD-10-CM | POA: Insufficient documentation

## 2023-05-27 DIAGNOSIS — E782 Mixed hyperlipidemia: Secondary | ICD-10-CM

## 2023-05-27 DIAGNOSIS — R9431 Abnormal electrocardiogram [ECG] [EKG]: Secondary | ICD-10-CM | POA: Diagnosis not present

## 2023-05-27 DIAGNOSIS — I10 Essential (primary) hypertension: Secondary | ICD-10-CM | POA: Diagnosis not present

## 2023-05-27 DIAGNOSIS — E890 Postprocedural hypothyroidism: Secondary | ICD-10-CM

## 2023-05-27 DIAGNOSIS — R7303 Prediabetes: Secondary | ICD-10-CM | POA: Diagnosis not present

## 2023-05-27 DIAGNOSIS — G8929 Other chronic pain: Secondary | ICD-10-CM | POA: Insufficient documentation

## 2023-05-27 DIAGNOSIS — Z6841 Body Mass Index (BMI) 40.0 and over, adult: Secondary | ICD-10-CM

## 2023-05-27 DIAGNOSIS — K219 Gastro-esophageal reflux disease without esophagitis: Secondary | ICD-10-CM

## 2023-05-27 DIAGNOSIS — M25562 Pain in left knee: Secondary | ICD-10-CM | POA: Diagnosis not present

## 2023-05-27 DIAGNOSIS — I5022 Chronic systolic (congestive) heart failure: Secondary | ICD-10-CM

## 2023-05-27 DIAGNOSIS — R0602 Shortness of breath: Secondary | ICD-10-CM | POA: Diagnosis not present

## 2023-05-27 DIAGNOSIS — E66813 Obesity, class 3: Secondary | ICD-10-CM

## 2023-05-27 HISTORY — DX: Abnormal electrocardiogram (ECG) (EKG): R94.31

## 2023-05-27 HISTORY — DX: Other chronic pain: G89.29

## 2023-05-27 HISTORY — DX: Shortness of breath: R06.02

## 2023-05-27 HISTORY — DX: Vitamin D deficiency, unspecified: E55.9

## 2023-05-27 HISTORY — DX: Other specified personal risk factors, not elsewhere classified: Z91.89

## 2023-05-27 MED ORDER — VITAMIN D (ERGOCALCIFEROL) 1.25 MG (50000 UNIT) PO CAPS: ORAL_CAPSULE | ORAL | 3 refills | Status: DC

## 2023-05-27 NOTE — Assessment & Plan Note (Addendum)
 EKG done today due to shortness of breath. - EKG - SB - 59, PR 188, QRS 86, QTc 167, Inverted T waves in V2, Vs, and V6. No ST elevation or depression - Referral to cardiology for shortness of breath and abnormal EKG.

## 2023-05-27 NOTE — Assessment & Plan Note (Signed)
 BMI 44.85, Not at goal - Patient has tried a couple weight loss medications without any efficacy. - Continue to work on a healthy diet and exercise as tolerated.

## 2023-05-27 NOTE — Assessment & Plan Note (Signed)
 Chronic knee pain with arthritis history, exacerbated by walking. No recent trauma or imaging. Cortisone injection consent obtained. - Administer cortisone injection to the knee. - Advise monitoring for signs of infection post-injection. - Instruct to avoid strenuous activities post-injection. - Consider imaging if pain persists or worsens.

## 2023-05-27 NOTE — Assessment & Plan Note (Addendum)
 History of Vitamin D  deficiency - 63.0 on 06/13/22  - Labs drawn today, Await labs/testing for assessment and recommendations

## 2023-05-27 NOTE — Assessment & Plan Note (Signed)
 Thyroid  disorder managed with levothyroxine . Symptoms suggest imbalance. Last thyroid  function test in December. - Order thyroid  function tests. - Consider adjusting levothyroxine  based on test results. FU in 2 weeks

## 2023-05-27 NOTE — Assessment & Plan Note (Signed)
 Recommend continue to work on eating healthy diet and exercise.  Lab Results  Component Value Date   HGBA1C 5.9 (H) 12/25/2022  - Labs drawn today FU in 2 weeks
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
## 2023-05-27 NOTE — Assessment & Plan Note (Signed)
Well controlled.  No changes to medicines.  Lipitor 80 mg daily Continue to work on eating a healthy diet and exercise.  Labs drawn today.

## 2023-05-27 NOTE — Assessment & Plan Note (Signed)
 EKG done today due to shortness of breath. - EKG - SB - 59, PR 188, QRS 86, QTc 167, Inverted T waves in V2, Vs, and V6. - Referral to cardiology for shortness of breath and abnormal EKG.

## 2023-05-27 NOTE — Assessment & Plan Note (Addendum)
 Acute Patient states that this feels like when her thyroid  wasn't therapeutic. - Shortness of breath happens at rest and with activity - EKG completed.  - EKG - SB - 59, PR 188, QRS 86, QTc 167, Inverted T waves in V2, Vs, and V6. No ST elevation or depression. - Referral to Cardiology

## 2023-05-27 NOTE — Assessment & Plan Note (Addendum)
 Not well controlled Patient in pain today from left knee pain. Improved from last visit.   BP Readings from Last 3 Encounters:  05/27/23 (!) 140/82  02/12/23 (!) 150/90  02/05/23 (!) 158/86    - Continue current antihypertensive regimen, valsartan -hydrochlorothiazide  160-12.5 mg by mouth once daily. - Labs drawn today - FU in 2 weeks to discuss increasing BP medication

## 2023-05-27 NOTE — Assessment & Plan Note (Signed)
 Pepcid one oral twice daily until nexium  comes in.  Acid reflux with sensation of throat being clogged. Discussed potential need for endoscopy. - Refer to GI - established with Dr. Venice Gillis for possible endoscopy.

## 2023-05-27 NOTE — Telephone Encounter (Addendum)
 Patient warm transferred by agent, she was refusing to confirm her name and DOB. Educated patient on HIPAA and that for her protection of her health and private information I need her to confirm name and DOB. Patient confirmed both. Patient refused triage, raised her voice at triage RN "I already told the other girl all this information" and demanded office visit today. Attempted to explain to patient the need for triage for safe disposition. Patient has pain in shoulder and knee, states "left" x 1 week (per agent's CRM note). Patient not willing to speak with RN. Scheduled patient for appt this morning with NP Howard Macho. Additional Notes: N/A.  Copied from CRM [id number]. Topic: Clinical - Red Word Triage >> May 27, 2023  7:38 AM Oddis Bench wrote: Red Word that prompted transfer to Nurse Triage: Patient is calling about pain in her shoulder and knees, she is not able to walk bc the pain becomes unbearable. Has been a week Reason for Disposition  [1] Angry or rude caller AND [2] doesn't respond to 5 minutes of triager counseling AND [3] sick adult (or caller)  Answer Assessment - Initial Assessment Questions 1. SITUATION:  Document reason for call.     Patient refusing to answer nurse triage questions.  2. BACKGROUND: Document any background information (e.g., prior calls, known psychiatric history)     Unknown.  3. ASSESSMENT: Document your nursing assessment.     Left knee and shoulder pain x 1 week, refused to answer any additional questions.  4. RESPONSE: Document what your response or recommendation was.     Advised patient for safety reasons we assess her acute illness or injury so that she is being seen within the right timeframe. Patient raised her voice and said she already told the other girl all her symptoms and she just needs to schedule with Dr Reinhold Carbine.  Protocols used: Difficult Call-A-AH

## 2023-05-28 ENCOUNTER — Ambulatory Visit: Payer: Self-pay | Admitting: Family Medicine

## 2023-05-28 ENCOUNTER — Other Ambulatory Visit: Payer: Self-pay | Admitting: Family Medicine

## 2023-05-28 DIAGNOSIS — E890 Postprocedural hypothyroidism: Secondary | ICD-10-CM

## 2023-05-28 DIAGNOSIS — E782 Mixed hyperlipidemia: Secondary | ICD-10-CM

## 2023-05-28 LAB — COMPREHENSIVE METABOLIC PANEL WITH GFR
ALT: 10 IU/L (ref 0–32)
AST: 17 IU/L (ref 0–40)
Albumin: 4.3 g/dL (ref 3.9–4.9)
Alkaline Phosphatase: 92 IU/L (ref 44–121)
BUN/Creatinine Ratio: 15 (ref 12–28)
BUN: 11 mg/dL (ref 8–27)
Bilirubin Total: 0.6 mg/dL (ref 0.0–1.2)
CO2: 23 mmol/L (ref 20–29)
Calcium: 9.2 mg/dL (ref 8.7–10.3)
Chloride: 100 mmol/L (ref 96–106)
Creatinine, Ser: 0.73 mg/dL (ref 0.57–1.00)
Globulin, Total: 2.9 g/dL (ref 1.5–4.5)
Glucose: 85 mg/dL (ref 70–99)
Potassium: 3.7 mmol/L (ref 3.5–5.2)
Sodium: 138 mmol/L (ref 134–144)
Total Protein: 7.2 g/dL (ref 6.0–8.5)
eGFR: 91 mL/min/{1.73_m2} (ref >59)

## 2023-05-28 LAB — CBC WITH DIFFERENTIAL/PLATELET
Basophils Absolute: 0 10*3/uL (ref 0.0–0.2)
Basos: 0 %
EOS (ABSOLUTE): 0.1 10*3/uL (ref 0.0–0.4)
Eos: 2 %
Hematocrit: 34.6 % (ref 34.0–46.6)
Hemoglobin: 10.9 g/dL — ABNORMAL LOW (ref 11.1–15.9)
Immature Grans (Abs): 0 10*3/uL (ref 0.0–0.1)
Immature Granulocytes: 0 %
Lymphocytes Absolute: 1 10*3/uL (ref 0.7–3.1)
Lymphs: 21 %
MCH: 25.8 pg — ABNORMAL LOW (ref 26.6–33.0)
MCHC: 31.5 g/dL (ref 31.5–35.7)
MCV: 82 fL (ref 79–97)
Monocytes Absolute: 0.5 10*3/uL (ref 0.1–0.9)
Monocytes: 10 %
Neutrophils Absolute: 3.3 10*3/uL (ref 1.4–7.0)
Neutrophils: 67 %
Platelets: 237 10*3/uL (ref 150–450)
RBC: 4.23 x10E6/uL (ref 3.77–5.28)
RDW: 14.6 % (ref 11.7–15.4)
WBC: 4.9 10*3/uL (ref 3.4–10.8)

## 2023-05-28 LAB — T4, FREE: Free T4: 2.06 ng/dL — ABNORMAL HIGH (ref 0.82–1.77)

## 2023-05-28 LAB — HEMOGLOBIN A1C
Est. average glucose Bld gHb Est-mCnc: 120 mg/dL
Hgb A1c MFr Bld: 5.8 % — ABNORMAL HIGH (ref 4.8–5.6)

## 2023-05-28 LAB — LIPID PANEL
Chol/HDL Ratio: 2.6 ratio (ref 0.0–4.4)
Cholesterol, Total: 215 mg/dL — ABNORMAL HIGH (ref 100–199)
HDL: 84 mg/dL (ref >39)
LDL Chol Calc (NIH): 122 mg/dL — ABNORMAL HIGH (ref 0–99)
Triglycerides: 53 mg/dL (ref 0–149)
VLDL Cholesterol Cal: 9 mg/dL (ref 5–40)

## 2023-05-28 LAB — VITAMIN D 25 HYDROXY (VIT D DEFICIENCY, FRACTURES): Vit D, 25-Hydroxy: 63.1 ng/mL (ref 30.0–100.0)

## 2023-05-28 LAB — TSH: TSH: 0.181 u[IU]/mL — ABNORMAL LOW (ref 0.450–4.500)

## 2023-05-28 MED ORDER — EZETIMIBE 10 MG PO TABS: 10.0000 mg | ORAL_TABLET | Freq: Every day | ORAL | 11 refills | Status: DC

## 2023-05-28 MED ORDER — LEVOTHYROXINE SODIUM 112 MCG PO TABS: 112.0000 ug | ORAL_TABLET | Freq: Every day | ORAL | 11 refills | Status: DC

## 2023-06-03 DIAGNOSIS — G4709 Other insomnia: Secondary | ICD-10-CM | POA: Insufficient documentation

## 2023-06-03 DIAGNOSIS — I509 Heart failure, unspecified: Secondary | ICD-10-CM | POA: Insufficient documentation

## 2023-06-10 ENCOUNTER — Ambulatory Visit: Admitting: Family Medicine

## 2023-06-15 ENCOUNTER — Ambulatory Visit

## 2023-06-17 ENCOUNTER — Ambulatory Visit

## 2023-07-02 ENCOUNTER — Ambulatory Visit

## 2023-07-02 VITALS — BP 146/88 | HR 62 | Ht 62.0 in | Wt 241.4 lb

## 2023-07-02 DIAGNOSIS — I519 Heart disease, unspecified: Secondary | ICD-10-CM

## 2023-07-02 DIAGNOSIS — I10 Essential (primary) hypertension: Secondary | ICD-10-CM

## 2023-07-02 DIAGNOSIS — R9431 Abnormal electrocardiogram [ECG] [EKG]: Secondary | ICD-10-CM

## 2023-07-02 DIAGNOSIS — I340 Nonrheumatic mitral (valve) insufficiency: Secondary | ICD-10-CM | POA: Insufficient documentation

## 2023-07-02 HISTORY — DX: Heart disease, unspecified: I51.9

## 2023-07-02 HISTORY — DX: Nonrheumatic mitral (valve) insufficiency: I34.0

## 2023-07-02 NOTE — Progress Notes (Signed)
 Cardiology Consultation:    Date:  07/02/2023   ID:  Donna Mayer, DOB 1955-07-08, MRN [id number]  PCP:  Mercy Stall, MD  Cardiologist:  Daymon Evans Darci Lykins, MD   Referring MD: Janece Means, FNP   No chief complaint on file.    ASSESSMENT AND PLAN:    Donna Mayer 67/F history of heart failure, hypertension,  hyperthyroid disease, mildly dilated aorta, moderate aortic regurgitation TEE done November 2021 noted moderate LV dysfunction, mildly dilated left atrium, moderately severe MR, mild aortic insufficiency.  Her LV the function was felt to be secondary to thyrotoxicosis and was recommended follow-up visit with echocardiogram.  Here for f reestablishing care and evaluation for abnormal EKG changes, hypertension.  Problem List Items Addressed This Visit     Essential hypertension, benign   Mentions blood pressures at home typically around systolic 130s. Target below 130/80 mmHg. Continue valsartan -hydrochlorothiazide  160 mg - 12.5 mg once daily.  Advised to continue monitoring blood pressures closely at home. If suboptimal, will recommend titrating up the dose of the current medication to 320 mg - 25 mg once daily.        Abnormal EKG - Primary   Reviewed findings about the abnormal EKG today. Changes could be related to LVH and repolarization given longstanding history of hypertension, could be a feature related to LV dysfunction, cannot entirely exclude ischemia.  However she does not have any significant symptoms suggestive of angina chest pain. No clinical signs or symptoms of heart failure.  She has excellent functional status. Will follow-up with echocardiogram and further recommendations based on echocardiogram results.      Relevant Orders   EKG 12-Lead (Completed)   LV dysfunction   Moderate LV dysfunction reported in 2021 during her thyrotoxicosis symptoms. No follow-up echocardiogram available since then to review.  Clinically she is not in any heart  failure at this time.  Will obtain repeat transthoracic echocardiogram and follow-up and further recommendations based on the results.       Mitral regurgitation   Moderate to severe LV dysfunction reported on prior echocardiogram from 2021.  This was in the setting of thyrotoxicosis.  No further follow-up imaging. Clinically not in any heart failure at this time.  Will follow-up with transthoracic echocardiogram and further recommendations based on the test results.       Return to clinic tentatively in 3 months or based on echocardiogram results.    History of Present Illness:    Donna Mayer is a 68 y.o. female who is being seen today for the evaluation of abnormal EKG at the request of Janece Means, FNP. PCP is Dr. Reinhold Carbine  Previously seen by Dr. Sandee Crook at our office 12/19/2019.  Has a history of heart failure, hypertension,  hyperthyroid disease, mildly dilated aorta, moderate aortic regurgitation TEE done November 2021 noted moderate LV dysfunction, mildly dilated left atrium, moderately severe MR, mild aortic insufficiency.  Her LV the function was felt to be secondary to thyrotoxicosis and was recommended follow-up visit with echocardiogram.  I do not see any further follow-up echocardiogram imaging since then.  Now referred by PCP in the setting of abnormal EKG changes and blood pressure suboptimal control.  Pleasant woman here for the visit by herself.  Continues to work full-time in [employer].  Does not exercise routinely but denies any symptoms of chest pain, shortness of breath, lightheadedness, palpitations.  Denies any dizziness.  Denies any significant pedal edema.  She has been modifying her diet  and has lost 15 pounds in the last 2 months.  Denies any blood in urine or stools.  Follows up with PCP closely for her thyroid  management.   Recent blood work from 05/27/2023 noted Free T4 mildly elevated 2.06, TSH 0.181. Hemoglobin A1c 5.8 suggest  prediabetes. CMP with BUN 11, creatinine 0.73, normal transaminases and alkaline phosphatase CBC with mild anemia hemoglobin 10.9 and hematocrit 34.6 Lipid panel with total cholesterol 215, triglycerides 53, HDL 84 and LDL 122.     EKG in the clinic today shows sinus rhythm heart rate 62/min, PR interval 168 ms, QRS duration 90 ms, anterolateral T wave inversions prominent in lateral precordial leads.  Comparison prior EKG from 11/16/2019 noted sinus tachycardia-125/min with similar T wave changes in lateral precordial leads.  Past Medical History:  Diagnosis Date   Abnormal EKG 05/27/2023   Acute CHF (congestive heart failure) (HCC) 11/16/2019   Anemia 11/16/2019   At risk for loss of bone density 05/27/2023   Cardiac murmur    CHF (congestive heart failure) (HCC)    Chronic pain    Chronic pain of left knee 05/27/2023   Chronic systolic heart failure (HCC)    Class 3 severe obesity due to excess calories with serious comorbidity and body mass index (BMI) of 45.0 to 49.9 in adult 06/13/2022   Depression, major, recurrent, mild (HCC) 05/24/2019   Eczema 02/10/2022   Elevated troponin 11/16/2019   Encounter for immunization 12/28/2022   Encounter for mammogram to establish baseline mammogram 02/20/2021   Encounter for osteoporosis screening in asymptomatic postmenopausal patient 12/28/2022   Essential hypertension, benign 05/13/2019   Foot pain, left 09/27/2022   GERD (gastroesophageal reflux disease)    Graves disease 05/24/2019   Graves disease 05/24/2019   Graves' disease with exophthalmos 10/06/2019   Hypertensive urgency 11/16/2019   Hyperthyroidism 05/13/2019   Hyperthyroidism 05/13/2019   IDA (iron deficiency anemia)    Impaired fasting glucose 12/28/2022   Influenza A 02/12/2023   Left thyroid  nodule 10/06/2019   Lumbar back pain 10/09/2021   Mild vitamin D  deficiency 06/13/2022   Mixed hyperlipidemia    Moderate recurrent major depression (HCC) 10/09/2021   Muscle  spasm 02/10/2022   Need for influenza vaccination 10/09/2021   Non-intractable vomiting 05/24/2019   Obesity, Class III, BMI 40-49.9 (morbid obesity) 06/06/2020   Other insomnia    Palpitations 05/19/2019   Plantar fasciitis of right foot 10/07/2022   Post-surgical hypothyroidism 01/16/2020   Prediabetes 02/20/2021   Primary insomnia    Severe episode of recurrent major depressive disorder, without psychotic features (HCC) 05/24/2019   Shortness of breath 05/27/2023   Thoracic aortic aneurysm without rupture 08/02/2020   Unexplained weight gain 12/28/2022   Vitamin D  insufficiency 05/27/2023   Weight loss 05/24/2019    Past Surgical History:  Procedure Laterality Date   BACK SURGERY     Dr Leighton Punches   CARPAL TUNNEL RELEASE Right    CESAREAN SECTION W/BTL     COLONOSCOPY  03/30/2015   Internal hoids. Mild diverticulosis. Otherwise normal colonocsopy to TI.   ESOPHAGOGASTRODUODENOSCOPY  03/30/2015   Mild gastritis. Small hiatal hernia   Lap band surgery  2010   [place]   SKIN GRAFT     TEE WITHOUT CARDIOVERSION N/A 11/21/2019   Procedure: TRANSESOPHAGEAL ECHOCARDIOGRAM (TEE);  Surgeon: Elmyra Haggard, MD;  Location: [facility] ENDOSCOPY;  Service: Cardiovascular;  Laterality: N/A;   THYROIDECTOMY N/A 12/07/2019   Procedure: TOTAL THYROIDECTOMY;  Surgeon: Virgina Grills, MD;  Location: [facility] OR;  Service: ENT;  Laterality: N/A;    Current Medications: Current Meds  Medication Sig   atorvastatin  (LIPITOR) 80 MG tablet TAKE ONE TABLET BY MOUTH EVERYDAY AT BEDTIME   esomeprazole  (NEXIUM ) 40 MG capsule Take 1 capsule (40 mg total) by mouth daily at 12 noon.   levothyroxine  (SYNTHROID ) 112 MCG tablet Take 1 tablet (112 mcg total) by mouth daily.   Polyethyl Glycol-Propyl Glycol (SYSTANE) 0.4-0.3 % SOLN Apply 1 drop to eye 4 (four) times daily as needed. In both eyes.   tiZANidine  (ZANAFLEX ) 4 MG tablet Take 1 tablet (4 mg total) by mouth every 8 (eight) hours as needed for muscle spasms.    valsartan -hydrochlorothiazide  (DIOVAN -HCT) 160-12.5 MG tablet Take 1 tablet by mouth daily.   Vitamin D , Ergocalciferol , (DRISDOL ) 1.25 MG (50000 UNIT) CAPS capsule TAKE ONE CAPSULE BY MOUTH weekly on monday, wednesday, and friday   zolpidem  (AMBIEN  CR) 12.5 MG CR tablet TAKE 1 TABLET BY MOUTH AT BEDTIME AS NEEDED     Allergies:   Patient has no known allergies.   Social History   Socioeconomic History   Marital status: Divorced    Spouse name: Not on file   Number of children: 2   Years of education: Not on file   Highest education level: Not on file  Occupational History   Occupation: disabled  Tobacco Use   Smoking status: Never   Smokeless tobacco: Never  Vaping Use   Vaping status: Never Used  Substance and Sexual Activity   Alcohol use: Never   Drug use: Never   Sexual activity: Not Currently  Other Topics Concern   Not on file  Social History Narrative   Not on file   Social Drivers of Health   Financial Resource Strain: Low Risk  (12/25/2022)   Overall Financial Resource Strain (CARDIA)    Difficulty of Paying Living Expenses: Not hard at all  Food Insecurity: No Food Insecurity (02/05/2023)   Hunger Vital Sign    Worried About Running Out of Food in the Last Year: Never true    Ran Out of Food in the Last Year: Never true  Transportation Needs: No Transportation Needs (12/25/2022)   PRAPARE - Administrator, Civil Service (Medical): No    Lack of Transportation (Non-Medical): No  Physical Activity: Insufficiently Active (02/05/2023)   Exercise Vital Sign    Days of Exercise per Week: 7 days    Minutes of Exercise per Session: 20 min  Stress: No Stress Concern Present (12/25/2022)   [employer] of Occupational Health - Occupational Stress Questionnaire    Feeling of Stress : Not at all  Social Connections: Moderately Isolated (12/25/2022)   Social Connection and Isolation Panel    Frequency of Communication with Friends and Family: More than  three times a week    Frequency of Social Gatherings with Friends and Family: More than three times a week    Attends Religious Services: More than 4 times per year    Active Member of [employer] or Organizations: No    Attends Banker Meetings: Never    Marital Status: Divorced     Family History: The patient's family history includes Diabetes in her mother and sister; Heart attack in her father; Sarcoidosis in her sister; Stroke in her mother. There is no history of Colon cancer, Esophageal cancer, Rectal cancer, Stomach cancer, or Breast cancer. ROS:   Please see the history of present illness.    All 14 point review of systems  negative except as described per history of present illness.  EKGs/Labs/Other Studies Reviewed:    The following studies were reviewed today:   EKG:  EKG Interpretation Date/Time:  Thursday July 02 2023 15:02:47 EDT Ventricular Rate:  62 PR Interval:  168 QRS Duration:  90 QT Interval:  398 QTC Calculation: 403 R Axis:   -6  Text Interpretation: Normal sinus rhythm Septal infarct , age undetermined T wave abnormality, consider lateral ischemia Abnormal ECG When compared with ECG of 16-Nov-2019 21:58, PREVIOUS ECG IS PRESENT Confirmed by Bertha Broad reddy [phone]) on 07/02/2023 3:46:51 PM    Recent Labs: 05/27/2023: ALT 10; BUN 11; Creatinine, Ser 0.73; Hemoglobin 10.9; Platelets 237; Potassium 3.7; Sodium 138; TSH 0.181  Recent Lipid Panel    Component Value Date/Time   CHOL 215 (H) 05/27/2023 0931   TRIG 53 05/27/2023 0931   HDL 84 05/27/2023 0931   CHOLHDL 2.6 05/27/2023 0931   LDLCALC 122 (H) 05/27/2023 0931    Physical Exam:    VS:  BP (!) 146/88   Pulse 62   Ht 5' 2 (1.575 m)   Wt 241 lb 6.4 oz (109.5 kg)   LMP 04/14/2010   SpO2 96%   BMI 44.15 kg/m     Wt Readings from Last 3 Encounters:  07/02/23 241 lb 6.4 oz (109.5 kg)  05/27/23 245 lb 3.2 oz (111.2 kg)  02/12/23 253 lb 12.8 oz (115.1 kg)     GENERAL:  Well  nourished, well developed in no acute distress NECK: No JVD; No carotid bruits CARDIAC: RRR, S1 and S2 present, no murmurs, no rubs, no gallops CHEST:  Clear to auscultation without rales, wheezing or rhonchi  Extremities: No pitting pedal edema. Pulses bilaterally symmetric with radial 2+ and dorsalis pedis 2+ NEUROLOGIC:  Alert and oriented x 3  Medication Adjustments/Labs and Tests Ordered: Current medicines are reviewed at length with the patient today.  Concerns regarding medicines are outlined above.  Orders Placed This Encounter  Procedures   EKG 12-Lead   No orders of the defined types were placed in this encounter.   Signed, Lura Sallies, MD, MPH, [facility]. 07/02/2023 4:08 PM    [facility]

## 2023-07-02 NOTE — Assessment & Plan Note (Signed)
 Moderate LV dysfunction reported in 2021 during her thyrotoxicosis symptoms. No follow-up echocardiogram available since then to review.  Clinically she is not in any heart failure at this time.  Will obtain repeat transthoracic echocardiogram and follow-up and further recommendations based on the results.

## 2023-07-02 NOTE — Addendum Note (Signed)
 Addended by: Aurelio Leer I on: 07/02/2023 04:19 PM   Modules accepted: Orders

## 2023-07-02 NOTE — Assessment & Plan Note (Signed)
 Mentions blood pressures at home typically around systolic 130s. Target below 130/80 mmHg. Continue valsartan -hydrochlorothiazide  160 mg - 12.5 mg once daily.  Advised to continue monitoring blood pressures closely at home. If suboptimal, will recommend titrating up the dose of the current medication to 320 mg - 25 mg once daily.

## 2023-07-02 NOTE — Assessment & Plan Note (Signed)
 Moderate to severe LV dysfunction reported on prior echocardiogram from 2021.  This was in the setting of thyrotoxicosis.  No further follow-up imaging. Clinically not in any heart failure at this time.  Will follow-up with transthoracic echocardiogram and further recommendations based on the test results.

## 2023-07-02 NOTE — Patient Instructions (Signed)
 Medication Instructions:  Your physician recommends that you continue on your current medications as directed. Please refer to the Current Medication list given to you today.  *If you need a refill on your cardiac medications before your next appointment, please call your pharmacy*  Lab Work: None If you have labs (blood work) drawn today and your tests are completely normal, you will receive your results only by: MyChart Message (if you have MyChart) OR A paper copy in the mail If you have any lab test that is abnormal or we need to change your treatment, we will call you to review the results.  Testing/Procedures: Your physician has requested that you have an echocardiogram. Echocardiography is a painless test that uses sound waves to create images of your heart. It provides your doctor with information about the size and shape of your heart and how well your heart's chambers and valves are working. This procedure takes approximately one hour. There are no restrictions for this procedure. Please do NOT wear cologne, perfume, aftershave, or lotions (deodorant is allowed). Please arrive 15 minutes prior to your appointment time.  Please note: We ask at that you not bring children with you during ultrasound (echo/ vascular) testing. Due to room size and safety concerns, children are not allowed in the ultrasound rooms during exams. Our front office staff cannot provide observation of children in our lobby area while testing is being conducted. An adult accompanying a patient to their appointment will only be allowed in the ultrasound room at the discretion of the ultrasound technician under special circumstances. We apologize for any inconvenience.   Follow-Up: At [facility], you and your health needs are our priority.  As part of our continuing mission to provide you with exceptional heart care, our providers are all part of one team.  This team includes your primary Cardiologist  (physician) and Advanced Practice Providers or APPs (Physician Assistants and Nurse Practitioners) who all work together to provide you with the care you need, when you need it.  Your next appointment:   3 month(s)  Provider:   Bertha Broad, MD    We recommend signing up for the patient portal called "MyChart".  Sign up information is provided on this After Visit Summary.  MyChart is used to connect with patients for Virtual Visits (Telemedicine).  Patients are able to view lab/test results, encounter notes, upcoming appointments, etc.  Non-urgent messages can be sent to your provider as well.   To learn more about what you can do with MyChart, go to [email or web address].   Other Instructions None

## 2023-07-02 NOTE — Assessment & Plan Note (Signed)
 Reviewed findings about the abnormal EKG today. Changes could be related to LVH and repolarization given longstanding history of hypertension, could be a feature related to LV dysfunction, cannot entirely exclude ischemia.  However she does not have any significant symptoms suggestive of angina chest pain. No clinical signs or symptoms of heart failure.  She has excellent functional status. Will follow-up with echocardiogram and further recommendations based on echocardiogram results.

## 2023-07-07 ENCOUNTER — Ambulatory Visit (INDEPENDENT_AMBULATORY_CARE_PROVIDER_SITE_OTHER): Admitting: Family Medicine

## 2023-07-07 ENCOUNTER — Other Ambulatory Visit: Payer: Self-pay | Admitting: Family Medicine

## 2023-07-07 VITALS — BP 126/78 | HR 69 | Temp 98.0°F | Ht 62.0 in | Wt 242.0 lb

## 2023-07-07 DIAGNOSIS — E782 Mixed hyperlipidemia: Secondary | ICD-10-CM

## 2023-07-07 DIAGNOSIS — I10 Essential (primary) hypertension: Secondary | ICD-10-CM

## 2023-07-07 DIAGNOSIS — F5101 Primary insomnia: Secondary | ICD-10-CM

## 2023-07-07 DIAGNOSIS — K5904 Chronic idiopathic constipation: Secondary | ICD-10-CM | POA: Diagnosis not present

## 2023-07-07 DIAGNOSIS — E559 Vitamin D deficiency, unspecified: Secondary | ICD-10-CM

## 2023-07-07 DIAGNOSIS — Z6841 Body Mass Index (BMI) 40.0 and over, adult: Secondary | ICD-10-CM

## 2023-07-07 DIAGNOSIS — E890 Postprocedural hypothyroidism: Secondary | ICD-10-CM

## 2023-07-07 DIAGNOSIS — I5022 Chronic systolic (congestive) heart failure: Secondary | ICD-10-CM | POA: Diagnosis not present

## 2023-07-07 DIAGNOSIS — E66813 Obesity, class 3: Secondary | ICD-10-CM

## 2023-07-07 MED ORDER — VALSARTAN 160 MG PO TABS: 160.0000 mg | ORAL_TABLET | Freq: Every day | ORAL | 0 refills | Status: DC

## 2023-07-07 MED ORDER — VITAMIN D (ERGOCALCIFEROL) 1.25 MG (50000 UNIT) PO CAPS: ORAL_CAPSULE | ORAL | 3 refills | Status: DC

## 2023-07-07 MED ORDER — ATORVASTATIN CALCIUM 80 MG PO TABS: ORAL_TABLET | ORAL | 0 refills | Status: DC

## 2023-07-07 MED ORDER — ZOLPIDEM TARTRATE ER 12.5 MG PO TBCR: EXTENDED_RELEASE_TABLET | ORAL | 0 refills | Status: DC

## 2023-07-07 MED ORDER — LUBIPROSTONE 8 MCG PO CAPS
8.0000 ug | ORAL_CAPSULE | Freq: Two times a day (BID) | ORAL | 0 refills | Status: DC
Start: 2023-07-07 — End: 2023-07-14

## 2023-07-07 NOTE — Progress Notes (Signed)
 Subjective:  Patient ID: Donna Mayer, female    DOB: June 15, 1955  Age: 68 y.o. MRN: [id number]  Chief Complaint  Patient presents with   Medical Management of Chronic Issues    HPI: Insomnia - taking ambien  12.5 mg before bed  Hyperlipidemia - lipitor 80 mg daily  GERD - Nexium  40 mg daily  Hypothyroidism - synthroid  112 mcg daily  HTN - Diovan  160-12.5 mg daily- causes her to feel bloated. HTN is well controlled.   Constipation: uses dulcolax every 3 days in order for to have a bm. Patient has tried miralax and fiber. Tried coconut oil and Magnesium  citrate.      07/07/2023    3:07 PM 02/05/2023    2:18 PM 12/25/2022    3:33 PM 09/22/2022    2:25 PM 06/13/2022    8:14 AM  Depression screen PHQ 2/9  Decreased Interest 0 0 2 0 2  Down, Depressed, Hopeless 0 0 0 0 2  PHQ - 2 Score 0 0 2 0 4  Altered sleeping 0 0 2  2  Tired, decreased energy 0 0 2  2  Change in appetite 0 2 0  2  Feeling bad or failure about yourself  0 0 0  2  Trouble concentrating 0 0 0  0  Moving slowly or fidgety/restless 0 0 0  0  Suicidal thoughts 0 0 0  0  PHQ-9 Score 0 2 6  12   Difficult doing work/chores Not difficult at all Not difficult at all Not difficult at all  Not difficult at all        02/05/2023    2:17 PM  Fall Risk   Falls in the past year? 0  Number falls in past yr: 0  Injury with Fall? 0  Risk for fall due to : No Fall Risks  Follow up Falls evaluation completed    Patient Care Team: Sherre Clapper, MD as PCP - General (Family Medicine) Hyacinth Warren PARAS, FNP (Endocrinology) Melanee Rosalynn Decent, MD as Referring Physician (Endocrinology) Carlie Clark, MD as Consulting Physician (Otolaryngology) Nyle Rankin POUR, [facility] (Inactive) (Pharmacist)   Review of Systems  Constitutional:  Negative for chills, fatigue and fever.  HENT:  Negative for congestion, ear pain, rhinorrhea and sore throat.   Respiratory:  Negative for cough and shortness of breath.   Cardiovascular:  Negative  for chest pain.  Gastrointestinal:  Negative for abdominal pain, constipation, diarrhea, nausea and vomiting.  Genitourinary:  Negative for dysuria and urgency.  Musculoskeletal:  Positive for arthralgias and back pain. Negative for myalgias.  Neurological:  Negative for dizziness, weakness, light-headedness and headaches.  Psychiatric/Behavioral:  Negative for dysphoric mood. The patient is not nervous/anxious.     Current Outpatient Medications on File Prior to Visit  Medication Sig Dispense Refill   esomeprazole  (NEXIUM ) 40 MG capsule Take 1 capsule (40 mg total) by mouth daily at 12 noon. 90 capsule 0   Polyethyl Glycol-Propyl Glycol (SYSTANE) 0.4-0.3 % SOLN Apply 1 drop to eye 4 (four) times daily as needed. In both eyes. 30 mL 3   No current facility-administered medications on file prior to visit.   Past Medical History:  Diagnosis Date   Abnormal EKG 05/27/2023   Acute CHF (congestive heart failure) (HCC) 11/16/2019   Anemia 11/16/2019   At risk for loss of bone density 05/27/2023   Cardiac murmur    CHF (congestive heart failure) (HCC)    Chronic pain    Chronic pain of left  knee 05/27/2023   Chronic systolic heart failure (HCC)    Class 3 severe obesity due to excess calories with serious comorbidity and body mass index (BMI) of 45.0 to 49.9 in adult 06/13/2022   Depression, major, recurrent, mild (HCC) 05/24/2019   Eczema 02/10/2022   Elevated troponin 11/16/2019   Encounter for immunization 12/28/2022   Encounter for mammogram to establish baseline mammogram 02/20/2021   Encounter for osteoporosis screening in asymptomatic postmenopausal patient 12/28/2022   Essential hypertension, benign 05/13/2019   Foot pain, left 09/27/2022   GERD (gastroesophageal reflux disease)    Graves disease 05/24/2019   Graves disease 05/24/2019   Graves' disease with exophthalmos 10/06/2019   Hypertensive urgency 11/16/2019   Hyperthyroidism 05/13/2019   Hyperthyroidism 05/13/2019    IDA (iron deficiency anemia)    Impaired fasting glucose 12/28/2022   Influenza A 02/12/2023   Left thyroid  nodule 10/06/2019   Lumbar back pain 10/09/2021   Mild vitamin D  deficiency 06/13/2022   Mixed hyperlipidemia    Moderate recurrent major depression (HCC) 10/09/2021   Muscle spasm 02/10/2022   Need for influenza vaccination 10/09/2021   Non-intractable vomiting 05/24/2019   Obesity, Class III, BMI 40-49.9 (morbid obesity) 06/06/2020   Other insomnia    Palpitations 05/19/2019   Plantar fasciitis of right foot 10/07/2022   Post-surgical hypothyroidism 01/16/2020   Prediabetes 02/20/2021   Primary insomnia    Severe episode of recurrent major depressive disorder, without psychotic features (HCC) 05/24/2019   Shortness of breath 05/27/2023   Thoracic aortic aneurysm without rupture 08/02/2020   Unexplained weight gain 12/28/2022   Vitamin D  insufficiency 05/27/2023   Weight loss 05/24/2019   Past Surgical History:  Procedure Laterality Date   BACK SURGERY     Dr Duwayne   CARPAL TUNNEL RELEASE Right    CESAREAN SECTION W/BTL     COLONOSCOPY  03/30/2015   Internal hoids. Mild diverticulosis. Otherwise normal colonocsopy to TI.   ESOPHAGOGASTRODUODENOSCOPY  03/30/2015   Mild gastritis. Small hiatal hernia   Lap band surgery  2010   [place]   SKIN GRAFT     TEE WITHOUT CARDIOVERSION N/A 11/21/2019   Procedure: TRANSESOPHAGEAL ECHOCARDIOGRAM (TEE);  Surgeon: Okey Vina GAILS, MD;  Location: [facility] ENDOSCOPY;  Service: Cardiovascular;  Laterality: N/A;   THYROIDECTOMY N/A 12/07/2019   Procedure: TOTAL THYROIDECTOMY;  Surgeon: Carlie Clark, MD;  Location: [facility] OR;  Service: ENT;  Laterality: N/A;    Family History  Problem Relation Age of Onset   Diabetes Mother    Stroke Mother    Heart attack Father    Diabetes Sister    Sarcoidosis Sister    Colon cancer Neg Hx    Esophageal cancer Neg Hx    Rectal cancer Neg Hx    Stomach cancer Neg Hx    Breast cancer Neg Hx     Social History   Socioeconomic History   Marital status: Divorced    Spouse name: Not on file   Number of children: 2   Years of education: Not on file   Highest education level: Not on file  Occupational History   Occupation: disabled  Tobacco Use   Smoking status: Never   Smokeless tobacco: Never  Vaping Use   Vaping status: Never Used  Substance and Sexual Activity   Alcohol use: Never   Drug use: Never   Sexual activity: Not Currently  Other Topics Concern   Not on file  Social History Narrative   Not on file   Social  Drivers of Health   Financial Resource Strain: Low Risk  (12/25/2022)   Overall Financial Resource Strain (CARDIA)    Difficulty of Paying Living Expenses: Not hard at all  Food Insecurity: No Food Insecurity (02/05/2023)   Hunger Vital Sign    Worried About Running Out of Food in the Last Year: Never true    Ran Out of Food in the Last Year: Never true  Transportation Needs: No Transportation Needs (07/07/2023)   PRAPARE - Administrator, Civil Service (Medical): No    Lack of Transportation (Non-Medical): No  Physical Activity: Insufficiently Active (02/05/2023)   Exercise Vital Sign    Days of Exercise per Week: 7 days    Minutes of Exercise per Session: 20 min  Stress: No Stress Concern Present (12/25/2022)   [employer] of Occupational Health - Occupational Stress Questionnaire    Feeling of Stress : Not at all  Social Connections: Moderately Isolated (12/25/2022)   Social Connection and Isolation Panel    Frequency of Communication with Friends and Family: More than three times a week    Frequency of Social Gatherings with Friends and Family: More than three times a week    Attends Religious Services: More than 4 times per year    Active Member of [employer] or Organizations: No    Attends Banker Meetings: Never    Marital Status: Divorced    Objective:  BP 126/78   Pulse 69   Temp 98 F (36.7 C)   Ht 5' 2  (1.575 m)   Wt 242 lb (109.8 kg)   LMP 04/14/2010   SpO2 98%   BMI 44.26 kg/m      07/07/2023    3:06 PM 07/02/2023    3:00 PM 05/27/2023    8:32 AM  BP/Weight  Systolic BP 126 146 140  Diastolic BP 78 88 82  Wt. (Lbs) 242 241.4 245.2  BMI 44.26 kg/m2 44.15 kg/m2 44.85 kg/m2    Physical Exam Vitals reviewed.  Constitutional:      Appearance: Normal appearance. She is obese.  Neck:     Vascular: No carotid bruit.   Cardiovascular:     Rate and Rhythm: Normal rate and regular rhythm.     Heart sounds: Normal heart sounds.  Pulmonary:     Effort: Pulmonary effort is normal. No respiratory distress.     Breath sounds: Normal breath sounds.  Abdominal:     General: Abdomen is flat. Bowel sounds are normal.     Palpations: Abdomen is soft.     Tenderness: There is no abdominal tenderness.   Musculoskeletal:     Right lower leg: No edema.     Left lower leg: No edema.   Neurological:     Mental Status: She is alert and oriented to person, place, and time.   Psychiatric:        Mood and Affect: Mood normal.        Behavior: Behavior normal.         Lab Results  Component Value Date   WBC 4.9 05/27/2023   HGB 10.9 (L) 05/27/2023   HCT 34.6 05/27/2023   PLT 237 05/27/2023   GLUCOSE 85 05/27/2023   CHOL 215 (H) 05/27/2023   TRIG 53 05/27/2023   HDL 84 05/27/2023   LDLCALC 122 (H) 05/27/2023   ALT 10 05/27/2023   AST 17 05/27/2023   NA 138 05/27/2023   K 3.7 05/27/2023   CL 100 05/27/2023  CREATININE 0.73 05/27/2023   BUN 11 05/27/2023   CO2 23 05/27/2023   TSH 0.269 (L) 07/07/2023   INR 1.4 (H) 11/16/2019   HGBA1C 5.8 (H) 05/27/2023      Assessment & Plan:  Essential hypertension, benign Assessment & Plan: Bp is at goal, but patient is concerned the medicine is causing her bloating. Change valsartan /hydrochlorothiazide  to valsartan  160 mg daily.    Orders: -     Valsartan ; Take 1 tablet (160 mg total) by mouth daily.  Dispense: 30 tablet;  Refill: 0  Primary insomnia Assessment & Plan: The current medical regimen is effective;  continue present plan and medications. Continue ambien  cr 12.5 mg before bed.   Orders: -     Zolpidem  Tartrate ER; TAKE 1 TABLET BY MOUTH AT BEDTIME AS NEEDED  Dispense: 30 tablet; Refill: 0  Vitamin D  insufficiency Assessment & Plan: The current medical regimen is effective;  continue present plan and medications. Continue vitamin D  three times a week.   Orders: -     Vitamin D  (Ergocalciferol ); TAKE ONE CAPSULE BY MOUTH weekly on monday, wednesday, and friday  Dispense: 39 capsule; Refill: 3  Post-surgical hypothyroidism Assessment & Plan: Not at goal. Checked labs.  Decrease synthroid  to 100 mcg once daily.   Orders: -     T4, free -     TSH  Mixed hyperlipidemia Assessment & Plan: Well controlled.  No changes to medicines. Lipitor 80 mg daily Continue to work on eating a healthy diet and exercise.  Labs drawn today.    Orders: -     Atorvastatin  Calcium ; TAKE ONE TABLET BY MOUTH EVERYDAY AT BEDTIME  Dispense: 90 tablet; Refill: 0  Chronic systolic heart failure [facility]) Assessment & Plan: Echo in 2021. Not rechecked since. Management per specialist.     Class 3 severe obesity due to excess calories with serious comorbidity and body mass index (BMI) of 40.0 to 44.9 in adult Assessment & Plan: Recommend continue to work on eating healthy diet and exercise.    Chronic idiopathic constipation Assessment & Plan: For constipation: Amitiza  8 mcg once twice daily.    Orders: -     Lubiprostone ; Take 1 capsule (8 mcg total) by mouth 2 (two) times daily with a meal.  Dispense: 60 capsule; Refill: 0     Meds ordered this encounter  Medications   zolpidem  (AMBIEN  CR) 12.5 MG CR tablet    Sig: TAKE 1 TABLET BY MOUTH AT BEDTIME AS NEEDED    Dispense:  30 tablet    Refill:  0   valsartan  (DIOVAN ) 160 MG tablet    Sig: Take 1 tablet (160 mg total) by mouth daily.    Dispense:   30 tablet    Refill:  0   lubiprostone  (AMITIZA ) 8 MCG capsule    Sig: Take 1 capsule (8 mcg total) by mouth 2 (two) times daily with a meal.    Dispense:  60 capsule    Refill:  0   Vitamin D , Ergocalciferol , (DRISDOL ) 1.25 MG (50000 UNIT) CAPS capsule    Sig: TAKE ONE CAPSULE BY MOUTH weekly on monday, wednesday, and friday    Dispense:  39 capsule    Refill:  3   atorvastatin  (LIPITOR) 80 MG tablet    Sig: TAKE ONE TABLET BY MOUTH EVERYDAY AT BEDTIME    Dispense:  90 tablet    Refill:  0    Orders Placed This Encounter  Procedures   T4, free   TSH  Follow-up: Return in about 20 days (around 07/27/2023) for chronic follow up.   I,Katherina A Bramblett,acting as a scribe for Abigail Free, MD.,have documented all relevant documentation on the behalf of Abigail Free, MD,as directed by  Abigail Free, MD while in the presence of Abigail Free, MD.   An After Visit Summary was printed and given to the patient.  Abigail Free, MD [facility] [phone]

## 2023-07-07 NOTE — Patient Instructions (Addendum)
 For constipation: Amitiza 8 mcg once twice daily.  Change valsartan /hydrochlorothiazide  to valsartan  160 mg daily.

## 2023-07-08 ENCOUNTER — Ambulatory Visit: Payer: Self-pay | Admitting: Family Medicine

## 2023-07-08 ENCOUNTER — Other Ambulatory Visit: Payer: Self-pay

## 2023-07-08 DIAGNOSIS — E890 Postprocedural hypothyroidism: Secondary | ICD-10-CM

## 2023-07-08 LAB — T4, FREE: Free T4: 2.09 ng/dL — ABNORMAL HIGH (ref 0.82–1.77)

## 2023-07-08 LAB — TSH: TSH: 0.269 u[IU]/mL — ABNORMAL LOW (ref 0.450–4.500)

## 2023-07-08 MED ORDER — LEVOTHYROXINE SODIUM 100 MCG PO TABS: 100.0000 ug | ORAL_TABLET | Freq: Every day | ORAL | 3 refills | Status: DC

## 2023-07-12 ENCOUNTER — Encounter: Payer: Self-pay | Admitting: Family Medicine

## 2023-07-12 DIAGNOSIS — K5904 Chronic idiopathic constipation: Secondary | ICD-10-CM

## 2023-07-12 HISTORY — DX: Chronic idiopathic constipation: K59.04

## 2023-07-12 NOTE — Assessment & Plan Note (Signed)
Well controlled.  No changes to medicines.  Lipitor 80 mg daily Continue to work on eating a healthy diet and exercise.  Labs drawn today.

## 2023-07-12 NOTE — Assessment & Plan Note (Signed)
 The current medical regimen is effective;  continue present plan and medications. Continue ambien  cr 12.5 mg before bed.

## 2023-07-12 NOTE — Assessment & Plan Note (Signed)
 Recommend continue to work on eating healthy diet and exercise.

## 2023-07-12 NOTE — Assessment & Plan Note (Signed)
 Echo in 2021. Not rechecked since. Management per specialist.

## 2023-07-12 NOTE — Assessment & Plan Note (Signed)
 Bp is at goal, but patient is concerned the medicine is causing her bloating. Change valsartan /hydrochlorothiazide  to valsartan  160 mg daily.

## 2023-07-12 NOTE — Assessment & Plan Note (Signed)
 For constipation: Amitiza  8 mcg once twice daily.

## 2023-07-12 NOTE — Assessment & Plan Note (Signed)
 The current medical regimen is effective;  continue present plan and medications. Continue vitamin D  three times a week.

## 2023-07-12 NOTE — Assessment & Plan Note (Signed)
 Not at goal. Checked labs.  Decrease synthroid  to 100 mcg once daily.

## 2023-07-14 ENCOUNTER — Other Ambulatory Visit: Payer: Self-pay | Admitting: Family Medicine

## 2023-07-14 ENCOUNTER — Telehealth: Payer: Self-pay | Admitting: Family Medicine

## 2023-07-14 DIAGNOSIS — E890 Postprocedural hypothyroidism: Secondary | ICD-10-CM

## 2023-07-14 DIAGNOSIS — F5101 Primary insomnia: Secondary | ICD-10-CM

## 2023-07-14 DIAGNOSIS — E559 Vitamin D deficiency, unspecified: Secondary | ICD-10-CM

## 2023-07-14 DIAGNOSIS — K5904 Chronic idiopathic constipation: Secondary | ICD-10-CM

## 2023-07-14 DIAGNOSIS — E782 Mixed hyperlipidemia: Secondary | ICD-10-CM

## 2023-07-14 DIAGNOSIS — I10 Essential (primary) hypertension: Secondary | ICD-10-CM

## 2023-07-14 MED ORDER — VALSARTAN 160 MG PO TABS
160.0000 mg | ORAL_TABLET | Freq: Every day | ORAL | 0 refills | Status: DC
Start: 2023-07-14 — End: 2023-08-03

## 2023-07-14 MED ORDER — LUBIPROSTONE 8 MCG PO CAPS: 8.0000 ug | ORAL_CAPSULE | Freq: Two times a day (BID) | ORAL | 3 refills | Status: DC

## 2023-07-14 MED ORDER — VITAMIN D (ERGOCALCIFEROL) 1.25 MG (50000 UNIT) PO CAPS: ORAL_CAPSULE | ORAL | 3 refills | Status: DC

## 2023-07-14 MED ORDER — ESOMEPRAZOLE MAGNESIUM 40 MG PO CPDR: 40.0000 mg | DELAYED_RELEASE_CAPSULE | Freq: Every day | ORAL | 3 refills | Status: AC

## 2023-07-14 MED ORDER — ATORVASTATIN CALCIUM 80 MG PO TABS: ORAL_TABLET | ORAL | 1 refills | Status: DC

## 2023-07-14 MED ORDER — ZOLPIDEM TARTRATE ER 12.5 MG PO TBCR: EXTENDED_RELEASE_TABLET | ORAL | 1 refills | Status: DC

## 2023-07-14 NOTE — Telephone Encounter (Signed)
 [facility] [phone]

## 2023-07-20 ENCOUNTER — Other Ambulatory Visit: Payer: Self-pay

## 2023-07-21 ENCOUNTER — Telehealth: Payer: Self-pay | Admitting: Family Medicine

## 2023-07-21 NOTE — Telephone Encounter (Signed)
 Copied from CRM [id number]. Topic: Clinical - Prescription Issue >> Jul 21, 2023  8:26 AM Macario HERO wrote: Reason for CRM: Patient stated that [facility] stated that patient medication: zolpidem  (AMBIEN  CR) 12.5 MG CR tablet [[id number]] was cancelled and need more information from Dr. Sherre.

## 2023-07-31 ENCOUNTER — Ambulatory Visit

## 2023-07-31 DIAGNOSIS — I519 Heart disease, unspecified: Secondary | ICD-10-CM | POA: Diagnosis not present

## 2023-07-31 DIAGNOSIS — I10 Essential (primary) hypertension: Secondary | ICD-10-CM | POA: Diagnosis not present

## 2023-07-31 DIAGNOSIS — I340 Nonrheumatic mitral (valve) insufficiency: Secondary | ICD-10-CM | POA: Diagnosis not present

## 2023-07-31 DIAGNOSIS — R9431 Abnormal electrocardiogram [ECG] [EKG]: Secondary | ICD-10-CM

## 2023-07-31 LAB — ECHOCARDIOGRAM COMPLETE
Area-P 1/2: 2.85 cm2
MV M vel: 5.18 m/s
MV Peak grad: 107.3 mmHg
P 1/2 time: 1125 ms
S' Lateral: 3.2 cm

## 2023-08-01 ENCOUNTER — Ambulatory Visit: Payer: Self-pay

## 2023-08-03 ENCOUNTER — Other Ambulatory Visit: Payer: Self-pay | Admitting: Family Medicine

## 2023-08-03 ENCOUNTER — Encounter: Payer: Self-pay | Admitting: Family Medicine

## 2023-08-03 DIAGNOSIS — F5101 Primary insomnia: Secondary | ICD-10-CM

## 2023-08-03 DIAGNOSIS — I10 Essential (primary) hypertension: Secondary | ICD-10-CM

## 2023-08-03 DIAGNOSIS — E782 Mixed hyperlipidemia: Secondary | ICD-10-CM

## 2023-08-03 MED ORDER — ATORVASTATIN CALCIUM 80 MG PO TABS: ORAL_TABLET | ORAL | 1 refills | Status: DC

## 2023-08-03 MED ORDER — ZOLPIDEM TARTRATE ER 12.5 MG PO TBCR: EXTENDED_RELEASE_TABLET | ORAL | 1 refills | Status: DC

## 2023-08-03 MED ORDER — VALSARTAN 160 MG PO TABS: 160.0000 mg | ORAL_TABLET | Freq: Every day | ORAL | 0 refills | Status: DC

## 2023-08-03 NOTE — Telephone Encounter (Signed)
 Copied from CRM [id number]. Topic: Clinical - Medication Refill >> Aug 03, 2023  8:31 AM Rosaria E wrote: Medication:  zolpidem  (AMBIEN  CR) 12.5 MG CR tablet valsartan  (DIOVAN ) 160 MG tablet atorvastatin  (LIPITOR) 80 MG tablet  Has the patient contacted their pharmacy? Yes (Agent: If no, request that the patient contact the pharmacy for the refill. If patient does not wish to contact the pharmacy document the reason why and proceed with request.) (Agent: If yes, when and what did the pharmacy advise?)  This is the patient's preferred pharmacy:   [facility] [phone] - [place], [place] - [address] AT [facility] [address] [facility] & [address] [place] [address] Phone: [phone] Fax: [phone]  Is this the correct pharmacy for this prescription? Yes If no, delete pharmacy and type the correct one.   Has the prescription been filled recently? Yes  Is the patient out of the medication? No  Has the patient been seen for an appointment in the last year OR does the patient have an upcoming appointment? Yes  Can we respond through MyChart? Yes  Agent: Please be advised that Rx refills may take up to 3 business days. We ask that you follow-up with your pharmacy.

## 2023-08-03 NOTE — Telephone Encounter (Signed)
 FYI Only or Action Required?: Action required by provider: medication refill request.  Patient was last seen in primary care on 07/07/2023 by Sherre Clapper, MD.  Called Nurse Triage reporting Medication Refill.  Symptoms began today.  Interventions attempted: Nothing.  Symptoms are: stable.  Triage Disposition: No disposition on file.  Patient/caregiver understands and will follow disposition?:

## 2023-08-04 ENCOUNTER — Telehealth: Payer: Self-pay | Admitting: Family Medicine

## 2023-08-04 NOTE — Telephone Encounter (Unsigned)
 Copied from CRM [id number]. Topic: Clinical - Medical Advice >> Aug 04, 2023  4:47 PM Tiffini S wrote: Reason for CRM: MJ with [facility] Prior Authorization department [phone] reference number [id number] (will expire July 24th, 11:45 am CST)  have questions about the medication  zolpidem  (AMBIEN  CR) 12.5 MG CR tablet.

## 2023-08-05 NOTE — Telephone Encounter (Signed)
 Called [facility] Prior Authorization department back and spoke with REG about questions for approval of Ambien  12.5 mg tablet. Questions was answer and PA is being process

## 2023-08-06 NOTE — Progress Notes (Signed)
 Subjective:  Patient ID: Donna Mayer, female    DOB: February 02, 1955  Age: 68 y.o. MRN: [id number]  Chief Complaint  Patient presents with   Medical Management of Chronic Issues   Discussed the use of AI scribe software for clinical note transcription with the patient, who gave verbal consent to proceed.  History of Present Illness   Donna Mayer is a 68 year old female with hypothyroidism and hypertension who presents for a follow-up visit.  Thyroid  function and levothyroxine  adjustment - Hypothyroidism managed with levothyroxine , dose decreased from 112 mcg to 100 mcg daily in May - No significant changes in symptoms since dose adjustment - Thyroid  function being rechecked at this visit  Hypertension and antihypertensive medication tolerance - Hypertension previously managed with combination valsartan /hydrochlorothiazide , switched to plain valsartan  due to bloating - No longer experiencing bloating since medication change - Confusion regarding current antihypertensive regimen due to receiving multiple bottles from pharmacy  Hyperlipidemia management - Taking atorvastatin  and recently started Zetia  daily for cholesterol management  Gastroesophageal reflux disease - Taking Nexium  40 mg daily for acid reflux  Constipation - Taking Amitiza  twice daily, which is effective for constipation  Sleep disturbance - Taking Ambien  CR for sleep  Upper respiratory symptoms - No recent infections, nasal drainage, or earaches - Occasional congestion upon waking  Depression and antidepressant tolerance - Previously took Trintellix  for depression, which was effective but caused stomach upset  - Patient feels tired and becomes irritated easily. She is open to alternative medicine.      07/07/2023    3:07 PM 02/05/2023    2:18 PM 12/25/2022    3:33 PM 09/22/2022    2:25 PM 06/13/2022    8:14 AM  Depression screen PHQ 2/9  Decreased Interest 0 0 2 0 2  Down, Depressed, Hopeless 0 0 0 0 2   PHQ - 2 Score 0 0 2 0 4  Altered sleeping 0 0 2  2  Tired, decreased energy 0 0 2  2  Change in appetite 0 2 0  2  Feeling bad or failure about yourself  0 0 0  2  Trouble concentrating 0 0 0  0  Moving slowly or fidgety/restless 0 0 0  0  Suicidal thoughts 0 0 0  0  PHQ-9 Score 0 2 6  12   Difficult doing work/chores Not difficult at all Not difficult at all Not difficult at all  Not difficult at all        08/07/2023    8:12 AM  Fall Risk   Falls in the past year? 0  Number falls in past yr: 0  Injury with Fall? 0  Risk for fall due to : No Fall Risks  Follow up Falls evaluation completed    Patient Care Team: Sherre Clapper, MD as PCP - General (Family Medicine) Hyacinth Warren PARAS, FNP (Endocrinology) Melanee Rosalynn Decent, MD as Referring Physician (Endocrinology) Carlie Clark, MD as Consulting Physician (Otolaryngology) Nyle Rankin POUR, [facility] (Inactive) (Pharmacist)   Review of Systems  Constitutional:  Negative for chills, fatigue and fever.  HENT:  Negative for congestion, ear pain, rhinorrhea and sore throat.   Respiratory:  Negative for cough and shortness of breath.   Cardiovascular:  Negative for chest pain.  Gastrointestinal:  Negative for abdominal pain, constipation, diarrhea, nausea and vomiting.  Genitourinary:  Negative for dysuria and urgency.  Musculoskeletal:  Negative for back pain and myalgias.  Neurological:  Negative for dizziness, weakness, light-headedness and headaches.  Psychiatric/Behavioral:  Negative for dysphoric mood. The patient is not nervous/anxious.     Current Outpatient Medications on File Prior to Visit  Medication Sig Dispense Refill   atorvastatin  (LIPITOR) 80 MG tablet TAKE ONE TABLET BY MOUTH EVERYDAY AT BEDTIME 90 tablet 1   esomeprazole  (NEXIUM ) 40 MG capsule Take 1 capsule (40 mg total) by mouth daily at 12 noon. 90 capsule 3   levothyroxine  (SYNTHROID ) 100 MCG tablet Take 1 tablet (100 mcg total) by mouth daily. 90 tablet 3    lubiprostone  (AMITIZA ) 8 MCG capsule Take 1 capsule (8 mcg total) by mouth 2 (two) times daily with a meal. 180 capsule 3   Polyethyl Glycol-Propyl Glycol (SYSTANE) 0.4-0.3 % SOLN Apply 1 drop to eye 4 (four) times daily as needed. In both eyes. 30 mL 3   valsartan  (DIOVAN ) 160 MG tablet Take 1 tablet (160 mg total) by mouth daily. 90 tablet 0   Vitamin D , Ergocalciferol , (DRISDOL ) 1.25 MG (50000 UNIT) CAPS capsule TAKE ONE CAPSULE BY MOUTH weekly on monday, wednesday, and friday 39 capsule 3   zolpidem  (AMBIEN  CR) 12.5 MG CR tablet TAKE 1 TABLET BY MOUTH AT BEDTIME AS NEEDED 90 tablet 1   No current facility-administered medications on file prior to visit.   Past Medical History:  Diagnosis Date   Abnormal EKG 05/27/2023   Acute CHF (congestive heart failure) (HCC) 11/16/2019   Anemia 11/16/2019   At risk for loss of bone density 05/27/2023   Cardiac murmur    CHF (congestive heart failure) (HCC)    Chronic pain    Chronic pain of left knee 05/27/2023   Chronic systolic heart failure (HCC)    Class 3 severe obesity due to excess calories with serious comorbidity and body mass index (BMI) of 45.0 to 49.9 in adult 06/13/2022   Depression, major, recurrent, mild (HCC) 05/24/2019   Eczema 02/10/2022   Elevated troponin 11/16/2019   Encounter for immunization 12/28/2022   Encounter for mammogram to establish baseline mammogram 02/20/2021   Encounter for osteoporosis screening in asymptomatic postmenopausal patient 12/28/2022   Essential hypertension, benign 05/13/2019   Foot pain, left 09/27/2022   GERD (gastroesophageal reflux disease)    Graves disease 05/24/2019   Graves disease 05/24/2019   Graves' disease with exophthalmos 10/06/2019   Hypertensive urgency 11/16/2019   Hyperthyroidism 05/13/2019   Hyperthyroidism 05/13/2019   IDA (iron deficiency anemia)    Impaired fasting glucose 12/28/2022   Influenza A 02/12/2023   Left thyroid  nodule 10/06/2019   Lumbar back pain  10/09/2021   Mild vitamin D  deficiency 06/13/2022   Mixed hyperlipidemia    Moderate recurrent major depression (HCC) 10/09/2021   Muscle spasm 02/10/2022   Need for influenza vaccination 10/09/2021   Non-intractable vomiting 05/24/2019   Obesity, Class III, BMI 40-49.9 (morbid obesity) 06/06/2020   Other insomnia    Palpitations 05/19/2019   Plantar fasciitis of right foot 10/07/2022   Post-surgical hypothyroidism 01/16/2020   Prediabetes 02/20/2021   Primary insomnia    Severe episode of recurrent major depressive disorder, without psychotic features (HCC) 05/24/2019   Shortness of breath 05/27/2023   Thoracic aortic aneurysm without rupture 08/02/2020   Unexplained weight gain 12/28/2022   Vitamin D  insufficiency 05/27/2023   Weight loss 05/24/2019   Past Surgical History:  Procedure Laterality Date   BACK SURGERY     Dr Duwayne   CARPAL TUNNEL RELEASE Right    CESAREAN SECTION W/BTL     COLONOSCOPY  03/30/2015   Internal hoids.  Mild diverticulosis. Otherwise normal colonocsopy to TI.   ESOPHAGOGASTRODUODENOSCOPY  03/30/2015   Mild gastritis. Small hiatal hernia   Lap band surgery  2010   [place]   SKIN GRAFT     TEE WITHOUT CARDIOVERSION N/A 11/21/2019   Procedure: TRANSESOPHAGEAL ECHOCARDIOGRAM (TEE);  Surgeon: Okey Vina GAILS, MD;  Location: [facility] ENDOSCOPY;  Service: Cardiovascular;  Laterality: N/A;   THYROIDECTOMY N/A 12/07/2019   Procedure: TOTAL THYROIDECTOMY;  Surgeon: Carlie Clark, MD;  Location: [facility] OR;  Service: ENT;  Laterality: N/A;    Family History  Problem Relation Age of Onset   Diabetes Mother    Stroke Mother    Heart attack Father    Diabetes Sister    Sarcoidosis Sister    Colon cancer Neg Hx    Esophageal cancer Neg Hx    Rectal cancer Neg Hx    Stomach cancer Neg Hx    Breast cancer Neg Hx    Social History   Socioeconomic History   Marital status: Divorced    Spouse name: Not on file   Number of children: 2   Years of education: Not on  file   Highest education level: Not on file  Occupational History   Occupation: disabled  Tobacco Use   Smoking status: Never   Smokeless tobacco: Never  Vaping Use   Vaping status: Never Used  Substance and Sexual Activity   Alcohol use: Never   Drug use: Never   Sexual activity: Not Currently  Other Topics Concern   Not on file  Social History Narrative   Not on file   Social Drivers of Health   Financial Resource Strain: Low Risk  (12/25/2022)   Overall Financial Resource Strain (CARDIA)    Difficulty of Paying Living Expenses: Not hard at all  Food Insecurity: No Food Insecurity (02/05/2023)   Hunger Vital Sign    Worried About Running Out of Food in the Last Year: Never true    Ran Out of Food in the Last Year: Never true  Transportation Needs: No Transportation Needs (07/07/2023)   PRAPARE - Administrator, Civil Service (Medical): No    Lack of Transportation (Non-Medical): No  Physical Activity: Insufficiently Active (02/05/2023)   Exercise Vital Sign    Days of Exercise per Week: 7 days    Minutes of Exercise per Session: 20 min  Stress: No Stress Concern Present (12/25/2022)   [employer] of Occupational Health - Occupational Stress Questionnaire    Feeling of Stress : Not at all  Social Connections: Moderately Isolated (12/25/2022)   Social Connection and Isolation Panel    Frequency of Communication with Friends and Family: More than three times a week    Frequency of Social Gatherings with Friends and Family: More than three times a week    Attends Religious Services: More than 4 times per year    Active Member of [employer] or Organizations: No    Attends Banker Meetings: Never    Marital Status: Divorced    Objective:  BP 134/82   Pulse 82   Temp 98.2 F (36.8 C)   Ht 5' 2 (1.575 m)   Wt 244 lb (110.7 kg)   LMP 04/14/2010   SpO2 98%   BMI 44.63 kg/m      08/07/2023    8:09 AM 07/07/2023    3:06 PM 07/02/2023    3:00  PM  BP/Weight  Systolic BP 134 126 146  Diastolic BP 82 78 88  Wt. (Lbs) 244 242 241.4  BMI 44.63 kg/m2 44.26 kg/m2 44.15 kg/m2    Physical Exam Vitals reviewed.  Constitutional:      Appearance: Normal appearance. She is obese.  Neck:     Vascular: No carotid bruit.  Cardiovascular:     Rate and Rhythm: Normal rate and regular rhythm.     Heart sounds: Normal heart sounds.  Pulmonary:     Effort: Pulmonary effort is normal. No respiratory distress.     Breath sounds: Normal breath sounds.  Abdominal:     General: Abdomen is flat. Bowel sounds are normal.     Palpations: Abdomen is soft.     Tenderness: There is no abdominal tenderness.  Lymphadenopathy:     Cervical: Cervical adenopathy (BL shotty anterior cervical LAD.) present.  Neurological:     Mental Status: She is alert and oriented to person, place, and time.  Psychiatric:        Mood and Affect: Mood normal.        Behavior: Behavior normal.         Lab Results  Component Value Date   WBC 4.9 05/27/2023   HGB 10.9 (L) 05/27/2023   HCT 34.6 05/27/2023   PLT 237 05/27/2023   GLUCOSE 85 05/27/2023   CHOL 215 (H) 05/27/2023   TRIG 53 05/27/2023   HDL 84 05/27/2023   LDLCALC 122 (H) 05/27/2023   ALT 10 05/27/2023   AST 17 05/27/2023   NA 138 05/27/2023   K 3.7 05/27/2023   CL 100 05/27/2023   CREATININE 0.73 05/27/2023   BUN 11 05/27/2023   CO2 23 05/27/2023   TSH 2.730 08/07/2023   INR 1.4 (H) 11/16/2019   HGBA1C 5.8 (H) 05/27/2023      Assessment & Plan:  Essential hypertension, benign Assessment & Plan: Well controlled.  No changes to medicines. Valsartan  160 mg daily. Continue to work on eating a healthy diet and exercise.     Mixed hyperlipidemia Assessment & Plan: Well controlled.  No changes to medicines. Lipitor 80 mg daily Continue to work on eating a healthy diet and exercise.     Primary insomnia Assessment & Plan: The current medical regimen is effective;  continue  present plan and medications. Continue ambien  cr 12.5 mg before bed.    Vitamin D  insufficiency Assessment & Plan: The current medical regimen is effective;  continue present plan and medications. Continue vitamin D  three times a week.    Chronic systolic heart failure (HCC) Assessment & Plan: Management per specialist.     Class 3 severe obesity due to excess calories with serious comorbidity and body mass index (BMI) of 40.0 to 44.9 in adult Assessment & Plan: Recommend continue to work on eating healthy diet and exercise.    Prediabetes Assessment & Plan: Recommend continue to work on eating healthy diet and exercise.     Post-surgical hypothyroidism Assessment & Plan: Previously well controlled Continue Synthroid  at current dose  Recheck TSH and adjust Synthroid  as indicated    Orders: -     TSH -     T4, free  Moderate recurrent major depression (HCC) Assessment & Plan: Start on escitalopram  10 mg daily.  Patient did not do phq9, but reports significant depression  Orders: -     Escitalopram  Oxalate; Take 1 tablet (10 mg total) by mouth daily.  Dispense: 30 tablet; Refill: 1  Anterior cervical lymphadenopathy Assessment & Plan: Order ct soft tissue of neck  Orders: -  CT SOFT TISSUE NECK WO CONTRAST; Future     Meds ordered this encounter  Medications   escitalopram  (LEXAPRO ) 10 MG tablet    Sig: Take 1 tablet (10 mg total) by mouth daily.    Dispense:  30 tablet    Refill:  1    Orders Placed This Encounter  Procedures   CT SOFT TISSUE NECK WO CONTRAST   TSH   T4, free     Follow-up: Return in about 8 weeks (around 10/02/2023) for chronic follow up.  I,Marla I Leal-Borjas,acting as a scribe for Abigail Free, MD.,have documented all relevant documentation on the behalf of Abigail Free, MD,as directed by  Abigail Free, MD while in the presence of Abigail Free, MD.    An After Visit Summary was printed and given to the patient.  I attest  that I have reviewed this visit and agree with the plan scribed by my staff.   Abigail Free, MD [facility] [phone]

## 2023-08-07 ENCOUNTER — Encounter: Payer: Self-pay | Admitting: Family Medicine

## 2023-08-07 ENCOUNTER — Ambulatory Visit (INDEPENDENT_AMBULATORY_CARE_PROVIDER_SITE_OTHER): Admitting: Family Medicine

## 2023-08-07 VITALS — BP 134/82 | HR 82 | Temp 98.2°F | Ht 62.0 in | Wt 244.0 lb

## 2023-08-07 DIAGNOSIS — E782 Mixed hyperlipidemia: Secondary | ICD-10-CM | POA: Diagnosis not present

## 2023-08-07 DIAGNOSIS — F331 Major depressive disorder, recurrent, moderate: Secondary | ICD-10-CM

## 2023-08-07 DIAGNOSIS — E890 Postprocedural hypothyroidism: Secondary | ICD-10-CM | POA: Diagnosis not present

## 2023-08-07 DIAGNOSIS — I5022 Chronic systolic (congestive) heart failure: Secondary | ICD-10-CM | POA: Diagnosis not present

## 2023-08-07 DIAGNOSIS — E66813 Obesity, class 3: Secondary | ICD-10-CM

## 2023-08-07 DIAGNOSIS — Z6841 Body Mass Index (BMI) 40.0 and over, adult: Secondary | ICD-10-CM

## 2023-08-07 DIAGNOSIS — R7303 Prediabetes: Secondary | ICD-10-CM | POA: Diagnosis not present

## 2023-08-07 DIAGNOSIS — F5101 Primary insomnia: Secondary | ICD-10-CM

## 2023-08-07 DIAGNOSIS — I10 Essential (primary) hypertension: Secondary | ICD-10-CM | POA: Diagnosis not present

## 2023-08-07 DIAGNOSIS — R590 Localized enlarged lymph nodes: Secondary | ICD-10-CM

## 2023-08-07 DIAGNOSIS — E559 Vitamin D deficiency, unspecified: Secondary | ICD-10-CM | POA: Diagnosis not present

## 2023-08-07 MED ORDER — ESCITALOPRAM OXALATE 10 MG PO TABS: 10.0000 mg | ORAL_TABLET | Freq: Every day | ORAL | 1 refills | Status: DC

## 2023-08-07 NOTE — Patient Instructions (Addendum)
 VISIT SUMMARY:  Today, we reviewed your thyroid  function, hypertension, cholesterol management, and other ongoing health issues. We made some adjustments to your medications and planned follow-ups to monitor your progress.  YOUR PLAN:  THYROID  DISORDER: Your thyroid  function is being managed with levothyroxine , which was recently reduced to 100 mcg daily. -Recheck your thyroid  function today. -Follow up in 6 weeks to re-evaluate your thyroid  function.  HYPERTENSION: You were switched to valsartan  due to bloating from your previous medication. There is some confusion about your current antihypertensive regimen. -Confirm your current antihypertensive medication. -Contact [facility] to cancel all prescriptions to avoid receiving incorrect medications.  HYPERLIPIDEMIA: You are taking atorvastatin  and Zetia  for cholesterol management. -Recheck your cholesterol levels in 6 weeks.  LYMPHADENOPATHY: Small lymph nodes were found in your neck. -Orderin an ultrasound or CT scan of your neck to evaluate the lymph nodes.  -If you do not receive a call to schedule within 2 weeks, please call us .   DEPRESSION: You previously had stomach upset with Trintellix  and are interested in trying escitalopram. -Start escitalopram 10 mg daily. -You will receive a 30-day supply with one refill. -Follow up in 6 weeks to assess your response to the medication.  CONSTIPATION: Your constipation is being effectively managed with Amitiza . -Continue taking Amitiza  as prescribed.  GENERAL HEALTH MAINTENANCE: You are managing multiple chronic conditions and it is advised to streamline your medication management. -Send all prescriptions to [facility] pharmacy.Depression:

## 2023-08-08 LAB — TSH: TSH: 2.73 u[IU]/mL (ref 0.450–4.500)

## 2023-08-08 LAB — T4, FREE: Free T4: 1.61 ng/dL (ref 0.82–1.77)

## 2023-08-09 ENCOUNTER — Ambulatory Visit: Payer: Self-pay | Admitting: Family Medicine

## 2023-08-09 DIAGNOSIS — R590 Localized enlarged lymph nodes: Secondary | ICD-10-CM | POA: Insufficient documentation

## 2023-08-09 NOTE — Assessment & Plan Note (Addendum)
 Management per specialist.

## 2023-08-09 NOTE — Assessment & Plan Note (Signed)
 The current medical regimen is effective;  continue present plan and medications. Continue ambien  cr 12.5 mg before bed.

## 2023-08-09 NOTE — Assessment & Plan Note (Signed)
 Recommend continue to work on eating healthy diet and exercise.

## 2023-08-09 NOTE — Assessment & Plan Note (Signed)
 Well controlled.  No changes to medicines. Valsartan  160 mg daily. Continue to work on eating a healthy diet and exercise.

## 2023-08-09 NOTE — Assessment & Plan Note (Signed)
 The current medical regimen is effective;  continue present plan and medications. Continue vitamin D  three times a week.

## 2023-08-09 NOTE — Assessment & Plan Note (Signed)
 Order ct soft tissue of neck

## 2023-08-09 NOTE — Assessment & Plan Note (Signed)
Well controlled.  No changes to medicines. Lipitor 80 mg daily. Continue to work on eating a healthy diet and exercise.

## 2023-08-09 NOTE — Assessment & Plan Note (Signed)
 Previously well controlled Continue Synthroid at current dose  Recheck TSH and adjust Synthroid as indicated

## 2023-08-09 NOTE — Assessment & Plan Note (Signed)
 Start on escitalopram  10 mg daily.  Patient did not do phq9, but reports significant depression

## 2023-08-13 NOTE — Addendum Note (Signed)
 Addended byBETHA SHERRE CLAPPER on: 08/13/2023 07:05 PM   Modules accepted: Level of Service

## 2023-08-15 DIAGNOSIS — M79652 Pain in left thigh: Secondary | ICD-10-CM | POA: Diagnosis not present

## 2023-08-15 DIAGNOSIS — S79922A Unspecified injury of left thigh, initial encounter: Secondary | ICD-10-CM | POA: Diagnosis not present

## 2023-08-18 ENCOUNTER — Ambulatory Visit (INDEPENDENT_AMBULATORY_CARE_PROVIDER_SITE_OTHER): Admitting: Family Medicine

## 2023-08-18 ENCOUNTER — Encounter: Payer: Self-pay | Admitting: Family Medicine

## 2023-08-18 VITALS — BP 132/78 | HR 62 | Temp 98.2°F | Ht 62.0 in | Wt 246.0 lb

## 2023-08-18 DIAGNOSIS — M79605 Pain in left leg: Secondary | ICD-10-CM | POA: Diagnosis not present

## 2023-08-18 NOTE — Progress Notes (Signed)
 Acute Office Visit  Subjective:    Patient ID: CARAL WHAN, female    DOB: 06-Oct-1955, 68 y.o.   MRN: [id number]  Chief Complaint  Patient presents with   Leg Pain    HPI: Patient is in today for left leg pain that started Saturday on the anterior upper thigh and has now moved to posterior left leg. Seen UC who rx'd ketoralac, suggested a heating pad and rest which has helped some. Pain is behind her knee but with walking. When sitting no pain. Hurts with walking. No injury. Only taking toradol once daily. Written four times a day.   Past Medical History:  Diagnosis Date   Abnormal EKG 05/27/2023   Acute CHF (congestive heart failure) (HCC) 11/16/2019   Anemia 11/16/2019   At risk for loss of bone density 05/27/2023   Cardiac murmur    CHF (congestive heart failure) (HCC)    Chronic pain    Chronic pain of left knee 05/27/2023   Chronic systolic heart failure (HCC)    Class 3 severe obesity due to excess calories with serious comorbidity and body mass index (BMI) of 45.0 to 49.9 in adult 06/13/2022   Depression, major, recurrent, mild (HCC) 05/24/2019   Eczema 02/10/2022   Elevated troponin 11/16/2019   Encounter for immunization 12/28/2022   Encounter for mammogram to establish baseline mammogram 02/20/2021   Encounter for osteoporosis screening in asymptomatic postmenopausal patient 12/28/2022   Essential hypertension, benign 05/13/2019   Foot pain, left 09/27/2022   GERD (gastroesophageal reflux disease)    Graves disease 05/24/2019   Graves disease 05/24/2019   Graves' disease with exophthalmos 10/06/2019   Hypertensive urgency 11/16/2019   Hyperthyroidism 05/13/2019   Hyperthyroidism 05/13/2019   IDA (iron deficiency anemia)    Impaired fasting glucose 12/28/2022   Influenza A 02/12/2023   Left thyroid  nodule 10/06/2019   Lumbar back pain 10/09/2021   Mild vitamin D  deficiency 06/13/2022   Mixed hyperlipidemia    Moderate recurrent major depression (HCC)  10/09/2021   Muscle spasm 02/10/2022   Need for influenza vaccination 10/09/2021   Non-intractable vomiting 05/24/2019   Obesity, Class III, BMI 40-49.9 (morbid obesity) 06/06/2020   Other insomnia    Palpitations 05/19/2019   Plantar fasciitis of right foot 10/07/2022   Post-surgical hypothyroidism 01/16/2020   Prediabetes 02/20/2021   Primary insomnia    Severe episode of recurrent major depressive disorder, without psychotic features (HCC) 05/24/2019   Shortness of breath 05/27/2023   Thoracic aortic aneurysm without rupture 08/02/2020   Unexplained weight gain 12/28/2022   Vitamin D  insufficiency 05/27/2023   Weight loss 05/24/2019    Past Surgical History:  Procedure Laterality Date   BACK SURGERY     Dr Duwayne   CARPAL TUNNEL RELEASE Right    CESAREAN SECTION W/BTL     COLONOSCOPY  03/30/2015   Internal hoids. Mild diverticulosis. Otherwise normal colonocsopy to TI.   ESOPHAGOGASTRODUODENOSCOPY  03/30/2015   Mild gastritis. Small hiatal hernia   Lap band surgery  2010   [place]   SKIN GRAFT     TEE WITHOUT CARDIOVERSION N/A 11/21/2019   Procedure: TRANSESOPHAGEAL ECHOCARDIOGRAM (TEE);  Surgeon: Okey Vina LULLA, MD;  Location: [facility] ENDOSCOPY;  Service: Cardiovascular;  Laterality: N/A;   THYROIDECTOMY N/A 12/07/2019   Procedure: TOTAL THYROIDECTOMY;  Surgeon: Carlie Clark, MD;  Location: [facility] OR;  Service: ENT;  Laterality: N/A;    Family History  Problem Relation Age of Onset   Diabetes Mother    Stroke  Mother    Heart attack Father    Diabetes Sister    Sarcoidosis Sister    Colon cancer Neg Hx    Esophageal cancer Neg Hx    Rectal cancer Neg Hx    Stomach cancer Neg Hx    Breast cancer Neg Hx     Social History   Socioeconomic History   Marital status: Divorced    Spouse name: Not on file   Number of children: 2   Years of education: Not on file   Highest education level: Not on file  Occupational History   Occupation: disabled  Tobacco Use   Smoking  status: Never   Smokeless tobacco: Never  Vaping Use   Vaping status: Never Used  Substance and Sexual Activity   Alcohol use: Never   Drug use: Never   Sexual activity: Not Currently  Other Topics Concern   Not on file  Social History Narrative   Not on file   Social Drivers of Health   Financial Resource Strain: Low Risk  (12/25/2022)   Overall Financial Resource Strain (CARDIA)    Difficulty of Paying Living Expenses: Not hard at all  Food Insecurity: No Food Insecurity (02/05/2023)   Hunger Vital Sign    Worried About Running Out of Food in the Last Year: Never true    Ran Out of Food in the Last Year: Never true  Transportation Needs: No Transportation Needs (07/07/2023)   PRAPARE - Administrator, Civil Service (Medical): No    Lack of Transportation (Non-Medical): No  Physical Activity: Insufficiently Active (02/05/2023)   Exercise Vital Sign    Days of Exercise per Week: 7 days    Minutes of Exercise per Session: 20 min  Stress: No Stress Concern Present (12/25/2022)   [employer] of Occupational Health - Occupational Stress Questionnaire    Feeling of Stress : Not at all  Social Connections: Moderately Isolated (12/25/2022)   Social Connection and Isolation Panel    Frequency of Communication with Friends and Family: More than three times a week    Frequency of Social Gatherings with Friends and Family: More than three times a week    Attends Religious Services: More than 4 times per year    Active Member of [employer] or Organizations: No    Attends Banker Meetings: Never    Marital Status: Divorced  Catering manager Violence: Not At Risk (07/07/2023)   Humiliation, Afraid, Rape, and Kick questionnaire    Fear of Current or Ex-Partner: No    Emotionally Abused: No    Physically Abused: No    Sexually Abused: No    Outpatient Medications Prior to Visit  Medication Sig Dispense Refill   atorvastatin  (LIPITOR) 80 MG tablet TAKE ONE  TABLET BY MOUTH EVERYDAY AT BEDTIME 90 tablet 1   escitalopram  (LEXAPRO ) 10 MG tablet Take 1 tablet (10 mg total) by mouth daily. 30 tablet 1   esomeprazole  (NEXIUM ) 40 MG capsule Take 1 capsule (40 mg total) by mouth daily at 12 noon. 90 capsule 3   levothyroxine  (SYNTHROID ) 100 MCG tablet Take 1 tablet (100 mcg total) by mouth daily. 90 tablet 3   lubiprostone  (AMITIZA ) 8 MCG capsule Take 1 capsule (8 mcg total) by mouth 2 (two) times daily with a meal. 180 capsule 3   Polyethyl Glycol-Propyl Glycol (SYSTANE) 0.4-0.3 % SOLN Apply 1 drop to eye 4 (four) times daily as needed. In both eyes. 30 mL 3   valsartan  (DIOVAN )  160 MG tablet Take 1 tablet (160 mg total) by mouth daily. 90 tablet 0   Vitamin D , Ergocalciferol , (DRISDOL ) 1.25 MG (50000 UNIT) CAPS capsule TAKE ONE CAPSULE BY MOUTH weekly on monday, wednesday, and friday 39 capsule 3   zolpidem  (AMBIEN  CR) 12.5 MG CR tablet TAKE 1 TABLET BY MOUTH AT BEDTIME AS NEEDED 90 tablet 1   No facility-administered medications prior to visit.    No Known Allergies  Review of Systems  Constitutional:  Negative for chills, fatigue and fever.  HENT:  Negative for congestion, ear pain, rhinorrhea and sore throat.   Respiratory:  Negative for cough and shortness of breath.   Cardiovascular:  Negative for chest pain.  Gastrointestinal:  Negative for abdominal pain, constipation, diarrhea, nausea and vomiting.  Genitourinary:  Negative for dysuria and urgency.  Musculoskeletal:  Negative for back pain and myalgias.  Neurological:  Negative for dizziness, weakness, light-headedness and headaches.  Psychiatric/Behavioral:  Negative for dysphoric mood. The patient is not nervous/anxious.        Objective:        08/18/2023    1:25 PM 08/07/2023    8:09 AM 07/07/2023    3:06 PM  Vitals with BMI  Height 5' 2 5' 2 5' 2  Weight 246 lbs 244 lbs 242 lbs  BMI 44.98 44.62 44.25  Systolic 132 134 873  Diastolic 78 82 78  Pulse 62 82 69    No data  found.   Physical Exam Vitals reviewed.  Constitutional:      Appearance: Normal appearance.  Musculoskeletal:     Comments: Non tender to palpation.  Negative SLR.  Discomfort with ambulation.   Skin:    Coloration: Skin is jaundiced.  Neurological:     Mental Status: She is alert.     Health Maintenance Due  Topic Date Due   Hepatitis C Screening  Never done   DTaP/Tdap/Td (1 - Tdap) Never done   Cervical Cancer Screening (HPV/Pap Cotest)  Never done   Zoster Vaccines- Shingrix (1 of 2) Never done   DEXA SCAN  Never done    There are no preventive care reminders to display for this patient.   Lab Results  Component Value Date   TSH 2.730 08/07/2023   Lab Results  Component Value Date   WBC 4.9 05/27/2023   HGB 10.9 (L) 05/27/2023   HCT 34.6 05/27/2023   MCV 82 05/27/2023   PLT 237 05/27/2023   Lab Results  Component Value Date   NA 138 05/27/2023   K 3.7 05/27/2023   CO2 23 05/27/2023   GLUCOSE 85 05/27/2023   BUN 11 05/27/2023   CREATININE 0.73 05/27/2023   BILITOT 0.6 05/27/2023   ALKPHOS 92 05/27/2023   AST 17 05/27/2023   ALT 10 05/27/2023   PROT 7.2 05/27/2023   ALBUMIN 4.3 05/27/2023   CALCIUM  9.2 05/27/2023   ANIONGAP 9 11/21/2019   EGFR 91 05/27/2023   Lab Results  Component Value Date   CHOL 215 (H) 05/27/2023   Lab Results  Component Value Date   HDL 84 05/27/2023   Lab Results  Component Value Date   LDLCALC 122 (H) 05/27/2023   Lab Results  Component Value Date   TRIG 53 05/27/2023   Lab Results  Component Value Date   CHOLHDL 2.6 05/27/2023   Lab Results  Component Value Date   HGBA1C 5.8 (H) 05/27/2023       Assessment & Plan:  Left leg pain Assessment &  Plan: Exam is not consistent with sciatica.  More concerning for a muscle strain. Recommend continue heat.  May use ice. Recommend Toradol 4 times a day for the next 2 to 3 days until the prescription is gone that she received from the urgent care.  At this  point if it is not improved I would recommend ibuprofen. Recommended stretching exercises. If not improving please let us  know.      No orders of the defined types were placed in this encounter.   No orders of the defined types were placed in this encounter.    Follow-up: No follow-ups on file.  An After Visit Summary was printed and given to the patient.  Abigail Free, MD [facility] [phone]

## 2023-08-18 NOTE — Patient Instructions (Signed)
 Recommend complete the Ketoralac four times a day until gone.  Continue stretching exercises

## 2023-08-18 NOTE — Assessment & Plan Note (Signed)
 Exam is not consistent with sciatica.  More concerning for a muscle strain. Recommend continue heat.  May use ice. Recommend Toradol 4 times a day for the next 2 to 3 days until the prescription is gone that she received from the urgent care.  At this point if it is not improved I would recommend ibuprofen. Recommended stretching exercises. If not improving please let us  know.

## 2023-08-20 ENCOUNTER — Other Ambulatory Visit: Payer: Self-pay

## 2023-08-31 ENCOUNTER — Other Ambulatory Visit: Payer: Self-pay | Admitting: Family Medicine

## 2023-08-31 DIAGNOSIS — F5101 Primary insomnia: Secondary | ICD-10-CM

## 2023-09-15 ENCOUNTER — Ambulatory Visit (INDEPENDENT_AMBULATORY_CARE_PROVIDER_SITE_OTHER)
Admission: RE | Admit: 2023-09-15 | Discharge: 2023-09-15 | Disposition: A | Discharge status: Home / Self Care | Source: Ambulatory Visit | Attending: Family Medicine | Admitting: Family Medicine

## 2023-09-15 DIAGNOSIS — R590 Localized enlarged lymph nodes: Secondary | ICD-10-CM

## 2023-09-17 NOTE — Progress Notes (Signed)
   09/17/2023  Patient ID: Donna Mayer, female   DOB: 13-Jan-1956, 68 y.o.   MRN: [id number]  Pharmacy Quality Measure Review  This patient is appearing on a report for being at risk of failing the adherence measure for cholesterol (statin) and hypertension (ACEi/ARB) medications this calendar year.   Medication:  -Valsartan /hydrochlorothiazide  changed to regular valsartan  last filled 07/28/23 90 ds -atorvastatin  last filled 09/08/23 and 07/07/23 100 DS and 90 DS   Insurance report was not up to date. No action needed at this time.   Lang Sieve, PharmD, BCGP Clinical Pharmacist  [phone]

## 2023-09-27 ENCOUNTER — Encounter: Payer: Self-pay | Admitting: Family Medicine

## 2023-09-28 ENCOUNTER — Other Ambulatory Visit: Payer: Self-pay | Admitting: Family Medicine

## 2023-09-28 DIAGNOSIS — I10 Essential (primary) hypertension: Secondary | ICD-10-CM

## 2023-09-30 ENCOUNTER — Telehealth: Payer: Self-pay | Admitting: Family Medicine

## 2023-09-30 NOTE — Telephone Encounter (Signed)
 Relay to office.  09/28/2023 And 09/30/2023 LMTC WITH DR CARLIE, ENT. NURSE.  09/30/2023: [facility] WITH ENDOCRINOLOGY  Patient has an abnormal ct scan of neck showing possible remaining thyroid  tissue 3 years out from thyroidectomy.  Awaiting call to determine course of action.  Dr. Sherre

## 2023-09-30 NOTE — Progress Notes (Signed)
 Note Relay to office.  09/28/2023 And 09/30/2023 LMTC WITH DR CARLIE, ENT. NURSE.  09/30/2023: [facility] WITH ENDOCRINOLOGY  Patient has an abnormal ct scan of neck showing possible remaining thyroid  tissue 3 years out from thyroidectomy.  Awaiting call to determine course of action.  Dr. Sherre

## 2023-10-04 NOTE — Progress Notes (Signed)
 Subjective:  Patient ID: Donna Mayer, female    DOB: 10-Aug-1955  Age: 68 y.o. MRN: [id number]  No chief complaint on file.   HPI: Discussed the use of AI scribe software for clinical note transcription with the patient, who gave verbal consent to proceed.  History of Present Illness        07/07/2023    3:07 PM 02/05/2023    2:18 PM 12/25/2022    3:33 PM 09/22/2022    2:25 PM 06/13/2022    8:14 AM  Depression screen PHQ 2/9  Decreased Interest 0 0 2 0 2  Down, Depressed, Hopeless 0 0 0 0 2  PHQ - 2 Score 0 0 2 0 4  Altered sleeping 0 0 2  2  Tired, decreased energy 0 0 2  2  Change in appetite 0 2 0  2  Feeling bad or failure about yourself  0 0 0  2  Trouble concentrating 0 0 0  0  Moving slowly or fidgety/restless 0 0 0  0  Suicidal thoughts 0 0 0  0  PHQ-9 Score 0 2 6  12   Difficult doing work/chores Not difficult at all Not difficult at all Not difficult at all  Not difficult at all        08/07/2023    8:12 AM  Fall Risk   Falls in the past year? 0  Number falls in past yr: 0  Injury with Fall? 0  Risk for fall due to : No Fall Risks  Follow up Falls evaluation completed    Patient Care Team: Sherre Clapper, MD as PCP - General (Family Medicine) Hyacinth Warren PARAS, FNP (Endocrinology) Melanee Rosalynn Decent, MD as Referring Physician (Endocrinology) Carlie Clark, MD as Consulting Physician (Otolaryngology) Nyle Rankin POUR, [facility] (Inactive) (Pharmacist)   Review of Systems  Constitutional:  Negative for chills, fatigue and fever.  HENT:  Negative for congestion, ear pain and sore throat.   Respiratory:  Negative for cough and shortness of breath.   Cardiovascular:  Negative for chest pain.  Gastrointestinal:  Negative for abdominal pain, constipation, diarrhea, nausea and vomiting.  Genitourinary:  Negative for dysuria and urgency.  Musculoskeletal:  Negative for arthralgias and myalgias.  Skin:  Negative for rash.  Neurological:  Negative for dizziness and  headaches.  Psychiatric/Behavioral:  Negative for dysphoric mood. The patient is not nervous/anxious.     Current Outpatient Medications on File Prior to Visit  Medication Sig Dispense Refill   atorvastatin  (LIPITOR) 80 MG tablet TAKE ONE TABLET BY MOUTH EVERYDAY AT BEDTIME 90 tablet 1   escitalopram  (LEXAPRO ) 10 MG tablet Take 1 tablet (10 mg total) by mouth daily. 30 tablet 1   esomeprazole  (NEXIUM ) 40 MG capsule Take 1 capsule (40 mg total) by mouth daily at 12 noon. 90 capsule 3   levothyroxine  (SYNTHROID ) 100 MCG tablet Take 1 tablet (100 mcg total) by mouth daily. 90 tablet 3   lubiprostone  (AMITIZA ) 8 MCG capsule Take 1 capsule (8 mcg total) by mouth 2 (two) times daily with a meal. 180 capsule 3   Polyethyl Glycol-Propyl Glycol (SYSTANE) 0.4-0.3 % SOLN Apply 1 drop to eye 4 (four) times daily as needed. In both eyes. 30 mL 3   valsartan  (DIOVAN ) 160 MG tablet TAKE 1 TABLET BY MOUTH DAILY 90 tablet 3   Vitamin D , Ergocalciferol , (DRISDOL ) 1.25 MG (50000 UNIT) CAPS capsule TAKE ONE CAPSULE BY MOUTH weekly on monday, wednesday, and friday 39 capsule 3   zolpidem  (  AMBIEN  CR) 12.5 MG CR tablet TAKE 1 TABLET BY MOUTH AT BEDTIME AS NEEDED 30 tablet 5   No current facility-administered medications on file prior to visit.   Past Medical History:  Diagnosis Date   Abnormal EKG 05/27/2023   Acute CHF (congestive heart failure) (HCC) 11/16/2019   Anemia 11/16/2019   At risk for loss of bone density 05/27/2023   Cardiac murmur    CHF (congestive heart failure) (HCC)    Chronic pain    Chronic pain of left knee 05/27/2023   Chronic systolic heart failure (HCC)    Class 3 severe obesity due to excess calories with serious comorbidity and body mass index (BMI) of 45.0 to 49.9 in adult 06/13/2022   Depression, major, recurrent, mild (HCC) 05/24/2019   Eczema 02/10/2022   Elevated troponin 11/16/2019   Encounter for immunization 12/28/2022   Encounter for mammogram to establish baseline  mammogram 02/20/2021   Encounter for osteoporosis screening in asymptomatic postmenopausal patient 12/28/2022   Essential hypertension, benign 05/13/2019   Foot pain, left 09/27/2022   GERD (gastroesophageal reflux disease)    Graves disease 05/24/2019   Graves disease 05/24/2019   Graves' disease with exophthalmos 10/06/2019   Hypertensive urgency 11/16/2019   Hyperthyroidism 05/13/2019   Hyperthyroidism 05/13/2019   IDA (iron deficiency anemia)    Impaired fasting glucose 12/28/2022   Influenza A 02/12/2023   Left thyroid  nodule 10/06/2019   Lumbar back pain 10/09/2021   Mild vitamin D  deficiency 06/13/2022   Mixed hyperlipidemia    Moderate recurrent major depression (HCC) 10/09/2021   Muscle spasm 02/10/2022   Need for influenza vaccination 10/09/2021   Non-intractable vomiting 05/24/2019   Obesity, Class III, BMI 40-49.9 (morbid obesity) 06/06/2020   Other insomnia    Palpitations 05/19/2019   Plantar fasciitis of right foot 10/07/2022   Post-surgical hypothyroidism 01/16/2020   Prediabetes 02/20/2021   Primary insomnia    Severe episode of recurrent major depressive disorder, without psychotic features (HCC) 05/24/2019   Shortness of breath 05/27/2023   Thoracic aortic aneurysm without rupture 08/02/2020   Unexplained weight gain 12/28/2022   Vitamin D  insufficiency 05/27/2023   Weight loss 05/24/2019   Past Surgical History:  Procedure Laterality Date   BACK SURGERY     Dr Duwayne   CARPAL TUNNEL RELEASE Right    CESAREAN SECTION W/BTL     COLONOSCOPY  03/30/2015   Internal hoids. Mild diverticulosis. Otherwise normal colonocsopy to TI.   ESOPHAGOGASTRODUODENOSCOPY  03/30/2015   Mild gastritis. Small hiatal hernia   Lap band surgery  2010   [place]   SKIN GRAFT     TEE WITHOUT CARDIOVERSION N/A 11/21/2019   Procedure: TRANSESOPHAGEAL ECHOCARDIOGRAM (TEE);  Surgeon: Okey Vina GAILS, MD;  Location: [facility] ENDOSCOPY;  Service: Cardiovascular;  Laterality: N/A;    THYROIDECTOMY N/A 12/07/2019   Procedure: TOTAL THYROIDECTOMY;  Surgeon: Carlie Clark, MD;  Location: [facility] OR;  Service: ENT;  Laterality: N/A;    Family History  Problem Relation Age of Onset   Diabetes Mother    Stroke Mother    Heart attack Father    Diabetes Sister    Sarcoidosis Sister    Colon cancer Neg Hx    Esophageal cancer Neg Hx    Rectal cancer Neg Hx    Stomach cancer Neg Hx    Breast cancer Neg Hx    Social History   Socioeconomic History   Marital status: Divorced    Spouse name: Not on file   Number of  children: 2   Years of education: Not on file   Highest education level: Not on file  Occupational History   Occupation: disabled  Tobacco Use   Smoking status: Never   Smokeless tobacco: Never  Vaping Use   Vaping status: Never Used  Substance and Sexual Activity   Alcohol use: Never   Drug use: Never   Sexual activity: Not Currently  Other Topics Concern   Not on file  Social History Narrative   Not on file   Social Drivers of Health   Financial Resource Strain: Low Risk  (12/25/2022)   Overall Financial Resource Strain (CARDIA)    Difficulty of Paying Living Expenses: Not hard at all  Food Insecurity: No Food Insecurity (02/05/2023)   Hunger Vital Sign    Worried About Running Out of Food in the Last Year: Never true    Ran Out of Food in the Last Year: Never true  Transportation Needs: No Transportation Needs (07/07/2023)   PRAPARE - Administrator, Civil Service (Medical): No    Lack of Transportation (Non-Medical): No  Physical Activity: Insufficiently Active (02/05/2023)   Exercise Vital Sign    Days of Exercise per Week: 7 days    Minutes of Exercise per Session: 20 min  Stress: No Stress Concern Present (12/25/2022)   [employer] of Occupational Health - Occupational Stress Questionnaire    Feeling of Stress : Not at all  Social Connections: Moderately Isolated (12/25/2022)   Social Connection and Isolation Panel     Frequency of Communication with Friends and Family: More than three times a week    Frequency of Social Gatherings with Friends and Family: More than three times a week    Attends Religious Services: More than 4 times per year    Active Member of Clubs or Organizations: No    Attends Banker Meetings: Never    Marital Status: Divorced    Objective:  LMP 04/14/2010      08/18/2023    1:25 PM 08/07/2023    8:09 AM 07/07/2023    3:06 PM  BP/Weight  Systolic BP 132 134 126  Diastolic BP 78 82 78  Wt. (Lbs) 246 244 242  BMI 44.99 kg/m2 44.63 kg/m2 44.26 kg/m2    Physical Exam Vitals reviewed.  Constitutional:      Appearance: Normal appearance. She is obese.  Neck:     Vascular: No carotid bruit.  Cardiovascular:     Rate and Rhythm: Normal rate and regular rhythm.     Heart sounds: Normal heart sounds.  Pulmonary:     Effort: Pulmonary effort is normal. No respiratory distress.     Breath sounds: Normal breath sounds.  Abdominal:     General: Abdomen is flat. Bowel sounds are normal.     Palpations: Abdomen is soft.     Tenderness: There is no abdominal tenderness.  Neurological:     Mental Status: She is alert and oriented to person, place, and time.  Psychiatric:        Mood and Affect: Mood normal.        Behavior: Behavior normal.     {Perform Simple Foot Exam  Perform Detailed exam:1} {Insert foot Exam (Optional):30965}   Lab Results  Component Value Date   WBC 4.9 05/27/2023   HGB 10.9 (L) 05/27/2023   HCT 34.6 05/27/2023   PLT 237 05/27/2023   GLUCOSE 85 05/27/2023   CHOL 215 (H) 05/27/2023   TRIG 53 05/27/2023  HDL 84 05/27/2023   LDLCALC 122 (H) 05/27/2023   ALT 10 05/27/2023   AST 17 05/27/2023   NA 138 05/27/2023   K 3.7 05/27/2023   CL 100 05/27/2023   CREATININE 0.73 05/27/2023   BUN 11 05/27/2023   CO2 23 05/27/2023   TSH 2.730 08/07/2023   INR 1.4 (H) 11/16/2019   HGBA1C 5.8 (H) 05/27/2023      Assessment & Plan:   Essential hypertension, benign  Prediabetes  Mixed hyperlipidemia     There is no height or weight on file to calculate BMI.  Assessment and Plan Assessment & Plan      No orders of the defined types were placed in this encounter.   No orders of the defined types were placed in this encounter.      Follow-up: No follow-ups on file.  An After Visit Summary was printed and given to the patient.  Abigail Free, MD [facility] [phone]

## 2023-10-05 ENCOUNTER — Encounter: Payer: Self-pay | Admitting: Family Medicine

## 2023-10-05 ENCOUNTER — Ambulatory Visit (INDEPENDENT_AMBULATORY_CARE_PROVIDER_SITE_OTHER): Admitting: Family Medicine

## 2023-10-05 VITALS — BP 130/88 | HR 64 | Temp 98.2°F | Ht 62.0 in | Wt 250.0 lb

## 2023-10-05 DIAGNOSIS — R7303 Prediabetes: Secondary | ICD-10-CM | POA: Diagnosis not present

## 2023-10-05 DIAGNOSIS — E782 Mixed hyperlipidemia: Secondary | ICD-10-CM | POA: Diagnosis not present

## 2023-10-05 DIAGNOSIS — E890 Postprocedural hypothyroidism: Secondary | ICD-10-CM | POA: Diagnosis not present

## 2023-10-05 DIAGNOSIS — E66813 Obesity, class 3: Secondary | ICD-10-CM | POA: Diagnosis not present

## 2023-10-05 DIAGNOSIS — Z6841 Body Mass Index (BMI) 40.0 and over, adult: Secondary | ICD-10-CM

## 2023-10-05 DIAGNOSIS — Q892 Congenital malformations of other endocrine glands: Secondary | ICD-10-CM | POA: Insufficient documentation

## 2023-10-05 DIAGNOSIS — I10 Essential (primary) hypertension: Secondary | ICD-10-CM

## 2023-10-05 DIAGNOSIS — Z23 Encounter for immunization: Secondary | ICD-10-CM | POA: Diagnosis not present

## 2023-10-05 HISTORY — DX: Congenital malformations of other endocrine glands: Q89.2

## 2023-10-05 LAB — POCT LIPID PANEL
HDL: 93
LDL: 96
Non-HDL: 112
TC: 205
TRG: 80

## 2023-10-05 LAB — POCT GLYCOSYLATED HEMOGLOBIN (HGB A1C): HbA1c POC (<> result, manual entry): 5.4 % (ref 4.0–5.6)

## 2023-10-05 NOTE — Progress Notes (Deleted)
 Cardiology Office Note:    Date:  10/05/2023   ID:  Donna Mayer, DOB 1955-08-04, MRN [id number]  PCP:  Sherre Clapper, MD  Cardiologist:  Redell Leiter, MD    Referring MD: Sherre Clapper, MD    ASSESSMENT:    No diagnosis found. PLAN:    In order of problems listed above:  ***   Next appointment: ***   Medication Adjustments/Labs and Tests Ordered: Current medicines are reviewed at length with the patient today.  Concerns regarding medicines are outlined above.  No orders of the defined types were placed in this encounter.  No orders of the defined types were placed in this encounter.    History of Present Illness:    Donna Mayer is a 68 y.o. female with a hx of heart failure, hypertensive heart disease and thyroid  disease last seen by cardiology inpatient 11/18/2019 during [facility] admission for heart failure.  Other findings include mildly dilated aorta with moderate aortic regurgitation and troponin was mildly elevated due to heart failure. She was last seen 12/19/2019. Compliance with diet, lifestyle and medications: *** Past Medical History:  Diagnosis Date   Abnormal EKG 05/27/2023   Acute CHF (congestive heart failure) (HCC) 11/16/2019   Anemia 11/16/2019   At risk for loss of bone density 05/27/2023   Cardiac murmur    CHF (congestive heart failure) (HCC)    Chronic pain    Chronic pain of left knee 05/27/2023   Chronic systolic heart failure (HCC)    Class 3 severe obesity due to excess calories with serious comorbidity and body mass index (BMI) of 45.0 to 49.9 in adult 06/13/2022   Depression, major, recurrent, mild 05/24/2019   Eczema 02/10/2022   Elevated troponin 11/16/2019   Encounter for immunization 12/28/2022   Encounter for mammogram to establish baseline mammogram 02/20/2021   Encounter for osteoporosis screening in asymptomatic postmenopausal patient 12/28/2022   Essential hypertension, benign 05/13/2019   Foot pain, left  09/27/2022   GERD (gastroesophageal reflux disease)    Graves disease 05/24/2019   Graves disease 05/24/2019   Graves' disease with exophthalmos 10/06/2019   Hypertensive urgency 11/16/2019   Hyperthyroidism 05/13/2019   Hyperthyroidism 05/13/2019   IDA (iron deficiency anemia)    Impaired fasting glucose 12/28/2022   Influenza A 02/12/2023   Left thyroid  nodule 10/06/2019   Lumbar back pain 10/09/2021   Mild vitamin D  deficiency 06/13/2022   Mixed hyperlipidemia    Moderate recurrent major depression (HCC) 10/09/2021   Muscle spasm 02/10/2022   Need for influenza vaccination 10/09/2021   Non-intractable vomiting 05/24/2019   Obesity, Class III, BMI 40-49.9 (morbid obesity) 06/06/2020   Other insomnia    Palpitations 05/19/2019   Plantar fasciitis of right foot 10/07/2022   Post-surgical hypothyroidism 01/16/2020   Prediabetes 02/20/2021   Primary insomnia    Severe episode of recurrent major depressive disorder, without psychotic features (HCC) 05/24/2019   Shortness of breath 05/27/2023   Thoracic aortic aneurysm without rupture 08/02/2020   Unexplained weight gain 12/28/2022   Vitamin D  insufficiency 05/27/2023   Weight loss 05/24/2019    Current Medications: No outpatient medications have been marked as taking for the 10/07/23 encounter (Appointment) with Leiter Redell PARAS, MD.      EKGs/Labs/Other Studies Reviewed:    The following studies were reviewed today:  Cardiac Studies & Procedures   ______________________________________________________________________________________________     ECHOCARDIOGRAM  ECHOCARDIOGRAM COMPLETE 07/31/2023  Narrative ECHOCARDIOGRAM REPORT    Patient Name:   Donna Mayer  Fleek Date of Exam: 07/31/2023 Medical Rec #:  [id number]      Height:       62.0 in Accession #:    [id number]     Weight:       242.0 lb Date of Birth:  [birth year]      BSA:          2.073 m Patient Age:    67 years       BP:           146/88 mmHg Patient  Gender: F              HR:           64 bpm. Exam Location:  [place]  Procedure: 2D Echo, Cardiac Doppler, Color Doppler and Strain Analysis (Both Spectral and Color Flow Doppler were utilized during procedure).  Indications:    Abnormal EKG [R94.31 (ICD-10-CM)]; Essential hypertension, benign [I10 (ICD-10-CM)]; LV dysfunction [I51.9 (ICD-10-CM)]; Nonrheumatic mitral valve regurgitation [I34.0 (ICD-10-CM)]  History:        Patient has prior history of Echocardiogram examinations, most recent 11/18/2019. Abnormal ECG; Risk Factors:Hypertension.  Sonographer:    Charlie Jointer RDCS Referring Phys: [id number] ALEAN SAUNDERS MADIREDDY   Sonographer Comments: Suboptimal subcostal window. Image acquisition challenging due to patient body habitus. IMPRESSIONS   1. Left ventricular ejection fraction, by estimation, is 60 to 65%. The left ventricle has normal function. The left ventricle has no regional wall motion abnormalities. There is mild left ventricular hypertrophy. Left ventricular diastolic parameters are consistent with Grade I diastolic dysfunction (impaired relaxation). The average left ventricular global longitudinal strain is -14.0 %. The global longitudinal strain is abnormal. 2. Right ventricular systolic function is normal. The right ventricular size is normal. There is normal pulmonary artery systolic pressure. 3. The mitral valve is normal in structure. Mild mitral valve regurgitation. No evidence of mitral stenosis. 4. The aortic valve is normal in structure. Aortic valve regurgitation is mild. No aortic stenosis is present. 5. The inferior vena cava is normal in size with greater than 50% respiratory variability, suggesting right atrial pressure of 3 mmHg.  FINDINGS Left Ventricle: Left ventricular ejection fraction, by estimation, is 60 to 65%. The left ventricle has normal function. The left ventricle has no regional wall motion abnormalities. The average left ventricular global  longitudinal strain is -14.0 %. Strain was performed and the global longitudinal strain is abnormal. The left ventricular internal cavity size was normal in size. There is mild left ventricular hypertrophy. Left ventricular diastolic parameters are consistent with Grade I diastolic dysfunction (impaired relaxation).  Right Ventricle: The right ventricular size is normal. No increase in right ventricular wall thickness. Right ventricular systolic function is normal. There is normal pulmonary artery systolic pressure. The tricuspid regurgitant velocity is 2.84 m/s, and with an assumed right atrial pressure of 3 mmHg, the estimated right ventricular systolic pressure is 35.3 mmHg.  Left Atrium: Left atrial size was normal in size.  Right Atrium: Right atrial size was normal in size.  Pericardium: There is no evidence of pericardial effusion.  Mitral Valve: The mitral valve is normal in structure. Mild mitral valve regurgitation. No evidence of mitral valve stenosis.  Tricuspid Valve: The tricuspid valve is normal in structure. Tricuspid valve regurgitation is not demonstrated. No evidence of tricuspid stenosis.  Aortic Valve: The aortic valve is normal in structure. Aortic valve regurgitation is mild. Aortic regurgitation PHT measures 1125 msec. No aortic stenosis is present.  Pulmonic Valve: The pulmonic valve was  normal in structure. Pulmonic valve regurgitation is not visualized. No evidence of pulmonic stenosis.  Aorta: The aortic root is normal in size and structure.  Venous: The inferior vena cava is normal in size with greater than 50% respiratory variability, suggesting right atrial pressure of 3 mmHg.  IAS/Shunts: No atrial level shunt detected by color flow Doppler.   LEFT VENTRICLE PLAX 2D LVIDd:         4.50 cm   Diastology LVIDs:         3.20 cm   LV e' medial:    4.57 cm/s LV PW:         1.30 cm   LV E/e' medial:  12.1 LV IVS:        1.40 cm   LV e' lateral:   5.88  cm/s LVOT diam:     2.20 cm   LV E/e' lateral: 9.4 LV SV:         89 LV SV Index:   43        2D Longitudinal Strain LVOT Area:     3.80 cm  2D Strain GLS Avg:     -14.0 %   RIGHT VENTRICLE             IVC RV Basal diam:  3.40 cm     IVC diam: 2.30 cm RV Mid diam:    2.80 cm RV S prime:     12.45 cm/s TAPSE (M-mode): 3.1 cm  LEFT ATRIUM             Index        RIGHT ATRIUM           Index LA diam:        3.40 cm 1.64 cm/m   RA Area:     15.90 cm LA Vol (A2C):   37.5 ml 18.09 ml/m  RA Volume:   39.60 ml  19.10 ml/m LA Vol (A4C):   46.2 ml 22.29 ml/m LA Biplane Vol: 41.8 ml 20.16 ml/m AORTIC VALVE LVOT Vmax:   104.00 cm/s LVOT Vmean:  69.950 cm/s LVOT VTI:    0.234 m AI PHT:      1125 msec  AORTA Ao Root diam: 3.40 cm Ao Asc diam:  3.60 cm  MITRAL VALVE               TRICUSPID VALVE MV Area (PHT): 2.85 cm    TR Peak grad:   32.3 mmHg MV Decel Time: 267 msec    TR Vmax:        284.00 cm/s MR Peak grad: 107.3 mmHg MR Vmax:      518.00 cm/s  SHUNTS MV E velocity: 55.50 cm/s  Systemic VTI:  0.23 m MV A velocity: 81.75 cm/s  Systemic Diam: 2.20 cm MV E/A ratio:  0.68  Lamar Fitch MD Electronically signed by Lamar Fitch MD Signature Date/Time: 07/31/2023/12:05:08 PM    Final   TEE  ECHO TEE 11/21/2019  Narrative TRANSESOPHOGEAL ECHO REPORT    Patient Name:   MONTIE LULLA HOIT Date of Exam: 11/21/2019 Medical Rec #:  [id number]      Height:       62.0 in Accession #:    [id number]     Weight:       171.3 lb Date of Birth:  1955-11-03      BSA:          1.790 m Patient Age:    7 years  BP:           160/80 mmHg Patient Gender: F              HR:           89 bpm. Exam Location:  Inpatient  Procedure: Transesophageal Echo, 3D Echo, Limited Color Doppler and Cardiac Doppler  Indications:     Mitral Regurgitation  History:         Patient has prior history of Echocardiogram examinations, most recent 11/18/2019. CHF, Signs/Symptoms:Murmur;  Risk Factors:Hypertension, Dyslipidemia and Non-Smoker. GERD.  Sonographer:     Recardo Hedge RDCS Referring Phys:  [id number] [facility] Diagnosing Phys: Vina Gull MD  PROCEDURE: The transesophogeal probe was passed without difficulty through the esophogus of the patient. Local oropharyngeal anesthetic was provided with Cetacaine . Sedation performed by different physician. The patient was monitored while under deep sedation. Anesthestetic sedation was provided intravenously by Anesthesiology: 271.16mg  of Propofol . The patient developed no complications during the procedure.  IMPRESSIONS   1. Significant hypertrophy of the papillary muscles. . The left ventricle has mild to moderately decreased function. 2. Right ventricular systolic function is moderately reduced. The right ventricular size is mildly enlarged. 3. Left atrial size was mildly dilated. No left atrial/left atrial appendage thrombus was detected. 4. Right atrial size was moderately dilated. 5. Mitral valve annulus measures 37 mm The MV appears normal. There are multiple jets of MR making quantification diffiicult. The largest jet is directed more posteriorly into LA. MR is moderate to moderately severe in severity. (3-4/4) Carpentier Type IIIb 6. Tricuspid valve regurgitation is moderate to severe. 7. The aortic valve is tricuspid. Aortic valve regurgitation is mild. Mild aortic valve sclerosis is present, with no evidence of aortic valve stenosis. 8. Minimal fixed plaquing in the thoracic aorta. 9. Main PA appears dilated at 38 mm.  FINDINGS Left Ventricle: Significant hypertrophy of the papillary muscles. The left ventricle has mild to moderately decreased function. The left ventricular internal cavity size was normal in size.  Right Ventricle: The right ventricular size is mildly enlarged. Right vetricular wall thickness was not assessed. Right ventricular systolic function is moderately reduced.  Left Atrium:  Left atrial size was mildly dilated. No left atrial/left atrial appendage thrombus was detected.  Right Atrium: Right atrial size was moderately dilated.  Mitral Valve: Mitral valve annulus measures 37 mm The MV appears normal there are multiple jets of MR making quantification diffiicult. The largest jet is directed more posteriorly into LA. The MR appears moderate to moderately severe in severity. (3-4/4). Carpentier Type IIIb.  Tricuspid Valve: The tricuspid valve is normal in structure. Tricuspid valve regurgitation is moderate to severe.  Aortic Valve: The aortic valve is tricuspid. Aortic valve regurgitation is mild. Mild aortic valve sclerosis is present, with no evidence of aortic valve stenosis.  Pulmonic Valve: The pulmonic valve was normal in structure. Pulmonic valve regurgitation is mild.  Aorta: Minimal fixed plaquing in the thoracic aorta. The aortic root is normal in size and structure.  Pulmonary Artery: Main PA appears dilated at 38 mm.   MR Peak grad:    145.4 mmHg MR Mean grad:    85.0 mmHg MR Vmax:         603.00 cm/s MR Vmean:        422.0 cm/s MR PISA:         1.57 cm MR PISA Eff ROA: 10 mm MR PISA Radius:  0.50 cm  Vina Gull MD Electronically signed by Vina Gull  MD Signature Date/Time: 11/21/2019/5:59:37 PM    Final        ______________________________________________________________________________________________          Recent Labs: 05/27/2023: ALT 10; BUN 11; Creatinine, Ser 0.73; Hemoglobin 10.9; Platelets 237; Potassium 3.7; Sodium 138 08/07/2023: TSH 2.730  Recent Lipid Panel    Component Value Date/Time   CHOL 215 (H) 05/27/2023 0931   TRIG 53 05/27/2023 0931   HDL 84 05/27/2023 0931   CHOLHDL 2.6 05/27/2023 0931   LDLCALC 122 (H) 05/27/2023 0931    Physical Exam:    VS:  LMP 04/14/2010     Wt Readings from Last 3 Encounters:  10/05/23 250 lb (113.4 kg)  08/18/23 246 lb (111.6 kg)  08/07/23 244 lb (110.7 kg)     GEN:  *** Well nourished, well developed in no acute distress HEENT: Normal NECK: No JVD; No carotid bruits LYMPHATICS: No lymphadenopathy CARDIAC: ***RRR, no murmurs, rubs, gallops RESPIRATORY:  Clear to auscultation without rales, wheezing or rhonchi  ABDOMEN: Soft, non-tender, non-distended MUSCULOSKELETAL:  No edema; No deformity  SKIN: Warm and dry NEUROLOGIC:  Alert and oriented x 3 PSYCHIATRIC:  Normal affect    Signed, Redell Leiter, MD  10/05/2023 8:09 PM    [facility]

## 2023-10-05 NOTE — Assessment & Plan Note (Addendum)
 Obesity contributes to joint pain and limits activity. Previous dietary changes and medications were ineffective. She is interested in gastric bypass surgery. - Refer to gastric bypass surgeon in [place] for evaluation.  Orders:   Ambulatory referral to General Surgery

## 2023-10-05 NOTE — Assessment & Plan Note (Addendum)
 Monitor tsh, free T4 every 6 months.  No further work up needed.

## 2023-10-05 NOTE — Assessment & Plan Note (Addendum)
 The current medical regimen is effective;  continue present plan and medications. Continue levothyroxine  100 mcg once daily in am.

## 2023-10-05 NOTE — Assessment & Plan Note (Addendum)
 Well controlled.  At goal. Continue atorvastatin  at 80 mg before bed.  Orders:   POCT Lipid Panel

## 2023-10-05 NOTE — Progress Notes (Signed)
 I discussed with radiology.  This is definitively thyroid  tissue. This can occur post thyroidectomy.  This can be active thyroid  tissue.  I will discuss at her appointment today. Dr. Sherre

## 2023-10-05 NOTE — Assessment & Plan Note (Addendum)
 Recommend continue to work on eating healthy diet and exercise.  Orders:   POCT glycosylated hemoglobin (Hb A1C)

## 2023-10-05 NOTE — Assessment & Plan Note (Addendum)
 Hypertension is well-controlled with valsartan  160 mg daily. - Continue valsartan  160 mg daily.

## 2023-10-07 ENCOUNTER — Ambulatory Visit: Admitting: Cardiology

## 2023-10-21 ENCOUNTER — Other Ambulatory Visit: Payer: Self-pay | Admitting: Family Medicine

## 2023-10-21 DIAGNOSIS — F331 Major depressive disorder, recurrent, moderate: Secondary | ICD-10-CM

## 2023-10-21 MED ORDER — ESCITALOPRAM OXALATE 10 MG PO TABS: 10.0000 mg | ORAL_TABLET | Freq: Every day | ORAL | 1 refills | Status: AC

## 2023-11-10 ENCOUNTER — Encounter: Payer: Self-pay | Admitting: Family Medicine

## 2023-11-10 NOTE — Progress Notes (Unsigned)
 Cardiology Office Note:    Date:  11/10/2023   ID:  Donna Mayer, DOB 20-May-1955, MRN [id number]  PCP:  Sherre Clapper, MD  Cardiologist:  Redell Leiter, MD    Referring MD: Sherre Clapper, MD    ASSESSMENT:    1. Hypertensive heart disease without heart failure   2. Nonrheumatic mitral valve regurgitation   3. Mixed hyperlipidemia    PLAN:    In order of problems listed above:  She has had a remarkable normalization of ejection fraction for all purposes reversal of functional mitral regurgitation Hypertension is well continue her current ARB thiazide diuretic at home self monitor Will continue her current statin At this point I think I could see her in the future as needed she is comfortable following up with Dr. Sherre and if either than contact me I will bring her back to my office.   Next appointment: As needed   Medication Adjustments/Labs and Tests Ordered: Current medicines are reviewed at length with the patient today.  Concerns regarding medicines are outlined above.  No orders of the defined types were placed in this encounter.  No orders of the defined types were placed in this encounter.    History of Present Illness:    Donna Mayer is a 68 y.o. female with a hx of hypertension abnormal EKG suggesting LVH and repolarization previous left ventricular dysfunction showing thyrotoxicosis and moderate to severe mitral regurgitation last seen by my partner 07/02/2023.  She had an echocardiogram performed on 07/31/2023 left ventricle normal in size mild LVH normal ejection fraction 60 to 65% and grade 1 diastolic dysfunction right ventricle normal size function and pulmonary artery pressure mitral regurgitation was mild aortic regurgitation was mild.  Compliance with diet, lifestyle and medications: Yes  She is seeing endocrine surgery concerned about ongoing thyroid  dysfunction She check home blood pressure runs in the range 130/65-70. She feels well and is having  no cardiovascular symptoms of edema shortness of breath chest pain palpitation or syncope She had a remarkable improvement with normalization of ejection fraction and decrease from her mitral regurgitation from severe to mild Past Medical History:  Diagnosis Date   Abnormal EKG 05/27/2023   Acute CHF (congestive heart failure) (HCC) 11/16/2019   Anemia 11/16/2019   At risk for loss of bone density 05/27/2023   Cardiac murmur    CHF (congestive heart failure) (HCC)    Chronic idiopathic constipation 07/12/2023   Chronic pain    Chronic pain of left knee 05/27/2023   Chronic systolic heart failure (HCC)    Class 3 severe obesity due to excess calories with serious comorbidity and body mass index (BMI) of 45.0 to 49.9 in adult (HCC) 06/13/2022   Depression, major, recurrent, mild 05/24/2019   Ectopic thyroid  tissue 10/05/2023   Eczema 02/10/2022   Elevated troponin 11/16/2019   Encounter for immunization 12/28/2022   Encounter for mammogram to establish baseline mammogram 02/20/2021   Encounter for osteoporosis screening in asymptomatic postmenopausal patient 12/28/2022   Essential hypertension, benign 05/13/2019   Foot pain, left 09/27/2022   GERD (gastroesophageal reflux disease)    Graves disease 05/24/2019   Graves disease 05/24/2019   Graves' disease with exophthalmos 10/06/2019   Hypertensive urgency 11/16/2019   Hyperthyroidism 05/13/2019   Hyperthyroidism 05/13/2019   IDA (iron deficiency anemia)    Impaired fasting glucose 12/28/2022   Influenza A 02/12/2023   Left thyroid  nodule 10/06/2019   Lumbar back pain 10/09/2021   LV dysfunction 07/02/2023   Mild  vitamin D  deficiency 06/13/2022   Mitral regurgitation 07/02/2023   Mixed hyperlipidemia    Moderate recurrent major depression (HCC) 10/09/2021   Muscle spasm 02/10/2022   Need for influenza vaccination 10/09/2021   Non-intractable vomiting 05/24/2019   Obesity, Class III, BMI 40-49.9 (morbid obesity) (HCC)  06/06/2020   Other insomnia    Palpitations 05/19/2019   Plantar fasciitis of right foot 10/07/2022   Post-surgical hypothyroidism 01/16/2020   Prediabetes 02/20/2021   Primary insomnia    Severe episode of recurrent major depressive disorder, without psychotic features (HCC) 05/24/2019   Shortness of breath 05/27/2023   Thoracic aortic aneurysm without rupture 08/02/2020   Unexplained weight gain 12/28/2022   Vitamin D  insufficiency 05/27/2023   Weight loss 05/24/2019    Current Medications: No outpatient medications have been marked as taking for the 11/11/23 encounter (Appointment) with Monetta Redell PARAS, MD.      EKGs/Labs/Other Studies Reviewed:    The following studies were reviewed today:  Cardiac Studies & Procedures   ______________________________________________________________________________________________     ECHOCARDIOGRAM  ECHOCARDIOGRAM COMPLETE 07/31/2023  Narrative ECHOCARDIOGRAM REPORT    Patient Name:   Donna Mayer Date of Exam: 07/31/2023 Medical Rec #:  [id number]      Height:       62.0 in Accession #:    [id number]     Weight:       242.0 lb Date of Birth:  04/07/1955      BSA:          2.073 m Patient Age:    67 years       BP:           146/88 mmHg Patient Gender: F              HR:           64 bpm. Exam Location:  [facility]  Procedure: 2D Echo, Cardiac Doppler, Color Doppler and Strain Analysis (Both Spectral and Color Flow Doppler were utilized during procedure).  Indications:    Abnormal EKG [R94.31 (ICD-10-CM)]; Essential hypertension, benign [I10 (ICD-10-CM)]; LV dysfunction [I51.9 (ICD-10-CM)]; Nonrheumatic mitral valve regurgitation [I34.0 (ICD-10-CM)]  History:        Patient has prior history of Echocardiogram examinations, most recent 11/18/2019. Abnormal ECG; Risk Factors:Hypertension.  Sonographer:    Charlie Jointer RDCS Referring Phys: [id number] ALEAN SAUNDERS MADIREDDY   Sonographer Comments: Suboptimal subcostal window.  Image acquisition challenging due to patient body habitus. IMPRESSIONS   1. Left ventricular ejection fraction, by estimation, is 60 to 65%. The left ventricle has normal function. The left ventricle has no regional wall motion abnormalities. There is mild left ventricular hypertrophy. Left ventricular diastolic parameters are consistent with Grade I diastolic dysfunction (impaired relaxation). The average left ventricular global longitudinal strain is -14.0 %. The global longitudinal strain is abnormal. 2. Right ventricular systolic function is normal. The right ventricular size is normal. There is normal pulmonary artery systolic pressure. 3. The mitral valve is normal in structure. Mild mitral valve regurgitation. No evidence of mitral stenosis. 4. The aortic valve is normal in structure. Aortic valve regurgitation is mild. No aortic stenosis is present. 5. The inferior vena cava is normal in size with greater than 50% respiratory variability, suggesting right atrial pressure of 3 mmHg.  FINDINGS Left Ventricle: Left ventricular ejection fraction, by estimation, is 60 to 65%. The left ventricle has normal function. The left ventricle has no regional wall motion abnormalities. The average left ventricular global longitudinal strain is -14.0 %. Strain  was performed and the global longitudinal strain is abnormal. The left ventricular internal cavity size was normal in size. There is mild left ventricular hypertrophy. Left ventricular diastolic parameters are consistent with Grade I diastolic dysfunction (impaired relaxation).  Right Ventricle: The right ventricular size is normal. No increase in right ventricular wall thickness. Right ventricular systolic function is normal. There is normal pulmonary artery systolic pressure. The tricuspid regurgitant velocity is 2.84 m/s, and with an assumed right atrial pressure of 3 mmHg, the estimated right ventricular systolic pressure is 35.3 mmHg.  Left  Atrium: Left atrial size was normal in size.  Right Atrium: Right atrial size was normal in size.  Pericardium: There is no evidence of pericardial effusion.  Mitral Valve: The mitral valve is normal in structure. Mild mitral valve regurgitation. No evidence of mitral valve stenosis.  Tricuspid Valve: The tricuspid valve is normal in structure. Tricuspid valve regurgitation is not demonstrated. No evidence of tricuspid stenosis.  Aortic Valve: The aortic valve is normal in structure. Aortic valve regurgitation is mild. Aortic regurgitation PHT measures 1125 msec. No aortic stenosis is present.  Pulmonic Valve: The pulmonic valve was normal in structure. Pulmonic valve regurgitation is not visualized. No evidence of pulmonic stenosis.  Aorta: The aortic root is normal in size and structure.  Venous: The inferior vena cava is normal in size with greater than 50% respiratory variability, suggesting right atrial pressure of 3 mmHg.  IAS/Shunts: No atrial level shunt detected by color flow Doppler.   LEFT VENTRICLE PLAX 2D LVIDd:         4.50 cm   Diastology LVIDs:         3.20 cm   LV e' medial:    4.57 cm/s LV PW:         1.30 cm   LV E/e' medial:  12.1 LV IVS:        1.40 cm   LV e' lateral:   5.88 cm/s LVOT diam:     2.20 cm   LV E/e' lateral: 9.4 LV SV:         89 LV SV Index:   43        2D Longitudinal Strain LVOT Area:     3.80 cm  2D Strain GLS Avg:     -14.0 %   RIGHT VENTRICLE             IVC RV Basal diam:  3.40 cm     IVC diam: 2.30 cm RV Mid diam:    2.80 cm RV S prime:     12.45 cm/s TAPSE (M-mode): 3.1 cm  LEFT ATRIUM             Index        RIGHT ATRIUM           Index LA diam:        3.40 cm 1.64 cm/m   RA Area:     15.90 cm LA Vol (A2C):   37.5 ml 18.09 ml/m  RA Volume:   39.60 ml  19.10 ml/m LA Vol (A4C):   46.2 ml 22.29 ml/m LA Biplane Vol: 41.8 ml 20.16 ml/m AORTIC VALVE LVOT Vmax:   104.00 cm/s LVOT Vmean:  69.950 cm/s LVOT VTI:    0.234 m AI  PHT:      1125 msec  AORTA Ao Root diam: 3.40 cm Ao Asc diam:  3.60 cm  MITRAL VALVE  TRICUSPID VALVE MV Area (PHT): 2.85 cm    TR Peak grad:   32.3 mmHg MV Decel Time: 267 msec    TR Vmax:        284.00 cm/s MR Peak grad: 107.3 mmHg MR Vmax:      518.00 cm/s  SHUNTS MV E velocity: 55.50 cm/s  Systemic VTI:  0.23 m MV A velocity: 81.75 cm/s  Systemic Diam: 2.20 cm MV E/A ratio:  0.68  Lamar Fitch MD Electronically signed by Lamar Fitch MD Signature Date/Time: 07/31/2023/12:05:08 PM    Final   TEE  ECHO TEE 11/21/2019  Narrative TRANSESOPHOGEAL ECHO REPORT    Patient Name:   MONTIE LULLA HOIT Date of Exam: 11/21/2019 Medical Rec #:  [id number]      Height:       62.0 in Accession #:    [id number]     Weight:       171.3 lb Date of Birth:  11-25-1955      BSA:          1.790 m Patient Age:    64 years       BP:           160/80 mmHg Patient Gender: F              HR:           89 bpm. Exam Location:  Inpatient  Procedure: Transesophageal Echo, 3D Echo, Limited Color Doppler and Cardiac Doppler  Indications:     Mitral Regurgitation  History:         Patient has prior history of Echocardiogram examinations, most recent 11/18/2019. CHF, Signs/Symptoms:Murmur; Risk Factors:Hypertension, Dyslipidemia and Non-Smoker. GERD.  Sonographer:     Recardo Hedge RDCS Referring Phys:  [id number] [facility] Diagnosing Phys: Vina Gull MD  PROCEDURE: The transesophogeal probe was passed without difficulty through the esophogus of the patient. Local oropharyngeal anesthetic was provided with Cetacaine . Sedation performed by different physician. The patient was monitored while under deep sedation. Anesthestetic sedation was provided intravenously by Anesthesiology: 271.16mg  of Propofol . The patient developed no complications during the procedure.  IMPRESSIONS   1. Significant hypertrophy of the papillary muscles. . The left ventricle has mild to  moderately decreased function. 2. Right ventricular systolic function is moderately reduced. The right ventricular size is mildly enlarged. 3. Left atrial size was mildly dilated. No left atrial/left atrial appendage thrombus was detected. 4. Right atrial size was moderately dilated. 5. Mitral valve annulus measures 37 mm The MV appears normal. There are multiple jets of MR making quantification diffiicult. The largest jet is directed more posteriorly into LA. MR is moderate to moderately severe in severity. (3-4/4) Carpentier Type IIIb 6. Tricuspid valve regurgitation is moderate to severe. 7. The aortic valve is tricuspid. Aortic valve regurgitation is mild. Mild aortic valve sclerosis is present, with no evidence of aortic valve stenosis. 8. Minimal fixed plaquing in the thoracic aorta. 9. Main PA appears dilated at 38 mm.  FINDINGS Left Ventricle: Significant hypertrophy of the papillary muscles. The left ventricle has mild to moderately decreased function. The left ventricular internal cavity size was normal in size.  Right Ventricle: The right ventricular size is mildly enlarged. Right vetricular wall thickness was not assessed. Right ventricular systolic function is moderately reduced.  Left Atrium: Left atrial size was mildly dilated. No left atrial/left atrial appendage thrombus was detected.  Right Atrium: Right atrial size was moderately dilated.  Mitral Valve: Mitral valve annulus measures 37 mm The  MV appears normal there are multiple jets of MR making quantification diffiicult. The largest jet is directed more posteriorly into LA. The MR appears moderate to moderately severe in severity. (3-4/4). Carpentier Type IIIb.  Tricuspid Valve: The tricuspid valve is normal in structure. Tricuspid valve regurgitation is moderate to severe.  Aortic Valve: The aortic valve is tricuspid. Aortic valve regurgitation is mild. Mild aortic valve sclerosis is present, with no evidence of aortic  valve stenosis.  Pulmonic Valve: The pulmonic valve was normal in structure. Pulmonic valve regurgitation is mild.  Aorta: Minimal fixed plaquing in the thoracic aorta. The aortic root is normal in size and structure.  Pulmonary Artery: Main PA appears dilated at 38 mm.   MR Peak grad:    145.4 mmHg MR Mean grad:    85.0 mmHg MR Vmax:         603.00 cm/s MR Vmean:        422.0 cm/s MR PISA:         1.57 cm MR PISA Eff ROA: 10 mm MR PISA Radius:  0.50 cm  Vina Gull MD Electronically signed by Vina Gull MD Signature Date/Time: 11/21/2019/5:59:37 PM    Final        ______________________________________________________________________________________________             Recent Labs: 05/27/2023: ALT 10; BUN 11; Creatinine, Ser 0.73; Hemoglobin 10.9; Platelets 237; Potassium 3.7; Sodium 138 08/07/2023: TSH 2.730  Recent Lipid Panel    Component Value Date/Time   CHOL 215 (H) 05/27/2023 0931   TRIG 53 05/27/2023 0931   HDL 84 05/27/2023 0931   CHOLHDL 2.6 05/27/2023 0931   LDLCALC 122 (H) 05/27/2023 0931    Physical Exam:    VS:  LMP 04/14/2010     Wt Readings from Last 3 Encounters:  10/05/23 250 lb (113.4 kg)  08/18/23 246 lb (111.6 kg)  08/07/23 244 lb (110.7 kg)     GEN:  Well nourished, well developed in no acute distress HEENT: Normal NECK: No JVD; No carotid bruits LYMPHATICS: No lymphadenopathy CARDIAC: RRR, no murmurs, rubs, gallops RESPIRATORY:  Clear to auscultation without rales, wheezing or rhonchi  ABDOMEN: Soft, non-tender, non-distended MUSCULOSKELETAL:  No edema; No deformity  SKIN: Warm and dry NEUROLOGIC:  Alert and oriented x 3 PSYCHIATRIC:  Normal affect    Signed, Redell Leiter, MD  11/10/2023 8:18 PM    [facility]

## 2023-11-11 ENCOUNTER — Ambulatory Visit: Attending: Cardiology | Admitting: Cardiology

## 2023-11-11 ENCOUNTER — Encounter: Payer: Self-pay | Admitting: Cardiology

## 2023-11-11 VITALS — BP 138/90 | HR 72 | Ht 62.0 in | Wt 259.4 lb

## 2023-11-11 DIAGNOSIS — E782 Mixed hyperlipidemia: Secondary | ICD-10-CM

## 2023-11-11 DIAGNOSIS — I119 Hypertensive heart disease without heart failure: Secondary | ICD-10-CM

## 2023-11-11 DIAGNOSIS — I340 Nonrheumatic mitral (valve) insufficiency: Secondary | ICD-10-CM | POA: Diagnosis not present

## 2023-11-11 NOTE — Patient Instructions (Signed)
 Medication Instructions:  Your physician recommends that you continue on your current medications as directed. Please refer to the Current Medication list given to you today.  *If you need a refill on your cardiac medications before your next appointment, please call your pharmacy*  Lab Work: None If you have labs (blood work) drawn today and your tests are completely normal, you will receive your results only by: MyChart Message (if you have MyChart) OR A paper copy in the mail If you have any lab test that is abnormal or we need to change your treatment, we will call you to review the results.  Testing/Procedures: None  Follow-Up: At [facility], you and your health needs are our priority.  As part of our continuing mission to provide you with exceptional heart care, our providers are all part of one team.  This team includes your primary Cardiologist (physician) and Advanced Practice Providers or APPs (Physician Assistants and Nurse Practitioners) who all work together to provide you with the care you need, when you need it.  Your next appointment:   Follow up as needed  Provider:   Redell Leiter, MD    We recommend signing up for the patient portal called MyChart.  Sign up information is provided on this After Visit Summary.  MyChart is used to connect with patients for Virtual Visits (Telemedicine).  Patients are able to view lab/test results, encounter notes, upcoming appointments, etc.  Non-urgent messages can be sent to your provider as well.   To learn more about what you can do with MyChart, go to [email or web address].   Other Instructions None

## 2023-11-12 ENCOUNTER — Telehealth: Payer: Self-pay | Admitting: Family Medicine

## 2023-11-12 NOTE — Telephone Encounter (Signed)
 Copied from CRM [id number]. Topic: General - Call Back - No Documentation >> Nov 12, 2023 10:59 AM Olam RAMAN wrote: Reason for CRM: ENT Dr bates calling about a ct scan on the neck and needs to speak about result. [phone] NEEDS TO SPEAK TO DR COX

## 2023-11-12 NOTE — Telephone Encounter (Signed)
 I spoke with Dr. Carlie. Dr. Sherre

## 2023-11-13 DIAGNOSIS — Z9889 Other specified postprocedural states: Secondary | ICD-10-CM | POA: Insufficient documentation

## 2023-11-15 ENCOUNTER — Encounter: Payer: Self-pay | Admitting: Family Medicine

## 2023-11-16 ENCOUNTER — Other Ambulatory Visit: Payer: Self-pay | Admitting: Family Medicine

## 2023-11-16 DIAGNOSIS — E782 Mixed hyperlipidemia: Secondary | ICD-10-CM

## 2023-11-20 ENCOUNTER — Other Ambulatory Visit (HOSPITAL_COMMUNITY): Payer: Self-pay | Admitting: General Surgery

## 2023-11-20 ENCOUNTER — Telehealth: Payer: Self-pay

## 2023-11-20 NOTE — Telephone Encounter (Signed)
   Name: Donna Mayer  DOB: 15-Sep-1955  MRN: [id number]   Primary Cardiologist: Redell Leiter, MD  Chart reviewed as part of pre-operative protocol coverage. Donna Mayer was last seen on 11/11/2023 by Dr. Leiter. Per Dr. Leiter She has seen back in the office doing exceptionally well optimized for planned procedure.  Therefore, based on ACC/AHA guidelines, the patient would be an acceptable risk for the planned procedure without further cardiovascular testing.   I will route this recommendation to the requesting party via Epic fax function and remove from pre-op pool. Please call with questions.  Barnie Hila, NP 11/20/2023, 4:58 PM

## 2023-11-20 NOTE — Telephone Encounter (Signed)
   Pre-operative Risk Assessment    Patient Name: Donna Mayer  DOB: [birth year] MRN: [id number]   Date of last office visit: 11/11/2023 Date of next office visit: N/A   Request for Surgical Clearance    Procedure:  Bariatric surgery  Date of Surgery:  Clearance TBD                                Surgeon:  Dr. Camellia Blush Surgeon's Group or Practice Name:  [facility] Phone number:  [phone] Fax number:  [phone]   Type of Clearance Requested:   - Medical    Type of Anesthesia:  General    Additional requests/questions:    Bonney Calvert Pouch   11/20/2023, 3:36 PM

## 2023-11-20 NOTE — Telephone Encounter (Signed)
 Dr. Monetta,   You saw this patient on 11/11/2023. Per office protocol, will you please comment on medical clearance for bariatric surgery?  Please route your response to P CV DIV Preop. I will communicate with requesting office once you have given recommendations.   Thank you!  Barnie Hila, NP

## 2023-11-23 NOTE — Telephone Encounter (Signed)
 NOTES HAVE BEEN FAXED TO DR. CAMELLIA BLUSH

## 2023-11-24 ENCOUNTER — Encounter: Payer: Self-pay | Admitting: Dietician

## 2023-11-24 ENCOUNTER — Encounter: Attending: General Surgery | Admitting: Dietician

## 2023-11-24 VITALS — Ht 62.0 in | Wt 261.6 lb

## 2023-11-24 DIAGNOSIS — Z6841 Body Mass Index (BMI) 40.0 and over, adult: Secondary | ICD-10-CM | POA: Insufficient documentation

## 2023-11-24 DIAGNOSIS — E669 Obesity, unspecified: Secondary | ICD-10-CM | POA: Insufficient documentation

## 2023-11-24 DIAGNOSIS — Z713 Dietary counseling and surveillance: Secondary | ICD-10-CM | POA: Insufficient documentation

## 2023-11-24 NOTE — Progress Notes (Signed)
 Nutrition Assessment for Bariatric Surgery: Pre-Surgery Behavioral and Nutrition Intervention Program   Medical Nutrition Therapy  Appt Start Time: 0912    End Time: 1000  Patient was seen on 11/24/2023 for Pre-Operative Nutrition Assessment. Purpose of todays visit  enhance perioperative outcomes along with a healthy weight maintenance   Referral stated Supervised Weight Loss (SWL) visits needed: 6 Month  Planned surgery: Conversion from Gastric Band to Gastric By-Pass Pt expectation of surgery: Wt loss  NUTRITION ASSESSMENT   Anthropometrics  Start weight at [facility]: 261.6 lbs (date: 11/24/2023)  Height: 62 in BMI: 47.85 kg/m2     Clinical   Pharmacotherapy: History of weight loss medication used: Ozempic , Phentermine Pt reports previous nutrition counseling at a wt loss clinic.   Medical hx: GERD, Thyroid  Disease, Hyperlipidemia, HTN Medications: Nexium , Levothyroxine , Lipitor, Valsartan -Hydrochlorothiazide , Ambien  PRN Labs: no recent labs in EMR (Planning to complete for pathway) Notable signs/symptoms: none noted Any previous deficiencies? No  Evaluation of Nutritional Deficiencies: Micronutrient Nutrition Focused Physical Exam: Hair: No issues observed Eyes: No issues observed Mouth: No issues observed Neck: No issues observed Nails: No issues observed Skin: No issues observed  Lifestyle & Dietary Hx Pt states she went through lap band surgery around 15yrs ago. Has went through dieting and experienced wt loss in the past.  Current Physical Activity Recommendations state 150 minutes per week of moderate to vigorous movement including Cardio and 1-2 days of resistance activities as well as flexibility/balance activities:  Pts current physical activity: Walking 1-2 miles/day at the gym for 45 mins, with 100% recommendation reached   Sleep Hygiene: duration and quality: Pt reports she often wakes up several times throughout the night to use the restroom. Also reports  sleeping trouble most nights. Takes medication PRN.   Current Patient Perceived Stress Level as stated by pt on a scale of 1-10: 4       Stress Management Techniques: Prayer, being at home/massage chair   According to the Dietary Guidelines for Americans Recommendation: equivalent 1.5-2 cups fruits per day, equivalent 2-3 cups vegetables per day and at least half all grains whole  Fruit servings per day (on average): 0, meeting 0% recommendation  Non-starchy vegetable servings per day (on average): 2, meeting 66-100% recommendation  Whole Grains per day (on average): 0-1  Number of meals missed/skipped per week out of 21: 7  24-Hr Dietary Recall First Meal: Skipped Snack:  Second Meal: [employer] (Grilled or fried chicken sandwich w/ fries) or restaurant cauliflower pizza Snack:  Third Meal: Restaurant: Grilled chicken wrap w/ lettuce & tomato  Snack:  Beverages: Water , Mushroom Coffee, Grapefruit juice  Pt reports drinking 64oz water  daily.   Alcoholic beverages per week: 0   Estimated Energy Needs Calories: 1500  NUTRITION DIAGNOSIS  Overweight/obesity ([place]-3.3) related to past poor dietary habits and physical inactivity as evidenced by patient w/ planned gastric bypass surgery following dietary guidelines for continued weight loss.  NUTRITION INTERVENTION  Nutrition counseling (C-1) and education (E-2) to facilitate bariatric surgery goals.  Educated pt on micronutrient deficiencies post-surgery and behavioral/dietary strategies to start in order to mitigate that risk   Behavioral and Dietary Interventions Pre-Op Goals Reviewed with the Patient Nutrition: Healthy Eating Behaviors Switch to non-caloric, non-carbonated and non-caffeinated beverages such as  water , unsweetened tea, Crystal Light and zero calorie beverages (aim for 64 oz. per day) Cut out grazing between meals or at night  Find a protein shake you like Eat every 3-5 hours        Eliminate  distractions while  eating (TV, computer, reading, driving, texting) Take 79-69 minutes to eat a meal  Decrease high sugar foods/decrease high fat/fried foods Eliminate alcoholic beverages Increase protein intake (eggs, fish, chicken, yogurt) before surgery. Aim for protein at every meal &/or snack. Eat non starchy vegetables 2 times a day 7 days a week Eat complex carbohydrates such as whole grains and fruits   Behavioral Modification: Physical Activity Increase my usual daily activity (use stairs, park farther, etc.) Engage in _______________________  activity  _______ minutes ______ times per week  Other:    _________________________________________________________________     Problem Solving I will think about my usual eating patterns and how to tweak them How can my friends and family support me Barriers to starting my changes Learn and understand appetite verses hunger   Healthy Coping Allow for ___________ activities per week to help me manage stress Reframe negative thoughts I will keep a picture of someone or something that is my inspiration & look at it daily   Monitoring  Weigh myself once a week  Measure my progress by monitoring how my clothes fit Keep a food record of what I eat and drink for the next ________ (time period) Take pictures of what I eat and drink for the next ________ (time period) Use an app to count steps/day for the next_______ (time period) Measure my progress such as increased energy and more restful sleep Monitor your acid reflux and bowel habits, are they getting better?   *Goals that are bolded indicate the pt would like to start working towards these  Handouts Provided Include  Bariatric Surgery handouts (Nutrition Visits, Pre Surgery Behavioral Change Goals, Protein Shakes Brands to Choose From, Vitamins & Mineral Supplementation)  Learning Style & Readiness for Change Teaching method utilized: Visual, Auditory, and hands on  Demonstrated degree of  understanding via: Teach Back  Readiness Level: Preparation Barriers to learning/adherence to lifestyle change: Nothing Identified  RD's Notes for Next Visit Patient progress towards chosen goals.    MONITORING & EVALUATION Dietary intake, weekly physical activity, body weight, and preoperative behavioral change goals   Next Steps  Patient is to follow up at [facility] in 2-4 weeks for 1st SWL visit.

## 2023-12-07 ENCOUNTER — Ambulatory Visit (HOSPITAL_COMMUNITY)
Admission: RE | Admit: 2023-12-07 | Discharge: 2023-12-07 | Disposition: A | Discharge status: Home / Self Care | Source: Ambulatory Visit | Attending: General Surgery | Admitting: General Surgery

## 2023-12-09 ENCOUNTER — Other Ambulatory Visit: Payer: Self-pay

## 2023-12-09 DIAGNOSIS — D509 Iron deficiency anemia, unspecified: Secondary | ICD-10-CM

## 2023-12-14 ENCOUNTER — Other Ambulatory Visit

## 2023-12-14 DIAGNOSIS — D509 Iron deficiency anemia, unspecified: Secondary | ICD-10-CM

## 2023-12-15 ENCOUNTER — Encounter: Payer: Self-pay | Admitting: Dietician

## 2023-12-15 ENCOUNTER — Encounter: Attending: General Surgery | Admitting: Dietician

## 2023-12-15 ENCOUNTER — Ambulatory Visit: Payer: Self-pay | Admitting: Family Medicine

## 2023-12-15 VITALS — Ht 62.0 in | Wt 265.4 lb

## 2023-12-15 DIAGNOSIS — E669 Obesity, unspecified: Secondary | ICD-10-CM | POA: Insufficient documentation

## 2023-12-15 DIAGNOSIS — Z6841 Body Mass Index (BMI) 40.0 and over, adult: Secondary | ICD-10-CM | POA: Insufficient documentation

## 2023-12-15 DIAGNOSIS — Z713 Dietary counseling and surveillance: Secondary | ICD-10-CM | POA: Insufficient documentation

## 2023-12-15 LAB — COMPREHENSIVE METABOLIC PANEL WITH GFR
ALT: 12 IU/L (ref 0–32)
AST: 23 IU/L (ref 0–40)
Albumin: 4.3 g/dL (ref 3.9–4.9)
Alkaline Phosphatase: 103 IU/L (ref 49–135)
BUN/Creatinine Ratio: 18 (ref 12–28)
BUN: 14 mg/dL (ref 8–27)
Bilirubin Total: 0.5 mg/dL (ref 0.0–1.2)
CO2: 26 mmol/L (ref 20–29)
Calcium: 9.2 mg/dL (ref 8.7–10.3)
Chloride: 100 mmol/L (ref 96–106)
Creatinine, Ser: 0.8 mg/dL (ref 0.57–1.00)
Globulin, Total: 2.9 g/dL (ref 1.5–4.5)
Glucose: 70 mg/dL (ref 70–99)
Potassium: 3.9 mmol/L (ref 3.5–5.2)
Sodium: 139 mmol/L (ref 134–144)
Total Protein: 7.2 g/dL (ref 6.0–8.5)
eGFR: 81 mL/min/1.73 (ref >59)

## 2023-12-15 LAB — CBC WITH DIFFERENTIAL/PLATELET
Basophils Absolute: 0 x10E3/uL (ref 0.0–0.2)
Basos: 0 %
EOS (ABSOLUTE): 0.1 x10E3/uL (ref 0.0–0.4)
Eos: 2 %
Hematocrit: 35 % (ref 34.0–46.6)
Hemoglobin: 11 g/dL — ABNORMAL LOW (ref 11.1–15.9)
Immature Grans (Abs): 0 x10E3/uL (ref 0.0–0.1)
Immature Granulocytes: 0 %
Lymphocytes Absolute: 1.3 x10E3/uL (ref 0.7–3.1)
Lymphs: 23 %
MCH: 25.9 pg — ABNORMAL LOW (ref 26.6–33.0)
MCHC: 31.4 g/dL — ABNORMAL LOW (ref 31.5–35.7)
MCV: 83 fL (ref 79–97)
Monocytes Absolute: 0.5 x10E3/uL (ref 0.1–0.9)
Monocytes: 9 %
Neutrophils Absolute: 3.6 x10E3/uL (ref 1.4–7.0)
Neutrophils: 66 %
Platelets: 251 x10E3/uL (ref 150–450)
RBC: 4.24 x10E6/uL (ref 3.77–5.28)
RDW: 14.3 % (ref 11.7–15.4)
WBC: 5.6 x10E3/uL (ref 3.4–10.8)

## 2023-12-15 LAB — VITAMIN D 25 HYDROXY (VIT D DEFICIENCY, FRACTURES): Vit D, 25-Hydroxy: 53.6 ng/mL (ref 30.0–100.0)

## 2023-12-15 LAB — IRON,TIBC AND FERRITIN PANEL
Ferritin: 20 ng/mL (ref 15–150)
Iron Saturation: 15 % (ref 15–55)
Iron: 60 ug/dL (ref 27–139)
Total Iron Binding Capacity: 391 ug/dL (ref 250–450)
UIBC: 331 ug/dL (ref 118–369)

## 2023-12-15 LAB — VITAMIN B12: Vitamin B-12: 280 pg/mL (ref 232–1245)

## 2023-12-15 LAB — HEMOGLOBIN A1C
Est. average glucose Bld gHb Est-mCnc: 108 mg/dL
Hgb A1c MFr Bld: 5.4 % (ref 4.8–5.6)

## 2023-12-15 NOTE — Progress Notes (Signed)
 Supervised Weight Loss Visit Bariatric Nutrition Education Appt Start Time: 98640    End Time: 1430  Planned surgery: Conversion from Gastric Band to Gastric By-Pass Pt expectation of surgery: Wt loss  Referral stated Supervised Weight Loss (SWL) visits needed: 6 Month  1 out of 6 SWL Appointments   NUTRITION ASSESSMENT   Anthropometrics  Start weight at [facility]: 261.6 lbs (date: 11/24/2023)  Height: 62 in Weight today: 265.4 lbs BMI: 48.54 kg/m2     Clinical   Pharmacotherapy: History of weight loss medication used: Ozempic , Phentermine Pt reports previous nutrition counseling at a wt loss clinic.   Medical hx: GERD, Thyroid  Disease, Hyperlipidemia, HTN Medications: Nexium , Levothyroxine , Lipitor, Valsartan -Hydrochlorothiazide , Ambien  PRN Labs: no recent labs in EMR (Planning to complete for pathway) Notable signs/symptoms: none noted Any previous deficiencies? No  Lifestyle & Dietary Hx Pt states she aimed for 60 grams of protein per day, and states she practiced not drinking with her meals. Pt states she tried eating small frequent meals. Pt states she still need to work on not skipping meals, stating if she eats lunch she may not eat supper. Pt states she is using a sugar substitute called [facility] no calorie sweetener  Estimated daily fluid intake: 60 oz Supplements: vit D3, magnesium , iron Current average weekly physical activity: walking at the [facility] daily (when they are not playing basketball). Thirteen laps is a mile, 5 days per week. Pt states she also uses a vibration plate 3 days per week (mostly weekends).  24-Hr Dietary Recall First Meal: two boiled eggs or oatmeal (sometimes with blueberries) or grits Snack:  Second Meal: [employer] (Grilled or fried chicken sandwich w/ fries) or restaurant cauliflower pizza Snack:  Third Meal: Restaurant: Grilled chicken wrap w/ lettuce & tomato  Snack:  Beverages: Water , Mushroom Coffee, grapefruit juice  sometimes  Pt reports drinking 64oz water  daily.   Alcoholic beverages per week: 0   Estimated Energy Needs Calories: 1500  NUTRITION DIAGNOSIS  Overweight/obesity ([place]-3.3) related to past poor dietary habits and physical inactivity as evidenced by patient w/ planned conversion from gastric band to gastric by-pass surgery following dietary guidelines for continued weight loss.  NUTRITION INTERVENTION  Nutrition counseling (C-1) and education (E-2) to facilitate bariatric surgery goals.  Aiming for three structured meals per day helps regulate energy, stabilize blood sugar, and reduce grazing or overeating. Focusing on satisfaction before fullness encourages mindful eating--listening to your body's cues so you stop when you feel nourished rather than uncomfortably full. Slowing down at mealtime is equally important: it gives your brain time to register satiety signals, improves digestion, and makes meals more enjoyable. Together, these habits support portion control, balanced nutrition, and long-term success with bariatric food patterns.  Pre-Op Goals Progress & New Goals Continue: Increase protein intake (eggs, fish, chicken, yogurt) before surgery. Aim for protein at every meal &/or snack. New: Aim for 3 meals per day; aim for satisfaction before fullness; slow down at mealtime.  Handouts Provided Include  Bariatric MyPlate  Learning Style & Readiness for Change Teaching method utilized: Visual & Auditory  Demonstrated degree of understanding via: Teach Back  Readiness Level: preparation Barriers to learning/adherence to lifestyle change: nothing identified  RD's Notes for next Visit  Patient progress toward chosen goals  MONITORING & EVALUATION Dietary intake, weekly physical activity, body weight, and pre-op goals.  Next Steps  Patient is to return to [facility] in 1 month for next SWL visit.

## 2024-01-11 ENCOUNTER — Ambulatory Visit (INDEPENDENT_AMBULATORY_CARE_PROVIDER_SITE_OTHER): Admitting: Family Medicine

## 2024-01-11 ENCOUNTER — Encounter: Payer: Self-pay | Admitting: Family Medicine

## 2024-01-11 VITALS — BP 136/84 | HR 76 | Temp 97.3°F | Ht 62.0 in | Wt 270.0 lb

## 2024-01-11 DIAGNOSIS — E039 Hypothyroidism, unspecified: Secondary | ICD-10-CM

## 2024-01-11 DIAGNOSIS — Z13820 Encounter for screening for osteoporosis: Secondary | ICD-10-CM | POA: Diagnosis not present

## 2024-01-11 DIAGNOSIS — D509 Iron deficiency anemia, unspecified: Secondary | ICD-10-CM | POA: Diagnosis not present

## 2024-01-11 DIAGNOSIS — Z6841 Body Mass Index (BMI) 40.0 and over, adult: Secondary | ICD-10-CM

## 2024-01-11 DIAGNOSIS — Z780 Asymptomatic menopausal state: Secondary | ICD-10-CM

## 2024-01-11 DIAGNOSIS — Z1159 Encounter for screening for other viral diseases: Secondary | ICD-10-CM | POA: Diagnosis not present

## 2024-01-11 DIAGNOSIS — E66813 Obesity, class 3: Secondary | ICD-10-CM | POA: Diagnosis not present

## 2024-01-11 NOTE — Assessment & Plan Note (Signed)
 Recommend continue to work on eating healthy diet and exercise. Management per specialist. Bariatric surgery preparation in process.

## 2024-01-11 NOTE — Patient Instructions (Signed)
" °  VISIT SUMMARY: You came in for a follow-up on your thyroid  condition and anemia management. We discussed your thyroid  medication, anemia evaluation, nutritional intake, heel pain, ear symptoms, and mood. We also reviewed your general health maintenance needs.  YOUR PLAN: IRON DEFICIENCY ANEMIA: You have chronic iron deficiency anemia, possibly due to hidden gastrointestinal bleeding. -You have been referred to hematology for further evaluation and management. -We have ordered stool cards to check for hidden blood. -We have ordered blood work to assess your anemia status.  ACQUIRED HYPOTHYROIDISM: You have variable hormone production from residual thyroid  tissue after your thyroid  surgery. -We have ordered thyroid  function tests. -Continue your current thyroid  medication regimen. We will adjust based on your test results.  NUTRITIONAL INTAKE AND BARIATRIC MANAGEMENT: You are having difficulty consuming three meals per day and are considering protein shakes to meet your nutritional needs. -Continue to work with your bariatric surgeon to manage your nutritional intake.  HEEL PAIN AND PLANTAR FASCIITIS: You have heel pain, especially in the morning, which improves with walking. You have a history of plantar fasciitis and heel spurs. -Continue using specific footwear that provides symptom relief.  OTOLOGIC SYMPTOMS: You have a sensation of 'air' in your ears, especially when lying down. -Monitor your symptoms and let us  know if they worsen or change.  GENERAL HEALTH MAINTENANCE: You are due for routine screenings and vaccinations. -We have ordered a hepatitis C screening. -You should get a tetanus booster at the pharmacy.                      Contains text generated by [employer].                                 Contains text generated by [employer].   "

## 2024-01-11 NOTE — Assessment & Plan Note (Signed)
 Referral made.  Hemoccult cards given.   Orders:   Ambulatory referral to Hematology / Oncology

## 2024-01-11 NOTE — Progress Notes (Signed)
 "  Subjective:  Patient ID: Donna Mayer, female    DOB: 1955-06-14  Age: 68 y.o. MRN: [id number]  Chief Complaint  Patient presents with   Medical Management of Chronic Issues    HPI: Discussed the use of AI scribe software for clinical note transcription with the patient, who gave verbal consent to proceed.  History of Present Illness Donna Mayer is a 68 year old female with thyroid  issues and anemia who presents for follow-up on her thyroid  condition and anemia management.  Thyroid  dysfunction - History of thyroid  surgery. - Persistent palpable nodule ('little knot') in the thyroid  region; previously biopsied and found to be benign thyroid  tissue. - Difficulty regulating thyroid  medication; currently taking a reduced dose of 100 mcg. - Raspy voice present. - No sore throat, earache, or stuffy nose.  Anemia - History of iron deficiency anemia. - Referral to hematology for anemia evaluation. - Recent blood work performed and sent to hematologist; uncertain about follow-up. - Previous iron treatment received.  Nutritional intake and bariatric management - Under care of bariatric surgeon. - Difficulty consuming three meals per day. - Considering use of protein shakes to meet nutritional requirements.  Heel pain and plantar fasciitis - Heel pain, most pronounced in the morning and improves with walking. - History of plantar fasciitis and heel spurs. - Previous heel injection administered. - Symptom relief with specific footwear.  Otologic symptoms - Sensation of 'air' in the ears, especially when lying down.  Mood disorder - History of depression. - Mood currently well-managed.  Constitutional and cardiopulmonary symptoms - No fevers, chills, sweats, chest pain, or breathing problems.       01/11/2024    3:17 PM 11/24/2023    9:32 AM 07/07/2023    3:07 PM 02/05/2023    2:18 PM 12/25/2022    3:33 PM  Depression screen PHQ 2/9  Decreased Interest 2 0 0 0 2   Down, Depressed, Hopeless 0 0 0 0 0  PHQ - 2 Score 2 0 0 0 2  Altered sleeping 2  0 0 2  Tired, decreased energy 2  0 0 2  Change in appetite 3  0 2 0  Feeling bad or failure about yourself  0  0 0 0  Trouble concentrating 0  0 0 0  Moving slowly or fidgety/restless 0  0 0 0  Suicidal thoughts 0  0 0 0  PHQ-9 Score 9  0  2  6   Difficult doing work/chores Not difficult at all  Not difficult at all Not difficult at all Not difficult at all     Data saved with a previous flowsheet row definition        11/24/2023    9:32 AM  Fall Risk   Falls in the past year? 0    Patient Care Team: Sherre Clapper, MD as PCP - General (Family Medicine) Monetta Redell PARAS, MD as PCP - Cardiology (Cardiology) Hyacinth Warren PARAS, FNP (Endocrinology) Melanee Rosalynn Decent, MD (Inactive) as Referring Physician (Endocrinology) Carlie Clark, MD as Consulting Physician (Otolaryngology) Nyle Rankin POUR, [facility] (Inactive) (Pharmacist)   Review of Systems  Constitutional:  Negative for chills, fatigue and fever.  HENT:  Negative for congestion, ear pain and sore throat.   Respiratory:  Negative for cough and shortness of breath.   Cardiovascular:  Negative for chest pain.  Gastrointestinal:  Negative for abdominal pain, constipation, diarrhea, nausea and vomiting.  Genitourinary:  Negative for dysuria and urgency.  Musculoskeletal:  Positive  for arthralgias (knees). Negative for myalgias.  Skin:  Negative for rash.  Neurological:  Negative for dizziness and headaches.  Psychiatric/Behavioral:  Positive for dysphoric mood.     Medications Ordered Prior to Encounter[1] Past Medical History:  Diagnosis Date   Abnormal EKG 05/27/2023   Acute CHF (congestive heart failure) (HCC) 11/16/2019   Anemia 11/16/2019   At risk for loss of bone density 05/27/2023   Cardiac murmur    CHF (congestive heart failure) (HCC)    Chronic idiopathic constipation 07/12/2023   Chronic pain    Chronic pain of left knee  05/27/2023   Chronic systolic heart failure (HCC)    Class 3 severe obesity due to excess calories with serious comorbidity and body mass index (BMI) of 45.0 to 49.9 in adult (HCC) 06/13/2022   Depression, major, recurrent, mild 05/24/2019   Ectopic thyroid  tissue 10/05/2023   Eczema 02/10/2022   Elevated troponin 11/16/2019   Encounter for immunization 12/28/2022   Encounter for mammogram to establish baseline mammogram 02/20/2021   Encounter for osteoporosis screening in asymptomatic postmenopausal patient 12/28/2022   Essential hypertension, benign 05/13/2019   Foot pain, left 09/27/2022   GERD (gastroesophageal reflux disease)    Graves disease 05/24/2019   Graves disease 05/24/2019   Graves' disease with exophthalmos 10/06/2019   Hypertensive urgency 11/16/2019   Hyperthyroidism 05/13/2019   Hyperthyroidism 05/13/2019   IDA (iron deficiency anemia)    Impaired fasting glucose 12/28/2022   Influenza A 02/12/2023   Left thyroid  nodule 10/06/2019   Lumbar back pain 10/09/2021   LV dysfunction 07/02/2023   Mild vitamin D  deficiency 06/13/2022   Mitral regurgitation 07/02/2023   Mixed hyperlipidemia    Moderate recurrent major depression (HCC) 10/09/2021   Muscle spasm 02/10/2022   Need for influenza vaccination 10/09/2021   Non-intractable vomiting 05/24/2019   Obesity, Class III, BMI 40-49.9 (morbid obesity) (HCC) 06/06/2020   Other insomnia    Palpitations 05/19/2019   Plantar fasciitis of right foot 10/07/2022   Post-surgical hypothyroidism 01/16/2020   Prediabetes 02/20/2021   Primary insomnia    Severe episode of recurrent major depressive disorder, without psychotic features (HCC) 05/24/2019   Shortness of breath 05/27/2023   Thoracic aortic aneurysm without rupture 08/02/2020   Unexplained weight gain 12/28/2022   Vitamin D  insufficiency 05/27/2023   Weight loss 05/24/2019   Past Surgical History:  Procedure Laterality Date   BACK SURGERY     Dr Duwayne    CARPAL TUNNEL RELEASE Right    CESAREAN SECTION W/BTL     COLONOSCOPY  03/30/2015   Internal hoids. Mild diverticulosis. Otherwise normal colonocsopy to TI.   ESOPHAGOGASTRODUODENOSCOPY  03/30/2015   Mild gastritis. Small hiatal hernia   Lap band surgery  2010   [place]   SKIN GRAFT     TEE WITHOUT CARDIOVERSION N/A 11/21/2019   Procedure: TRANSESOPHAGEAL ECHOCARDIOGRAM (TEE);  Surgeon: Okey Vina GAILS, MD;  Location: [facility] ENDOSCOPY;  Service: Cardiovascular;  Laterality: N/A;   THYROIDECTOMY N/A 12/07/2019   Procedure: TOTAL THYROIDECTOMY;  Surgeon: Carlie Clark, MD;  Location: [facility] OR;  Service: ENT;  Laterality: N/A;    Family History  Problem Relation Age of Onset   Diabetes Mother    Stroke Mother    Heart attack Father    Diabetes Sister    Sarcoidosis Sister    Colon cancer Neg Hx    Esophageal cancer Neg Hx    Rectal cancer Neg Hx    Stomach cancer Neg Hx    Breast cancer  Neg Hx    Social History   Socioeconomic History   Marital status: Divorced    Spouse name: Not on file   Number of children: 2   Years of education: Not on file   Highest education level: Not on file  Occupational History   Occupation: disabled  Tobacco Use   Smoking status: Never   Smokeless tobacco: Never  Vaping Use   Vaping status: Never Used  Substance and Sexual Activity   Alcohol use: Never   Drug use: Never   Sexual activity: Not Currently  Other Topics Concern   Not on file  Social History Narrative   Not on file   Social Drivers of Health   Tobacco Use: Low Risk (01/11/2024)   Patient History    Smoking Tobacco Use: Never    Smokeless Tobacco Use: Never    Passive Exposure: Not on file  Financial Resource Strain: Low Risk (12/25/2022)   Overall Financial Resource Strain (CARDIA)    Difficulty of Paying Living Expenses: Not hard at all  Food Insecurity: Low Risk (12/17/2023)   Received from [facility]   Epic    Within the past 12 months, you worried that your food  would run out before you got money to buy more: Never true    Within the past 12 months, the food you bought just didn't last and you didn't have money to get more. : Never true  Transportation Needs: No Transportation Needs (12/17/2023)   Received from [facility]    In the past 12 months, has lack of reliable transportation kept you from medical appointments, meetings, work or from getting things needed for daily living? : No  Physical Activity: Insufficiently Active (02/05/2023)   Exercise Vital Sign    Days of Exercise per Week: 7 days    Minutes of Exercise per Session: 20 min  Stress: No Stress Concern Present (12/25/2022)   [employer] of Occupational Health - Occupational Stress Questionnaire    Feeling of Stress : Not at all  Social Connections: Moderately Isolated (12/25/2022)   Social Connection and Isolation Panel    Frequency of Communication with Friends and Family: More than three times a week    Frequency of Social Gatherings with Friends and Family: More than three times a week    Attends Religious Services: More than 4 times per year    Active Member of Clubs or Organizations: No    Attends Banker Meetings: Never    Marital Status: Divorced  Depression (PHQ2-9): Medium Risk (01/11/2024)   Depression (PHQ2-9)    PHQ-2 Score: 9  Alcohol Screen: Low Risk (07/18/2021)   Alcohol Screen    Last Alcohol Screening Score (AUDIT): 0  Housing: Medium Risk (12/17/2023)   Received from [facility]   Epic    What is your living situation today?: I have a place to live today, but I am worried about losing it in the future    Think about the place you live. Do you have problems with any of the following? Choose all that apply:: None/None on this list  Utilities: Low Risk (12/17/2023)   Received from [facility]   Utilities    In the past 12 months has the electric, gas, oil, or water  company threatened to shut off services in your home? : No   Health Literacy: Adequate Health Literacy (12/25/2022)   B1300 Health Literacy    Frequency of need for help with medical instructions: Never  Objective:  BP 136/84   Pulse 76   Temp (!) 97.3 F (36.3 C)   Ht 5' 2 (1.575 m)   Wt 270 lb (122.5 kg)   LMP 04/14/2010   SpO2 97%   BMI 49.38 kg/m      01/11/2024    2:36 PM 12/15/2023    1:55 PM 11/24/2023    9:10 AM  BP/Weight  Systolic BP 136    Diastolic BP 84    Wt. (Lbs) 270 265.4 261.6  BMI 49.38 kg/m2 48.54 kg/m2 47.85 kg/m2    Physical Exam Vitals reviewed.  Constitutional:      Appearance: Normal appearance. She is normal weight.  HENT:     Right Ear: Tympanic membrane normal.     Left Ear: Tympanic membrane normal.  Neck:     Vascular: No carotid bruit.  Cardiovascular:     Rate and Rhythm: Normal rate and regular rhythm.     Heart sounds: Normal heart sounds.  Pulmonary:     Effort: Pulmonary effort is normal. No respiratory distress.     Breath sounds: Normal breath sounds.  Abdominal:     General: Abdomen is flat. Bowel sounds are normal.     Palpations: Abdomen is soft.     Tenderness: There is no abdominal tenderness.  Neurological:     Mental Status: She is alert and oriented to person, place, and time.  Psychiatric:        Mood and Affect: Mood normal.        Behavior: Behavior normal.         Lab Results  Component Value Date   WBC 5.6 12/14/2023   HGB 11.0 (L) 12/14/2023   HCT 35.0 12/14/2023   PLT 251 12/14/2023   GLUCOSE 70 12/14/2023   CHOL 215 (H) 05/27/2023   TRIG 53 05/27/2023   HDL 84 05/27/2023   LDLCALC 122 (H) 05/27/2023   ALT 12 12/14/2023   AST 23 12/14/2023   NA 139 12/14/2023   K 3.9 12/14/2023   CL 100 12/14/2023   CREATININE 0.80 12/14/2023   BUN 14 12/14/2023   CO2 26 12/14/2023   TSH 2.730 08/07/2023   INR 1.4 (H) 11/16/2019   HGBA1C 5.4 12/14/2023    Results for orders placed or performed in visit on 12/14/23  VITAMIN D  25 Hydroxy (Vit-D Deficiency,  Fractures)   Collection Time: 12/14/23  1:58 PM  Result Value Ref Range   Vit D, 25-Hydroxy 53.6 30.0 - 100.0 ng/mL  Hemoglobin A1c   Collection Time: 12/14/23  1:58 PM  Result Value Ref Range   Hgb A1c MFr Bld 5.4 4.8 - 5.6 %   Est. average glucose Bld gHb Est-mCnc 108 mg/dL  Vitamin A87   Collection Time: 12/14/23  1:58 PM  Result Value Ref Range   Vitamin B-12 280 232 - 1,245 pg/mL  CBC with Differential/Platelet   Collection Time: 12/14/23  1:58 PM  Result Value Ref Range   WBC 5.6 3.4 - 10.8 x10E3/uL   RBC 4.24 3.77 - 5.28 x10E6/uL   Hemoglobin 11.0 (L) 11.1 - 15.9 g/dL   Hematocrit 64.9 65.9 - 46.6 %   MCV 83 79 - 97 fL   MCH 25.9 (L) 26.6 - 33.0 pg   MCHC 31.4 (L) 31.5 - 35.7 g/dL   RDW 85.6 88.2 - 84.5 %   Platelets 251 150 - 450 x10E3/uL   Neutrophils 66 Not Estab. %   Lymphs 23 Not Estab. %   Monocytes 9  Not Estab. %   Eos 2 Not Estab. %   Basos 0 Not Estab. %   Neutrophils Absolute 3.6 1.4 - 7.0 x10E3/uL   Lymphocytes Absolute 1.3 0.7 - 3.1 x10E3/uL   Monocytes Absolute 0.5 0.1 - 0.9 x10E3/uL   EOS (ABSOLUTE) 0.1 0.0 - 0.4 x10E3/uL   Basophils Absolute 0.0 0.0 - 0.2 x10E3/uL   Immature Granulocytes 0 Not Estab. %   Immature Grans (Abs) 0.0 0.0 - 0.1 x10E3/uL  Comprehensive metabolic panel with GFR   Collection Time: 12/14/23  1:58 PM  Result Value Ref Range   Glucose 70 70 - 99 mg/dL   BUN 14 8 - 27 mg/dL   Creatinine, Ser 9.19 0.57 - 1.00 mg/dL   eGFR 81 >40 fO/fpw/8.26   BUN/Creatinine Ratio 18 12 - 28   Sodium 139 134 - 144 mmol/L   Potassium 3.9 3.5 - 5.2 mmol/L   Chloride 100 96 - 106 mmol/L   CO2 26 20 - 29 mmol/L   Calcium  9.2 8.7 - 10.3 mg/dL   Total Protein 7.2 6.0 - 8.5 g/dL   Albumin 4.3 3.9 - 4.9 g/dL   Globulin, Total 2.9 1.5 - 4.5 g/dL   Bilirubin Total 0.5 0.0 - 1.2 mg/dL   Alkaline Phosphatase 103 49 - 135 IU/L   AST 23 0 - 40 IU/L   ALT 12 0 - 32 IU/L  Iron, TIBC and Ferritin Panel   Collection Time: 12/14/23  1:58 PM  Result  Value Ref Range   Total Iron Binding Capacity 391 250 - 450 ug/dL   UIBC 668 881 - 630 ug/dL   Iron 60 27 - 860 ug/dL   Iron Saturation 15 15 - 55 %   Ferritin 20 15 - 150 ng/mL  .  Assessment & Plan:   Assessment & Plan Acquired hypothyroidism Previously well controlled Continue Synthroid  at current dose  Recheck TSH and adjust Synthroid  as indicated   Orders:   T4, free   TSH  Iron deficiency anemia, unspecified iron deficiency anemia type Referral made.  Hemoccult cards given.   Orders:   Ambulatory referral to Hematology / Oncology  Encounter for osteoporosis screening in asymptomatic postmenopausal patient Check bone density  Orders:   DG BONE DENSITY (DXA); Future  Need for hepatitis C screening test Check lab Orders:   HCV Ab w Reflex to Quant PCR  Class 3 severe obesity due to excess calories with serious comorbidity and body mass index (BMI) of 45.0 to 49.9 in adult [facility]) Recommend continue to work on eating healthy diet and exercise. Management per specialist. Bariatric surgery preparation in process.      Due for routine screenings and vaccinations. - Ordered hepatitis C screening. - Advised tetanus booster at pharmacy  Body mass index is 49.38 kg/m.   No orders of the defined types were placed in this encounter.   Orders Placed This Encounter  Procedures   DG BONE DENSITY (DXA)   T4, free   TSH   HCV Ab w Reflex to Quant PCR   Ambulatory referral to Hematology / Oncology       Follow-up: Return in about 3 months (around 04/10/2024) for chronic follow up.  An After Visit Summary was printed and given to the patient.  Abigail Free, MD [facility] [phone]     [1]  Current Outpatient Medications on File Prior to Visit  Medication Sig Dispense Refill   atorvastatin  (LIPITOR) 80 MG tablet TAKE 1 TABLET BY MOUTH DAILY  AT  BEDTIME 100 tablet 2   escitalopram  (LEXAPRO ) 10 MG tablet Take 1 tablet (10 mg total) by mouth  daily. 90 tablet 1   esomeprazole  (NEXIUM ) 40 MG capsule Take 1 capsule (40 mg total) by mouth daily at 12 noon. 90 capsule 3   levothyroxine  (SYNTHROID ) 100 MCG tablet Take 1 tablet (100 mcg total) by mouth daily. 90 tablet 3   Polyethyl Glycol-Propyl Glycol (SYSTANE) 0.4-0.3 % SOLN Apply 1 drop to eye 4 (four) times daily as needed. In both eyes. 30 mL 3   valsartan -hydrochlorothiazide  (DIOVAN -HCT) 160-12.5 MG tablet Take 1 tablet by mouth daily.     Vitamin D , Ergocalciferol , (DRISDOL ) 1.25 MG (50000 UNIT) CAPS capsule TAKE ONE CAPSULE BY MOUTH weekly on monday, wednesday, and friday 39 capsule 3   zolpidem  (AMBIEN  CR) 12.5 MG CR tablet TAKE 1 TABLET BY MOUTH AT BEDTIME AS NEEDED 30 tablet 5   No current facility-administered medications on file prior to visit.   "

## 2024-01-11 NOTE — Assessment & Plan Note (Signed)
 Check bone density  Orders:   DG BONE DENSITY (DXA); Future

## 2024-01-12 LAB — T4, FREE: Free T4: 1.59 ng/dL (ref 0.82–1.77)

## 2024-01-12 LAB — HCV INTERPRETATION

## 2024-01-12 LAB — TSH: TSH: 5.3 u[IU]/mL — ABNORMAL HIGH (ref 0.450–4.500)

## 2024-01-12 LAB — HCV AB W REFLEX TO QUANT PCR: HCV Ab: NONREACTIVE

## 2024-01-15 ENCOUNTER — Ambulatory Visit (INDEPENDENT_AMBULATORY_CARE_PROVIDER_SITE_OTHER)
Admission: RE | Admit: 2024-01-15 | Discharge: 2024-01-15 | Disposition: A | Discharge status: Home / Self Care | Source: Ambulatory Visit | Attending: Family Medicine | Admitting: Family Medicine

## 2024-01-15 DIAGNOSIS — Z780 Asymptomatic menopausal state: Secondary | ICD-10-CM

## 2024-01-15 DIAGNOSIS — Z13820 Encounter for screening for osteoporosis: Secondary | ICD-10-CM

## 2024-01-17 ENCOUNTER — Ambulatory Visit: Payer: Self-pay | Admitting: Family Medicine

## 2024-01-17 DIAGNOSIS — E039 Hypothyroidism, unspecified: Secondary | ICD-10-CM

## 2024-01-18 ENCOUNTER — Encounter: Payer: Self-pay | Admitting: Dietician

## 2024-01-18 ENCOUNTER — Encounter: Attending: General Surgery | Admitting: Dietician

## 2024-01-18 ENCOUNTER — Other Ambulatory Visit: Payer: Self-pay | Admitting: Family Medicine

## 2024-01-18 VITALS — Ht 62.0 in | Wt 267.8 lb

## 2024-01-18 DIAGNOSIS — Z1231 Encounter for screening mammogram for malignant neoplasm of breast: Secondary | ICD-10-CM

## 2024-01-18 DIAGNOSIS — E669 Obesity, unspecified: Secondary | ICD-10-CM | POA: Insufficient documentation

## 2024-01-18 MED ORDER — LEVOTHYROXINE SODIUM 88 MCG PO TABS: 88.0000 ug | ORAL_TABLET | Freq: Every day | ORAL | 3 refills | Status: AC

## 2024-01-18 NOTE — Progress Notes (Signed)
 Supervised Weight Loss Visit Bariatric Nutrition Education Appt Start Time: 1358    End Time: 1442  Planned surgery: Conversion from Gastric Band to Gastric By-Pass Pt expectation of surgery: Wt loss  Referral stated Supervised Weight Loss (SWL) visits needed: 6 Month  2 out of 6 SWL Appointments   NUTRITION ASSESSMENT   Anthropometrics  Start weight at [facility]: 261.6 lbs (date: 11/24/2023)  Height: 62 in Weight today: 267.8 lbs BMI: 48.98 kg/m2     Clinical   Pharmacotherapy: History of weight loss medication used: Ozempic , Phentermine Pt reports previous nutrition counseling at a wt loss clinic.   Medical hx: GERD, Thyroid  Disease, Hyperlipidemia, HTN Medications: Nexium , Levothyroxine , Lipitor, Valsartan -Hydrochlorothiazide , Ambien  PRN Labs: no recent labs in EMR (Planning to complete for pathway) Notable signs/symptoms: none noted Any previous deficiencies? No  Lifestyle & Dietary Hx  Pt states she is drinking 2 protein shakes per day to get to her protein goal. Pt states she is agreeable to cut back on the protein shakes, and aim for more protein from food. Pt states the [employer] pool heater went out, stating she has not been swimming. Pt states she is drinking 64 oz of water .  Estimated daily fluid intake: 64 oz Supplements: vit D3, magnesium , iron Current average weekly physical activity: walking at the [facility] daily (when they are not playing basketball). Thirteen laps is a mile, 5 days per week. Pt states she also uses a vibration plate 3 days per week (mostly weekends).  24-Hr Dietary Recall First Meal: two boiled eggs or oatmeal (sometimes with blueberries) or grits Snack:  Second Meal: salad with grilled chicken Snack:  Third Meal: orange, carrots, celery  Snack:  Beverages: Water , Mushroom Coffee, grapefruit juice sometimes  Pt reports drinking 64oz water  daily.   Alcoholic beverages per week: 0   Estimated Energy Needs Calories: 1500  NUTRITION DIAGNOSIS   Overweight/obesity (-3.3) related to past poor dietary habits and physical inactivity as evidenced by patient w/ planned conversion from gastric band to gastric by-pass surgery following dietary guidelines for continued weight loss.  NUTRITION INTERVENTION  Nutrition counseling (C-1) and education (E-2) to facilitate bariatric surgery goals.  Aim for about 60 grams of protein per day and include a protein source with every meal or snack to keep your energy steady and support muscle mass. Try to eat your first meal within 1 to 1 hours of waking to set a solid rhythm for the day. For easy tracking, remember that each ounce of cooked meat has about 7 grams of protein, and a 3-ounce portion--roughly the size of your palm or a deck of cards--gives you around 21 grams. Using a food scale and weighing meat after cooking helps you stay accurate and consistent as you build these habits. Use nutrition facts on food labels to track protein.  Pre-Op Goals Progress & New Goals Continue: Increase protein intake (eggs, fish, chicken, yogurt) before surgery. Aim for protein at every meal &/or snack; aim for protein from food and not supplements, like protein shakes New: track protein; aim for 60 grams per day Continue: Aim for 3 meals per day; aim for satisfaction before fullness; slow down at mealtime.  Handouts Provided Include  Bariatric MyPlate (second copy)  Learning Style & Readiness for Change Teaching method utilized: Visual & Auditory  Demonstrated degree of understanding via: Teach Back  Readiness Level: preparation Barriers to learning/adherence to lifestyle change: nothing identified  RD's Notes for next Visit  Patient progress toward chosen goals  MONITORING & EVALUATION  Dietary intake, weekly physical activity, body weight, and pre-op goals.  Next Steps  Patient is to return to [facility] in 1 month for next SWL visit.

## 2024-01-19 ENCOUNTER — Inpatient Hospital Stay

## 2024-01-19 ENCOUNTER — Other Ambulatory Visit: Payer: Self-pay

## 2024-01-19 ENCOUNTER — Telehealth: Payer: Self-pay | Admitting: Hematology and Oncology

## 2024-01-19 ENCOUNTER — Encounter: Payer: Self-pay | Admitting: Hematology and Oncology

## 2024-01-19 ENCOUNTER — Inpatient Hospital Stay: Attending: Hematology and Oncology | Admitting: Hematology and Oncology

## 2024-01-19 DIAGNOSIS — D649 Anemia, unspecified: Secondary | ICD-10-CM

## 2024-01-19 DIAGNOSIS — D509 Iron deficiency anemia, unspecified: Secondary | ICD-10-CM | POA: Diagnosis not present

## 2024-01-19 LAB — CMP (CANCER CENTER ONLY)
ALT: 18 U/L (ref 0–44)
AST: 30 U/L (ref 15–41)
Albumin: 4.3 g/dL (ref 3.5–5.0)
Alkaline Phosphatase: 102 U/L (ref 38–126)
Anion gap: 11 (ref 5–15)
BUN: 22 mg/dL (ref 8–23)
CO2: 27 mmol/L (ref 22–32)
Calcium: 9.8 mg/dL (ref 8.9–10.3)
Chloride: 101 mmol/L (ref 98–111)
Creatinine: 0.71 mg/dL (ref 0.44–1.00)
GFR, Estimated: >60 mL/min
Glucose, Bld: 86 mg/dL (ref 70–99)
Potassium: 3.7 mmol/L (ref 3.5–5.1)
Sodium: 138 mmol/L (ref 135–145)
Total Bilirubin: 0.5 mg/dL (ref 0.0–1.2)
Total Protein: 7.8 g/dL (ref 6.5–8.1)

## 2024-01-19 LAB — IRON AND TIBC
Iron: 38 ug/dL (ref 28–170)
Saturation Ratios: 8 % — ABNORMAL LOW (ref 10.4–31.8)
TIBC: 470 ug/dL — ABNORMAL HIGH (ref 250–450)
UIBC: 432 ug/dL

## 2024-01-19 LAB — CBC WITH DIFFERENTIAL (CANCER CENTER ONLY)
Abs Immature Granulocytes: 0.01 K/uL (ref 0.00–0.07)
Basophils Absolute: 0 K/uL (ref 0.0–0.1)
Basophils Relative: 0 %
Eosinophils Absolute: 0.1 K/uL (ref 0.0–0.5)
Eosinophils Relative: 2 %
HCT: 33.1 % — ABNORMAL LOW (ref 36.0–46.0)
Hemoglobin: 10.6 g/dL — ABNORMAL LOW (ref 12.0–15.0)
Immature Granulocytes: 0 %
Lymphocytes Relative: 23 %
Lymphs Abs: 1.2 K/uL (ref 0.7–4.0)
MCH: 25.5 pg — ABNORMAL LOW (ref 26.0–34.0)
MCHC: 32 g/dL (ref 30.0–36.0)
MCV: 79.6 fL — ABNORMAL LOW (ref 80.0–100.0)
Monocytes Absolute: 0.4 K/uL (ref 0.1–1.0)
Monocytes Relative: 8 %
Neutro Abs: 3.5 K/uL (ref 1.7–7.7)
Neutrophils Relative %: 67 %
Platelet Count: 245 K/uL (ref 150–400)
RBC: 4.16 MIL/uL (ref 3.87–5.11)
RDW: 15.6 % — ABNORMAL HIGH (ref 11.5–15.5)
WBC Count: 5.2 K/uL (ref 4.0–10.5)
nRBC: 0 % (ref 0.0–0.2)

## 2024-01-19 LAB — FERRITIN: Ferritin: 22 ng/mL (ref 11–307)

## 2024-01-19 LAB — TSH: TSH: 5.76 u[IU]/mL — ABNORMAL HIGH (ref 0.350–4.500)

## 2024-01-19 LAB — FOLATE: Folate: 14.6 ng/mL

## 2024-01-19 LAB — VITAMIN B12: Vitamin B-12: 237 pg/mL (ref 180–914)

## 2024-01-19 NOTE — Telephone Encounter (Signed)
 Patient has been scheduled for follow-up visit per 01/19/2024 LOS.  Pt aware of scheduled appt details.

## 2024-01-19 NOTE — Progress Notes (Unsigned)
 [facility] CONSULT NOTE  Patient Care Team: Sherre Clapper, MD as PCP - General (Family Medicine) Monetta Redell PARAS, MD as PCP - Cardiology (Cardiology) Hyacinth Warren PARAS, FNP (Endocrinology) Melanee Rosalynn Decent, MD (Inactive) as Referring Physician (Endocrinology) Carlie Clark, MD as Consulting Physician (Otolaryngology) Nyle Rankin POUR, [facility] (Inactive) (Pharmacist)  ASSESSMENT & PLAN:  No problem-specific Assessment & Plan notes found for this encounter.  No orders of the defined types were placed in this encounter.   All questions were answered. The patient knows to call the clinic with any problems, questions or concerns.  The total time spent in the appointment was {CHL ONC TIME VISIT - DTPQU:[id number]} encounter with patients including review of chart and various tests results, discussions about plan of care and coordination of care plan  Donna DELENA Bach, NP 1/6/20264:41 PM   CHIEF COMPLAINTS/PURPOSE OF CONSULTATION:  Anemia  HISTORY OF PRESENTING ILLNESS:  Donna Mayer 69 y.o. female is here because of anemia  ***She was found to have abnormal CBC from *** ***She denies recent chest pain on exertion, shortness of breath on minimal exertion, pre-syncopal episodes, or palpitations. ***She had not noticed any recent bleeding such as epistaxis, hematuria or hematochezia ***The patient denies over the counter NSAID ingestion. She is not *** on antiplatelets agents. Her last colonoscopy was *** ***She had no prior history or diagnosis of cancer. Her age appropriate screening programs are up-to-date. ***She denies any pica and eats a variety of diet. ***She never donated blood or received blood transfusion ***The patient was prescribed oral iron supplements and she takes ***  MEDICAL HISTORY:  Past Medical History:  Diagnosis Date   Abnormal EKG 05/27/2023   Acute CHF (congestive heart failure) (HCC) 11/16/2019   Anemia 11/16/2019   At risk for loss of  bone density 05/27/2023   Cardiac murmur    CHF (congestive heart failure) (HCC)    Chronic idiopathic constipation 07/12/2023   Chronic pain    Chronic pain of left knee 05/27/2023   Chronic systolic heart failure (HCC)    Class 3 severe obesity due to excess calories with serious comorbidity and body mass index (BMI) of 45.0 to 49.9 in adult (HCC) 06/13/2022   Depression, major, recurrent, mild 05/24/2019   Ectopic thyroid  tissue 10/05/2023   Eczema 02/10/2022   Elevated troponin 11/16/2019   Encounter for immunization 12/28/2022   Encounter for mammogram to establish baseline mammogram 02/20/2021   Encounter for osteoporosis screening in asymptomatic postmenopausal patient 12/28/2022   Essential hypertension, benign 05/13/2019   Foot pain, left 09/27/2022   GERD (gastroesophageal reflux disease)    Graves disease 05/24/2019   Graves disease 05/24/2019   Graves' disease with exophthalmos 10/06/2019   Hypertensive urgency 11/16/2019   Hyperthyroidism 05/13/2019   Hyperthyroidism 05/13/2019   IDA (iron deficiency anemia)    Impaired fasting glucose 12/28/2022   Influenza A 02/12/2023   Left thyroid  nodule 10/06/2019   Lumbar back pain 10/09/2021   LV dysfunction 07/02/2023   Mild vitamin D  deficiency 06/13/2022   Mitral regurgitation 07/02/2023   Mixed hyperlipidemia    Moderate recurrent major depression (HCC) 10/09/2021   Muscle spasm 02/10/2022   Need for influenza vaccination 10/09/2021   Non-intractable vomiting 05/24/2019   Obesity, Class III, BMI 40-49.9 (morbid obesity) (HCC) 06/06/2020   Other insomnia    Palpitations 05/19/2019   Plantar fasciitis of right foot 10/07/2022   Post-surgical hypothyroidism 01/16/2020   Prediabetes 02/20/2021   Primary insomnia    Severe episode  of recurrent major depressive disorder, without psychotic features (HCC) 05/24/2019   Shortness of breath 05/27/2023   Thoracic aortic aneurysm without rupture 08/02/2020   Unexplained  weight gain 12/28/2022   Vitamin D  insufficiency 05/27/2023   Weight loss 05/24/2019    SURGICAL HISTORY: Past Surgical History:  Procedure Laterality Date   BACK SURGERY     Dr Duwayne   CARPAL TUNNEL RELEASE Right    CESAREAN SECTION W/BTL     COLONOSCOPY  03/30/2015   Internal hoids. Mild diverticulosis. Otherwise normal colonocsopy to TI.   ESOPHAGOGASTRODUODENOSCOPY  03/30/2015   Mild gastritis. Small hiatal hernia   Lap band surgery  2010   [place]   SKIN GRAFT     TEE WITHOUT CARDIOVERSION N/A 11/21/2019   Procedure: TRANSESOPHAGEAL ECHOCARDIOGRAM (TEE);  Surgeon: Okey Vina GAILS, MD;  Location: [facility] ENDOSCOPY;  Service: Cardiovascular;  Laterality: N/A;   THYROIDECTOMY N/A 12/07/2019   Procedure: TOTAL THYROIDECTOMY;  Surgeon: Carlie Clark, MD;  Location: [facility] OR;  Service: ENT;  Laterality: N/A;    SOCIAL HISTORY: Social History   Socioeconomic History   Marital status: Divorced    Spouse name: Not on file   Number of children: 2   Years of education: Not on file   Highest education level: Not on file  Occupational History   Occupation: disabled  Tobacco Use   Smoking status: Never   Smokeless tobacco: Never  Vaping Use   Vaping status: Never Used  Substance and Sexual Activity   Alcohol use: Never   Drug use: Never   Sexual activity: Not Currently  Other Topics Concern   Not on file  Social History Narrative   Not on file   Social Drivers of Health   Tobacco Use: Low Risk (01/19/2024)   Patient History    Smoking Tobacco Use: Never    Smokeless Tobacco Use: Never    Passive Exposure: Not on file  Financial Resource Strain: Low Risk (12/25/2022)   Overall Financial Resource Strain (CARDIA)    Difficulty of Paying Living Expenses: Not hard at all  Food Insecurity: Low Risk (12/17/2023)   Received from [facility]   Epic    Within the past 12 months, you worried that your food would run out before you got money to buy more: Never true    Within the  past 12 months, the food you bought just didn't last and you didn't have money to get more. : Never true  Transportation Needs: No Transportation Needs (12/17/2023)   Received from [facility]    In the past 12 months, has lack of reliable transportation kept you from medical appointments, meetings, work or from getting things needed for daily living? : No  Physical Activity: Insufficiently Active (02/05/2023)   Exercise Vital Sign    Days of Exercise per Week: 7 days    Minutes of Exercise per Session: 20 min  Stress: No Stress Concern Present (12/25/2022)   [employer] of Occupational Health - Occupational Stress Questionnaire    Feeling of Stress : Not at all  Social Connections: Moderately Isolated (12/25/2022)   Social Connection and Isolation Panel    Frequency of Communication with Friends and Family: More than three times a week    Frequency of Social Gatherings with Friends and Family: More than three times a week    Attends Religious Services: More than 4 times per year    Active Member of [employer] or Organizations: No    Attends [employer]  or Organization Meetings: Never    Marital Status: Divorced  Catering Manager Violence: Not At Risk (07/07/2023)   Epic    Fear of Current or Ex-Partner: No    Emotionally Abused: No    Physically Abused: No    Sexually Abused: No  Depression (PHQ2-9): Medium Risk (01/11/2024)   Depression (PHQ2-9)    PHQ-2 Score: 9  Alcohol Screen: Low Risk (07/18/2021)   Alcohol Screen    Last Alcohol Screening Score (AUDIT): 0  Housing: Medium Risk (12/17/2023)   Received from [facility]   Epic    What is your living situation today?: I have a place to live today, but I am worried about losing it in the future    Think about the place you live. Do you have problems with any of the following? Choose all that apply:: None/None on this list  Utilities: Low Risk (12/17/2023)   Received from [facility]   Utilities    In the past 12  months has the electric, gas, oil, or water  company threatened to shut off services in your home? : No  Health Literacy: Adequate Health Literacy (12/25/2022)   B1300 Health Literacy    Frequency of need for help with medical instructions: Never    FAMILY HISTORY: Family History  Problem Relation Age of Onset   Diabetes Mother    Stroke Mother    Heart attack Father    Diabetes Sister    Sarcoidosis Sister    Colon cancer Neg Hx    Esophageal cancer Neg Hx    Rectal cancer Neg Hx    Stomach cancer Neg Hx    Breast cancer Neg Hx     ALLERGIES:  has no known allergies.  MEDICATIONS:  Current Outpatient Medications  Medication Sig Dispense Refill   atorvastatin  (LIPITOR) 80 MG tablet TAKE 1 TABLET BY MOUTH DAILY AT  BEDTIME 100 tablet 2   escitalopram  (LEXAPRO ) 10 MG tablet Take 1 tablet (10 mg total) by mouth daily. 90 tablet 1   esomeprazole  (NEXIUM ) 40 MG capsule Take 1 capsule (40 mg total) by mouth daily at 12 noon. 90 capsule 3   levothyroxine  (SYNTHROID ) 88 MCG tablet Take 1 tablet (88 mcg total) by mouth daily. 90 tablet 3   Polyethyl Glycol-Propyl Glycol (SYSTANE) 0.4-0.3 % SOLN Apply 1 drop to eye 4 (four) times daily as needed. In both eyes. 30 mL 3   valsartan -hydrochlorothiazide  (DIOVAN -HCT) 160-12.5 MG tablet Take 1 tablet by mouth daily.     Vitamin D , Ergocalciferol , (DRISDOL ) 1.25 MG (50000 UNIT) CAPS capsule TAKE ONE CAPSULE BY MOUTH weekly on monday, wednesday, and friday 39 capsule 3   zolpidem  (AMBIEN  CR) 12.5 MG CR tablet TAKE 1 TABLET BY MOUTH AT BEDTIME AS NEEDED 30 tablet 5   No current facility-administered medications for this visit.    REVIEW OF SYSTEMS:   Constitutional: Denies fevers, chills or abnormal night sweats Eyes: Denies blurriness of vision, double vision or watery eyes Ears, nose, mouth, throat, and face: Denies mucositis or sore throat Respiratory: Denies cough, dyspnea or wheezes Cardiovascular: Denies palpitation, chest discomfort or  lower extremity swelling Gastrointestinal:  Denies nausea, heartburn or change in bowel habits Skin: Denies abnormal skin rashes Lymphatics: Denies new lymphadenopathy or easy bruising Neurological:Denies numbness, tingling or new weaknesses Behavioral/Psych: Mood is stable, no new changes  All other systems were reviewed with the patient and are negative.  PHYSICAL EXAMINATION: ECOG PERFORMANCE STATUS: {CHL ONC ECOG PS:[id number]}  There were no vitals filed  for this visit. There were no vitals filed for this visit.  GENERAL:alert, no distress and comfortable SKIN: skin color, texture, turgor are normal, no rashes or significant lesions EYES: normal, conjunctiva are pink and non-injected, sclera clear OROPHARYNX:no exudate, no erythema and lips, buccal mucosa, and tongue normal  NECK: supple, thyroid  normal size, non-tender, without nodularity LYMPH:  no palpable lymphadenopathy in the cervical, axillary or inguinal LUNGS: clear to auscultation and percussion with normal breathing effort HEART: regular rate & rhythm and no murmurs and no lower extremity edema ABDOMEN:abdomen soft, non-tender and normal bowel sounds Musculoskeletal:no cyanosis of digits and no clubbing  PSYCH: alert & oriented x 3 with fluent speech NEURO: no focal motor/sensory deficits  RADIOGRAPHIC STUDIES: I have personally reviewed the radiological images as listed and agreed with the findings in the report. DG BONE DENSITY (DXA) Result Date: 01/18/2024 EXAM: DUAL X-RAY ABSORPTIOMETRY (DXA) FOR BONE MINERAL DENSITY 01/15/2024 3:17 pm CLINICAL DATA:  69 year old Female Postmenopausal. Osteoporosis screening TECHNIQUE: An axial (e.g., hips, spine) and/or appendicular (e.g., radius) exam was performed, as appropriate, using GE Secretary/administrator at [employer]. Images are obtained for bone mineral density measurement and are not obtained for diagnostic purposes. [id number] Exclusions: L1-L2.  COMPARISON:  None. FINDINGS: Scan quality: Good. LUMBAR SPINE (L3-L4): BMD (in g/cm2): 1.003 T-score: -1.6 Z-score: -0.7 LEFT FEMORAL NECK: BMD (in g/cm2): 1.014 T-score: -0.2 Z-score: 0.4 LEFT TOTAL HIP: BMD (in g/cm2): 0.954 T-score: -0.4 Z-score: -0.1 RIGHT FEMORAL NECK: BMD (in g/cm2): 0.988 T-score: -0.4 Z-score: 0.2 RIGHT TOTAL HIP: BMD (in g/cm2): 0.920 T-score: -0.7 Z-score: -0.4 FRAX 10-YEAR PROBABILITY OF FRACTURE: 10-year fracture risk is performed using the [employer] FRAX calculator based on patient-reported risk factors. Major osteoporotic fracture: 2.7% Hip fracture: 0.1% Other situations known to alter the reliability of the FRAX score should be considered when making treatment decisions, including chronic glucocorticoid use and past treatments. Further guidance on treatment can be found at the [facility] Osteoporosis Foundation's website [email or web address] IMPRESSION: Osteopenia based on BMD. Fracture risk is increased. Increased risk is based on low BMD. RECOMMENDATIONS: 1. All patients should optimize calcium  and vitamin D  intake. 2. Consider FDA-approved medical therapies in postmenopausal women and men aged 31 years and older, based on the following: - A hip or vertebral (clinical or morphometric) fracture - T-score less than or equal to -2.5 and secondary causes have been excluded. - Low bone mass (T-score between -1.0 and -2.5) and a 10-year probability of a hip fracture greater than or equal to 3% or a 10-year probability of a major osteoporosis-related fracture greater than or equal to 20% based on the [place] -adapted WHO algorithm. - Clinician judgment and/or patient preferences may indicate treatment for people with 10-year fracture probabilities above or below these levels 3. Patients with diagnosis of osteoporosis or at high risk for fracture should have regular bone mineral density tests. For patients eligible for Medicare, routine testing is allowed once every 2 years. The testing  frequency can be increased to one year for patients who have rapidly progressing disease, those who are receiving or discontinuing medical therapy to restore bone mass, or have additional risk factors. Electronically Signed   By: Harrietta Sherry M.D.   On: 01/18/2024 14:13

## 2024-01-20 ENCOUNTER — Other Ambulatory Visit: Payer: Self-pay | Admitting: Hematology and Oncology

## 2024-01-21 LAB — MULTIPLE MYELOMA PANEL, SERUM
Albumin SerPl Elph-Mcnc: 3.8 g/dL (ref 2.9–4.4)
Albumin/Glob SerPl: 1.1 (ref 0.7–1.7)
Alpha 1: 0.2 g/dL (ref 0.0–0.4)
Alpha2 Glob SerPl Elph-Mcnc: 0.8 g/dL (ref 0.4–1.0)
B-Globulin SerPl Elph-Mcnc: 1.1 g/dL (ref 0.7–1.3)
Gamma Glob SerPl Elph-Mcnc: 1.6 g/dL (ref 0.4–1.8)
Globulin, Total: 3.8 g/dL (ref 2.2–3.9)
IgA: 177 mg/dL (ref 87–352)
IgG (Immunoglobin G), Serum: 1800 mg/dL — ABNORMAL HIGH (ref 586–1602)
IgM (Immunoglobulin M), Srm: 66 mg/dL (ref 26–217)
Total Protein ELP: 7.6 g/dL (ref 6.0–8.5)

## 2024-01-22 ENCOUNTER — Other Ambulatory Visit (HOSPITAL_BASED_OUTPATIENT_CLINIC_OR_DEPARTMENT_OTHER): Payer: Self-pay

## 2024-01-22 ENCOUNTER — Encounter: Payer: Self-pay | Admitting: Hematology and Oncology

## 2024-01-22 ENCOUNTER — Ambulatory Visit: Payer: Self-pay

## 2024-01-22 ENCOUNTER — Ambulatory Visit (HOSPITAL_BASED_OUTPATIENT_CLINIC_OR_DEPARTMENT_OTHER)
Admission: EM | Admit: 2024-01-22 | Discharge: 2024-01-22 | Disposition: A | Discharge status: Home / Self Care | Attending: Family Medicine | Admitting: Family Medicine

## 2024-01-22 ENCOUNTER — Ambulatory Visit (HOSPITAL_BASED_OUTPATIENT_CLINIC_OR_DEPARTMENT_OTHER): Admitting: Radiology

## 2024-01-22 ENCOUNTER — Encounter (HOSPITAL_BASED_OUTPATIENT_CLINIC_OR_DEPARTMENT_OTHER): Payer: Self-pay

## 2024-01-22 DIAGNOSIS — W010XXA Fall on same level from slipping, tripping and stumbling without subsequent striking against object, initial encounter: Secondary | ICD-10-CM

## 2024-01-22 DIAGNOSIS — R2689 Other abnormalities of gait and mobility: Secondary | ICD-10-CM

## 2024-01-22 DIAGNOSIS — M25561 Pain in right knee: Secondary | ICD-10-CM

## 2024-01-22 MED ORDER — KETOROLAC TROMETHAMINE 30 MG/ML IJ SOLN
30.0000 mg | Freq: Once | INTRAMUSCULAR | Status: AC
  Administered 2024-01-22: 30 mg via INTRAMUSCULAR

## 2024-01-22 MED ORDER — KETOROLAC TROMETHAMINE 10 MG PO TABS
10.0000 mg | ORAL_TABLET | Freq: Four times a day (QID) | ORAL | 0 refills | Status: AC | PRN
  Filled 2024-01-22: qty 20, 5d supply, fill #0

## 2024-01-22 NOTE — Discharge Instructions (Addendum)
 Right knee pain and pain with weightbearing after a fall: X-rays appear negative.  Will adjust the plan of care, if needed once the x-rays have been read by the radiologist.  Knee brace applied.  Ketorolac  30 mg injection during the visit.  Ketorolac  10 mg every 6 hours with food if needed for knee pain.  Encouraged RICE therapy including ice, compression with a knee brace, elevation and rest.  If symptoms persist or do not improve needs to see family practice and may need physical therapy or referral to orthopedics.

## 2024-01-22 NOTE — ED Provider Notes (Signed)
 " [facility]    CSN: [id number] Arrival date & time: 01/22/24  9077      History   Chief Complaint Chief Complaint  Patient presents with   Knee Injury   Fall    HPI Donna Mayer is a 69 y.o. female.   69 year old female who was stepping up onto a porch on 01/21/2024 she slipped and she landed on concrete with her right knee hitting the concrete directly.  She has had right knee pain ever since the injury.  She has tried ice, elevation, a muscle relaxant and ibuprofen with poor relief.   Fall Pertinent negatives include no chest pain, no abdominal pain and no shortness of breath.    Past Medical History:  Diagnosis Date   Abnormal EKG 05/27/2023   Acute CHF (congestive heart failure) (HCC) 11/16/2019   Anemia 11/16/2019   At risk for loss of bone density 05/27/2023   Cardiac murmur    CHF (congestive heart failure) (HCC)    Chronic idiopathic constipation 07/12/2023   Chronic pain    Chronic pain of left knee 05/27/2023   Chronic systolic heart failure (HCC)    Class 3 severe obesity due to excess calories with serious comorbidity and body mass index (BMI) of 45.0 to 49.9 in adult (HCC) 06/13/2022   Depression, major, recurrent, mild 05/24/2019   Ectopic thyroid  tissue 10/05/2023   Eczema 02/10/2022   Elevated troponin 11/16/2019   Encounter for immunization 12/28/2022   Encounter for mammogram to establish baseline mammogram 02/20/2021   Encounter for osteoporosis screening in asymptomatic postmenopausal patient 12/28/2022   Essential hypertension, benign 05/13/2019   Foot pain, left 09/27/2022   GERD (gastroesophageal reflux disease)    Graves disease 05/24/2019   Graves disease 05/24/2019   Graves' disease with exophthalmos 10/06/2019   Hypertensive urgency 11/16/2019   Hyperthyroidism 05/13/2019   Hyperthyroidism 05/13/2019   IDA (iron deficiency anemia)    Impaired fasting glucose 12/28/2022   Influenza A 02/12/2023   Left thyroid  nodule  10/06/2019   Lumbar back pain 10/09/2021   LV dysfunction 07/02/2023   Mild vitamin D  deficiency 06/13/2022   Mitral regurgitation 07/02/2023   Mixed hyperlipidemia    Moderate recurrent major depression (HCC) 10/09/2021   Muscle spasm 02/10/2022   Need for influenza vaccination 10/09/2021   Non-intractable vomiting 05/24/2019   Obesity, Class III, BMI 40-49.9 (morbid obesity) (HCC) 06/06/2020   Other insomnia    Palpitations 05/19/2019   Plantar fasciitis of right foot 10/07/2022   Post-surgical hypothyroidism 01/16/2020   Prediabetes 02/20/2021   Primary insomnia    Severe episode of recurrent major depressive disorder, without psychotic features (HCC) 05/24/2019   Shortness of breath 05/27/2023   Thoracic aortic aneurysm without rupture 08/02/2020   Unexplained weight gain 12/28/2022   Vitamin D  insufficiency 05/27/2023   Weight loss 05/24/2019    Patient Active Problem List   Diagnosis Date Noted   H/O thyroidectomy 11/13/2023   Ectopic thyroid  tissue 10/05/2023   Anterior cervical lymphadenopathy 08/09/2023   Chronic idiopathic constipation 07/12/2023   LV dysfunction 07/02/2023   Mitral regurgitation 07/02/2023   CHF (congestive heart failure) (HCC)    Other insomnia    Abnormal EKG 05/27/2023   At risk for loss of bone density 05/27/2023   Chronic pain of left knee 05/27/2023   Vitamin D  insufficiency 05/27/2023   Shortness of breath 05/27/2023   Influenza A 02/12/2023   Impaired fasting glucose 12/28/2022   Unexplained weight gain 12/28/2022   Encounter  for osteoporosis screening in asymptomatic postmenopausal patient 12/28/2022   Encounter for immunization 12/28/2022   Plantar fasciitis of right foot 10/07/2022   Foot pain, left 09/27/2022   Class 3 severe obesity due to excess calories with serious comorbidity and body mass index (BMI) of 45.0 to 49.9 in adult (HCC) 06/13/2022   Mild vitamin D  deficiency 06/13/2022   Muscle spasm 02/10/2022   Eczema  02/10/2022   Lumbar back pain 10/09/2021   Moderate recurrent major depression (HCC) 10/09/2021   Need for influenza vaccination 10/09/2021   Encounter for mammogram to establish baseline mammogram 02/20/2021   Prediabetes 02/20/2021   Thoracic aortic aneurysm without rupture 08/02/2020   Obesity, Class III, BMI 40-49.9 (morbid obesity) (HCC) 06/06/2020   Post-surgical hypothyroidism 01/16/2020   Chronic systolic heart failure (HCC)    Acute CHF (congestive heart failure) (HCC) 11/16/2019   Hypertensive urgency 11/16/2019   Anemia 11/16/2019   Elevated troponin 11/16/2019   Primary insomnia    IDA (iron deficiency anemia)    GERD (gastroesophageal reflux disease)    Chronic pain    Cardiac murmur    Graves' disease with exophthalmos 10/06/2019   Left thyroid  nodule 10/06/2019   Graves disease 05/24/2019   Depression, major, recurrent, mild 05/24/2019   Non-intractable vomiting 05/24/2019   Weight loss 05/24/2019   Severe episode of recurrent major depressive disorder, without psychotic features (HCC) 05/24/2019   Palpitations 05/19/2019   Mixed hyperlipidemia    Essential hypertension, benign 05/13/2019   Hyperthyroidism 05/13/2019    Past Surgical History:  Procedure Laterality Date   BACK SURGERY     Dr Duwayne   CARPAL TUNNEL RELEASE Right    CESAREAN SECTION W/BTL     COLONOSCOPY  03/30/2015   Internal hoids. Mild diverticulosis. Otherwise normal colonocsopy to TI.   ESOPHAGOGASTRODUODENOSCOPY  03/30/2015   Mild gastritis. Small hiatal hernia   Lap band surgery  2010   [place]   SKIN GRAFT     TEE WITHOUT CARDIOVERSION N/A 11/21/2019   Procedure: TRANSESOPHAGEAL ECHOCARDIOGRAM (TEE);  Surgeon: Okey Vina GAILS, MD;  Location: [facility] ENDOSCOPY;  Service: Cardiovascular;  Laterality: N/A;   THYROIDECTOMY N/A 12/07/2019   Procedure: TOTAL THYROIDECTOMY;  Surgeon: Carlie Clark, MD;  Location: [facility] OR;  Service: ENT;  Laterality: N/A;    OB History   No obstetric history on  file.      Home Medications    Prior to Admission medications  Medication Sig Start Date End Date Taking? Authorizing Provider  ketorolac  (TORADOL ) 10 MG tablet Take 1 tablet (10 mg total) by mouth every 6 (six) hours as needed. 01/22/24  Yes Ival Domino, FNP  atorvastatin  (LIPITOR) 80 MG tablet TAKE 1 TABLET BY MOUTH DAILY AT  BEDTIME 11/16/23   Cox, Kirsten, MD  escitalopram  (LEXAPRO ) 10 MG tablet Take 1 tablet (10 mg total) by mouth daily. 10/21/23   CoxAbigail, MD  esomeprazole  (NEXIUM ) 40 MG capsule Take 1 capsule (40 mg total) by mouth daily at 12 noon. 07/14/23   CoxAbigail, MD  levothyroxine  (SYNTHROID ) 88 MCG tablet Take 1 tablet (88 mcg total) by mouth daily. 01/18/24   Cox, Abigail, MD  Polyethyl Glycol-Propyl Glycol (SYSTANE) 0.4-0.3 % SOLN Apply 1 drop to eye 4 (four) times daily as needed. In both eyes. 09/23/22   CoxAbigail, MD  valsartan -hydrochlorothiazide  (DIOVAN -HCT) 160-12.5 MG tablet Take 1 tablet by mouth daily. 08/25/23   [provider]  Vitamin D , Ergocalciferol , (DRISDOL ) 1.25 MG (50000 UNIT) CAPS capsule TAKE ONE CAPSULE  BY MOUTH weekly on monday, wednesday, and friday 07/14/23   CoxAbigail, MD  zolpidem  (AMBIEN  CR) 12.5 MG CR tablet TAKE 1 TABLET BY MOUTH AT BEDTIME AS NEEDED 08/31/23   CoxAbigail, MD    Family History Family History  Problem Relation Age of Onset   Diabetes Mother    Stroke Mother    Heart attack Father    Diabetes Sister    Sarcoidosis Sister    Colon cancer Neg Hx    Esophageal cancer Neg Hx    Rectal cancer Neg Hx    Stomach cancer Neg Hx    Breast cancer Neg Hx     Social History Social History[1]   Allergies   Patient has no known allergies.   Review of Systems Review of Systems  Constitutional:  Negative for chills and fever.  HENT:  Negative for ear pain and sore throat.   Eyes:  Negative for pain and visual disturbance.  Respiratory:  Negative for cough and shortness of breath.   Cardiovascular:  Negative  for chest pain and palpitations.  Gastrointestinal:  Negative for abdominal pain, constipation, diarrhea, nausea and vomiting.  Genitourinary:  Negative for dysuria and hematuria.  Musculoskeletal:  Positive for joint swelling (Right knee pain and swelling after a fall and injury.). Negative for arthralgias and back pain.  Skin:  Negative for color change and rash.  Neurological:  Negative for seizures and syncope.  All other systems reviewed and are negative.    Physical Exam Triage Vital Signs ED Triage Vitals  Encounter Vitals Group     BP 01/22/24 0936 (!) 187/83     Girls Systolic BP Percentile --      Girls Diastolic BP Percentile --      Boys Systolic BP Percentile --      Boys Diastolic BP Percentile --      Pulse Rate 01/22/24 0936 81     Resp 01/22/24 0936 20     Temp 01/22/24 0936 98.2 F (36.8 C)     Temp Source 01/22/24 0936 Oral     SpO2 01/22/24 0936 97 %     Weight --      Height --      Head Circumference --      Peak Flow --      Pain Score 01/22/24 0934 6     Pain Loc --      Pain Education --      Exclude from Growth Chart --    No data found.  Updated Vital Signs BP (!) 187/83 (BP Location: Right Arm)   Pulse 81   Temp 98.2 F (36.8 C) (Oral)   Resp 20   LMP 04/14/2010   SpO2 97%   Visual Acuity Right Eye Distance:   Left Eye Distance:   Bilateral Distance:    Right Eye Near:   Left Eye Near:    Bilateral Near:     Physical Exam Vitals and nursing note reviewed.  Constitutional:      General: She is not in acute distress.    Appearance: She is well-developed. She is not ill-appearing or toxic-appearing.  HENT:     Head: Normocephalic and atraumatic.     Right Ear: Hearing, tympanic membrane, ear canal and external ear normal.     Left Ear: Hearing, tympanic membrane, ear canal and external ear normal.     Nose: No congestion or rhinorrhea.     Right Sinus: No maxillary sinus tenderness or frontal sinus  tenderness.     Left Sinus:  No maxillary sinus tenderness or frontal sinus tenderness.     Mouth/Throat:     Lips: Pink.     Mouth: Mucous membranes are moist.     Pharynx: Uvula midline. No oropharyngeal exudate or posterior oropharyngeal erythema.     Tonsils: No tonsillar exudate.  Eyes:     Conjunctiva/sclera: Conjunctivae normal.     Pupils: Pupils are equal, round, and reactive to light.  Cardiovascular:     Rate and Rhythm: Normal rate and regular rhythm.     Pulses:          Dorsalis pedis pulses are 2+ on the right side and 2+ on the left side.       Posterior tibial pulses are 2+ on the right side and 2+ on the left side.     Heart sounds: S1 normal and S2 normal. No murmur heard. Pulmonary:     Effort: Pulmonary effort is normal. No respiratory distress.     Breath sounds: Normal breath sounds. No decreased breath sounds, wheezing, rhonchi or rales.  Abdominal:     General: Bowel sounds are normal.     Palpations: Abdomen is soft.     Tenderness: There is no abdominal tenderness.  Musculoskeletal:        General: No swelling.     Cervical back: Neck supple.     Right upper leg: Normal.     Left upper leg: Normal.     Right knee: Swelling and bony tenderness (Especially at and below the patella) present. No deformity, effusion, erythema, ecchymosis, lacerations or crepitus. Decreased range of motion (Decreased extension and flexion due to pain). Tenderness present over the medial joint line and patellar tendon. No LCL laxity, MCL laxity, ACL laxity or PCL laxity. Normal alignment, normal meniscus and normal patellar mobility. Normal pulse.     Left knee: Normal.     Right lower leg: Swelling present. 1+ Edema present.     Left lower leg: Swelling present. 1+ Edema present.     Right ankle: Swelling present. No deformity, ecchymosis or lacerations. No tenderness. Normal range of motion. Anterior drawer test negative. Normal pulse.     Right Achilles Tendon: Normal.     Left ankle: Swelling present. No  deformity, ecchymosis or lacerations. No tenderness. Normal range of motion. Anterior drawer test negative. Normal pulse.     Left Achilles Tendon: Normal.     Right foot: Normal.     Left foot: Normal.     Comments: Patient has swelling and tenderness at the right knee but has mild to moderate swelling of both lower legs and ankles.  This is chronic edema or swelling.  Lymphadenopathy:     Head:     Right side of head: No submental, submandibular, tonsillar, preauricular or posterior auricular adenopathy.     Left side of head: No submental, submandibular, tonsillar, preauricular or posterior auricular adenopathy.     Cervical: No cervical adenopathy.     Right cervical: No superficial cervical adenopathy.    Left cervical: No superficial cervical adenopathy.  Skin:    General: Skin is warm and dry.     Capillary Refill: Capillary refill takes less than 2 seconds.     Findings: No rash.  Neurological:     Mental Status: She is alert and oriented to person, place, and time.  Psychiatric:        Mood and Affect: Mood normal.  UC Treatments / Results  Labs (all labs ordered are listed, but only abnormal results are displayed) CMP ([facility] only): 01/19/24:    Component Ref Range & Units (hover) 3 d ago (01/19/24)  Sodium 138  Potassium 3.7  Chloride 101  CO2 27  Glucose, Bld 86  Comment: Glucose reference range applies only to samples taken after fasting for at least 8 hours.  BUN 22  Creatinine 0.71  Calcium  9.8  Total Protein 7.8  Albumin 4.3  AST 30  ALT 18  Alkaline Phosphatase 102  Total Bilirubin 0.5  GFR, Estimated >60  Comment: (NOTE) Calculated using the CKD-EPI Creatinine Equation (2021)  Anion gap 11  Comment: Performed at [facility], [address], [place], [place], [address]  Resulting Agency [facility]      EKG   Radiology No results found.  Procedures Procedures (including critical care  time)  Medications Ordered in UC Medications  ketorolac  (TORADOL ) 30 MG/ML injection 30 mg (30 mg Intramuscular Given 01/22/24 1147)    Initial Impression / Assessment and Plan / UC Course  I have reviewed the triage vital signs and the nursing notes.  Pertinent labs & imaging results that were available during my care of the patient were reviewed by me and considered in my medical decision making (see chart for details).  Plan of Care (see discharge instructions for additional patient precautions and education):    I reviewed the plan of care with the patient and/or the patient's guardian.  The patient and/or guardian had time to ask questions and acknowledged that the questions were answered.  Final Clinical Impressions(s) / UC Diagnoses   Final diagnoses:  Acute pain of right knee  Antalgic gait  Fall on same level from slipping, tripping or stumbling, initial encounter     Discharge Instructions      Right knee pain and pain with weightbearing after a fall: X-rays appear negative.  Will adjust the plan of care, if needed once the x-rays have been read by the radiologist.  Knee brace applied.  Ketorolac  30 mg injection during the visit.  Ketorolac  10 mg every 6 hours with food if needed for knee pain.  Encouraged RICE therapy including ice, compression with a knee brace, elevation and rest.  If symptoms persist or do not improve needs to see family practice and may need physical therapy or referral to orthopedics.     ED Prescriptions     Medication Sig Dispense Auth. Provider   ketorolac  (TORADOL ) 10 MG tablet Take 1 tablet (10 mg total) by mouth every 6 (six) hours as needed. 20 tablet Saloni Lablanc, FNP      PDMP not reviewed this encounter.    [1]  Social History Tobacco Use   Smoking status: Never   Smokeless tobacco: Never  Vaping Use   Vaping status: Never Used  Substance Use Topics   Alcohol use: Never   Drug use: Never     Ival Domino,  FNP 01/22/24 1201  "

## 2024-01-22 NOTE — ED Triage Notes (Signed)
 Pt states yesterday she was stepping up onto her porch when she missed the step and landed directly on her right knee on the concrete. Since the fall she has had a dull pain in her knee that radiates up to her hip and down to her ankle. She has take ibuprofen this morning and a muscle relaxer last night with no relief.

## 2024-01-22 NOTE — Telephone Encounter (Signed)
 FYI Only or Action Required?: FYI only for provider: patient going to urgent care.  Patient was last seen in primary care on 01/11/2024 by Sherre Clapper, MD.  Called Nurse Triage reporting Knee Injury.  Symptoms began yesterday.  Interventions attempted: OTC medications: ibuprofen.  Symptoms are: stable.  Triage Disposition: See Physician Within 24 Hours  Patient/caregiver understands and will follow disposition?: Yes            Copied from CRM [id number]. Topic: Clinical - Red Word Triage >> Jan 22, 2024  8:24 AM Willma SAUNDERS wrote: Red Word that prompted transfer to Nurse Triage: Patient fell on her right leg yesterday and she is having a lot of pain and difficulty walking. Reason for Disposition  [1] MODERATE pain (e.g., interferes with normal activities, limping) AND [2] high-risk adult (e.g., age > 60 years, osteoporosis, chronic steroid use)  Answer Assessment - Initial Assessment Questions 1. MECHANISM: How did the injury happen? (e.g., twisting injury, direct blow)      Stepped up and didn't have my leg all the way stepping on the porch, my knee went down  2. ONSET: When did the injury happen? 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  3. LOCATION: Where is the injury located?      Right knee.  4. APPEARANCE of INJURY: What does the injury look like?  (e.g., deformity of leg)     Painful when walking feels like it is going to the inside. Asked if any deformities or if it looks dislocated. Patient states her foot doesn't look turned in but her knee feels off when walking on it. RN attempted to clarify with patient if crooked/deformed and she is unable to clearly answer or assess/describe.   5. SEVERITY: Can you put weight on that leg? Can you walk?      Yes, but painful.  6. SIZE: For cuts, bruises, or swelling, ask: How large is it? (e.g., inches or centimeters)      No cuts, bruising or swelling.  7. PAIN: Is there pain? If Yes, ask: How  bad is the pain?   What does it keep you from doing? (Scale 0-10; or none, mild, moderate, severe)     7/10, radiates up to right thigh.  8. TETANUS: For any breaks in the skin, ask: When was your last tetanus booster?     N/A  9. OTHER SYMPTOMS: Do you have any other symptoms?      No.  Protocols used: Leg Injury-A-AH

## 2024-01-23 ENCOUNTER — Ambulatory Visit (HOSPITAL_BASED_OUTPATIENT_CLINIC_OR_DEPARTMENT_OTHER): Payer: Self-pay | Admitting: Family Medicine

## 2024-01-23 NOTE — Progress Notes (Signed)
 Radiology review of knee x-rays shows some degenerative joint disease and effusion or fluid on the joint but otherwise the x-rays are negative.  No fractures nor dislocations.  Patient updated.

## 2024-02-01 ENCOUNTER — Other Ambulatory Visit: Payer: Self-pay | Admitting: Hematology and Oncology

## 2024-02-01 DIAGNOSIS — D509 Iron deficiency anemia, unspecified: Secondary | ICD-10-CM

## 2024-02-02 ENCOUNTER — Other Ambulatory Visit: Payer: Self-pay

## 2024-02-02 ENCOUNTER — Telehealth: Payer: Self-pay

## 2024-02-02 ENCOUNTER — Inpatient Hospital Stay (HOSPITAL_BASED_OUTPATIENT_CLINIC_OR_DEPARTMENT_OTHER): Admitting: Hematology and Oncology

## 2024-02-02 VITALS — BP 149/82 | HR 69 | Temp 97.9°F | Resp 20 | Ht 62.0 in | Wt 272.5 lb

## 2024-02-02 DIAGNOSIS — E559 Vitamin D deficiency, unspecified: Secondary | ICD-10-CM

## 2024-02-02 DIAGNOSIS — D509 Iron deficiency anemia, unspecified: Secondary | ICD-10-CM | POA: Diagnosis not present

## 2024-02-02 MED ORDER — VITAMIN D (ERGOCALCIFEROL) 1.25 MG (50000 UNIT) PO CAPS: ORAL_CAPSULE | ORAL | 3 refills | Status: AC

## 2024-02-02 NOTE — Telephone Encounter (Signed)
 Done

## 2024-02-02 NOTE — Progress Notes (Signed)
 [facility] CONSULT NOTE  Patient Care Team: Sherre Clapper, MD as PCP - General (Family Medicine) Monetta Redell PARAS, MD as PCP - Cardiology (Cardiology) Hyacinth Warren PARAS, FNP (Endocrinology) Melanee Rosalynn Decent, MD (Inactive) as Referring Physician (Endocrinology) Carlie Clark, MD as Consulting Physician (Otolaryngology) Nyle Rankin POUR, [facility] (Inactive) (Pharmacist)  ASSESSMENT & PLAN:  Anemia: Increasing symptoms of iron deficiency including fatigue, shortness of breath and feeling cold. CBC today reveals hemoglobin 10.6. Iron studies are pending. We discussed potential treatment options including oral iron versus iron infusions. We will plan for IV iron with Feraheme and repeat evaluation in 4 weeks.   All questions were answered. The patient knows to call the clinic with any problems, questions or concerns.  The total time spent in the appointment was 20 minutes encounter with patients including review of chart and various tests results, discussions about plan of care and coordination of care plan  Eleanor DELENA Bach, NP 1/20/20267:50 AM   CHIEF COMPLAINTS/PURPOSE OF CONSULTATION:  Anemia  HISTORY OF PRESENTING ILLNESS:  Donna Mayer 69 y.o. female is here because of anemia  She was found to have abnormal CBC from 01/19/2024 She denies recent chest pain on exertion, pre-syncopal episodes, or palpitations. Complains of shortness of breath and fatigue.  She had not noticed any recent bleeding such as epistaxis, hematuria or hematochezia The patient denies over the counter NSAID ingestion. She is not  on antiplatelets agents. Her last colonoscopy was 10 years ago and was benign. She had no prior history or diagnosis of cancer. Her age appropriate screening programs are up-to-date. She denies any pica and eats a variety of diet. She never donated blood or received blood transfusion The patient was prescribed oral iron supplements and she takes ferrous sulfate  325  mg.  MEDICAL HISTORY:  Past Medical History:  Diagnosis Date   Abnormal EKG 05/27/2023   Acute CHF (congestive heart failure) (HCC) 11/16/2019   Anemia 11/16/2019   At risk for loss of bone density 05/27/2023   Cardiac murmur    CHF (congestive heart failure) (HCC)    Chronic idiopathic constipation 07/12/2023   Chronic pain    Chronic pain of left knee 05/27/2023   Chronic systolic heart failure (HCC)    Class 3 severe obesity due to excess calories with serious comorbidity and body mass index (BMI) of 45.0 to 49.9 in adult (HCC) 06/13/2022   Depression, major, recurrent, mild 05/24/2019   Ectopic thyroid  tissue 10/05/2023   Eczema 02/10/2022   Elevated troponin 11/16/2019   Encounter for immunization 12/28/2022   Encounter for mammogram to establish baseline mammogram 02/20/2021   Encounter for osteoporosis screening in asymptomatic postmenopausal patient 12/28/2022   Essential hypertension, benign 05/13/2019   Foot pain, left 09/27/2022   GERD (gastroesophageal reflux disease)    Graves disease 05/24/2019   Graves disease 05/24/2019   Graves' disease with exophthalmos 10/06/2019   Hypertensive urgency 11/16/2019   Hyperthyroidism 05/13/2019   Hyperthyroidism 05/13/2019   IDA (iron deficiency anemia)    Impaired fasting glucose 12/28/2022   Influenza A 02/12/2023   Left thyroid  nodule 10/06/2019   Lumbar back pain 10/09/2021   LV dysfunction 07/02/2023   Mild vitamin D  deficiency 06/13/2022   Mitral regurgitation 07/02/2023   Mixed hyperlipidemia    Moderate recurrent major depression (HCC) 10/09/2021   Muscle spasm 02/10/2022   Need for influenza vaccination 10/09/2021   Non-intractable vomiting 05/24/2019   Obesity, Class III, BMI 40-49.9 (morbid obesity) (HCC) 06/06/2020   Other  insomnia    Palpitations 05/19/2019   Plantar fasciitis of right foot 10/07/2022   Post-surgical hypothyroidism 01/16/2020   Prediabetes 02/20/2021   Primary insomnia    Severe episode  of recurrent major depressive disorder, without psychotic features (HCC) 05/24/2019   Shortness of breath 05/27/2023   Thoracic aortic aneurysm without rupture 08/02/2020   Unexplained weight gain 12/28/2022   Vitamin D  insufficiency 05/27/2023   Weight loss 05/24/2019    SURGICAL HISTORY: Past Surgical History:  Procedure Laterality Date   BACK SURGERY     Dr Duwayne   CARPAL TUNNEL RELEASE Right    CESAREAN SECTION W/BTL     COLONOSCOPY  03/30/2015   Internal hoids. Mild diverticulosis. Otherwise normal colonocsopy to TI.   ESOPHAGOGASTRODUODENOSCOPY  03/30/2015   Mild gastritis. Small hiatal hernia   Lap band surgery  2010   [place]   SKIN GRAFT     TEE WITHOUT CARDIOVERSION N/A 11/21/2019   Procedure: TRANSESOPHAGEAL ECHOCARDIOGRAM (TEE);  Surgeon: Okey Vina GAILS, MD;  Location: [facility] ENDOSCOPY;  Service: Cardiovascular;  Laterality: N/A;   THYROIDECTOMY N/A 12/07/2019   Procedure: TOTAL THYROIDECTOMY;  Surgeon: Carlie Clark, MD;  Location[facility] OR;  Service: ENT;  Laterality: N/A;    SOCIAL HISTORY: Social History   Socioeconomic History   Marital status: Divorced    Spouse name: Not on file   Number of children: 2   Years of education: Not on file   Highest education level: Not on file  Occupational History   Occupation: disabled  Tobacco Use   Smoking status: Never   Smokeless tobacco: Never  Vaping Use   Vaping status: Never Used  Substance and Sexual Activity   Alcohol use: Never   Drug use: Never   Sexual activity: Not Currently  Other Topics Concern   Not on file  Social History Narrative   Not on file   Social Drivers of Health   Tobacco Use: Low Risk (01/22/2024)   Patient History    Smoking Tobacco Use: Never    Smokeless Tobacco Use: Never    Passive Exposure: Not on file  Financial Resource Strain: Low Risk (12/25/2022)   Overall Financial Resource Strain (CARDIA)    Difficulty of Paying Living Expenses: Not hard at all  Food Insecurity: Low  Risk (12/17/2023)   Received from [facility]   Epic    Within the past 12 months, you worried that your food would run out before you got money to buy more: Never true    Within the past 12 months, the food you bought just didn't last and you didn't have money to get more. : Never true  Transportation Needs: No Transportation Needs (12/17/2023)   Received from [facility]    In the past 12 months, has lack of reliable transportation kept you from medical appointments, meetings, work or from getting things needed for daily living? : No  Physical Activity: Insufficiently Active (02/05/2023)   Exercise Vital Sign    Days of Exercise per Week: 7 days    Minutes of Exercise per Session: 20 min  Stress: No Stress Concern Present (12/25/2022)   [employer] of Occupational Health - Occupational Stress Questionnaire    Feeling of Stress : Not at all  Social Connections: Moderately Isolated (12/25/2022)   Social Connection and Isolation Panel    Frequency of Communication with Friends and Family: More than three times a week    Frequency of Social Gatherings with Friends and Family: More  than three times a week    Attends Religious Services: More than 4 times per year    Active Member of Clubs or Organizations: No    Attends Banker Meetings: Never    Marital Status: Divorced  Catering Manager Violence: Not At Risk (07/07/2023)   Epic    Fear of Current or Ex-Partner: No    Emotionally Abused: No    Physically Abused: No    Sexually Abused: No  Depression (PHQ2-9): Medium Risk (01/11/2024)   Depression (PHQ2-9)    PHQ-2 Score: 9  Alcohol Screen: Low Risk (07/18/2021)   Alcohol Screen    Last Alcohol Screening Score (AUDIT): 0  Housing: Medium Risk (12/17/2023)   Received from [facility]   Epic    What is your living situation today?: I have a place to live today, but I am worried about losing it in the future    Think about the place you live. Do you  have problems with any of the following? Choose all that apply:: None/None on this list  Utilities: Low Risk (12/17/2023)   Received from [facility]   Utilities    In the past 12 months has the electric, gas, oil, or water  company threatened to shut off services in your home? : No  Health Literacy: Adequate Health Literacy (12/25/2022)   B1300 Health Literacy    Frequency of need for help with medical instructions: Never    FAMILY HISTORY: Family History  Problem Relation Age of Onset   Diabetes Mother    Stroke Mother    Heart attack Father    Diabetes Sister    Sarcoidosis Sister    Colon cancer Neg Hx    Esophageal cancer Neg Hx    Rectal cancer Neg Hx    Stomach cancer Neg Hx    Breast cancer Neg Hx     ALLERGIES:  has no known allergies.  MEDICATIONS:  Current Outpatient Medications  Medication Sig Dispense Refill   atorvastatin  (LIPITOR) 80 MG tablet TAKE 1 TABLET BY MOUTH DAILY AT  BEDTIME 100 tablet 2   escitalopram  (LEXAPRO ) 10 MG tablet Take 1 tablet (10 mg total) by mouth daily. 90 tablet 1   esomeprazole  (NEXIUM ) 40 MG capsule Take 1 capsule (40 mg total) by mouth daily at 12 noon. 90 capsule 3   ketorolac  (TORADOL ) 10 MG tablet Take 1 tablet (10 mg total) by mouth every 6 (six) hours as needed. 20 tablet 0   levothyroxine  (SYNTHROID ) 88 MCG tablet Take 1 tablet (88 mcg total) by mouth daily. 90 tablet 3   Polyethyl Glycol-Propyl Glycol (SYSTANE) 0.4-0.3 % SOLN Apply 1 drop to eye 4 (four) times daily as needed. In both eyes. 30 mL 3   valsartan -hydrochlorothiazide  (DIOVAN -HCT) 160-12.5 MG tablet Take 1 tablet by mouth daily.     Vitamin D , Ergocalciferol , (DRISDOL ) 1.25 MG (50000 UNIT) CAPS capsule TAKE ONE CAPSULE BY MOUTH weekly on monday, wednesday, and friday 39 capsule 3   zolpidem  (AMBIEN  CR) 12.5 MG CR tablet TAKE 1 TABLET BY MOUTH AT BEDTIME AS NEEDED 30 tablet 5   No current facility-administered medications for this visit.    REVIEW OF SYSTEMS:    Constitutional: Denies fevers, chills or abnormal night sweats Eyes: Denies blurriness of vision, double vision or watery eyes Ears, nose, mouth, throat, and face: Denies mucositis or sore throat Respiratory: Denies cough, dyspnea or wheezes Cardiovascular: Denies palpitation, chest discomfort or lower extremity swelling Gastrointestinal:  Denies nausea, heartburn or change  in bowel habits Skin: Denies abnormal skin rashes Lymphatics: Denies new lymphadenopathy or easy bruising Neurological:Denies numbness, tingling or new weaknesses Behavioral/Psych: Mood is stable, no new changes  All other systems were reviewed with the patient and are negative.  PHYSICAL EXAMINATION: ECOG PERFORMANCE STATUS: 1 - Symptomatic but completely ambulatory  There were no vitals filed for this visit. There were no vitals filed for this visit.  GENERAL:alert, no distress and comfortable SKIN: skin color, texture, turgor are normal, no rashes or significant lesions EYES: normal, conjunctiva are pink and non-injected, sclera clear OROPHARYNX:no exudate, no erythema and lips, buccal mucosa, and tongue normal  NECK: supple, thyroid  normal size, non-tender, without nodularity LYMPH:  no palpable lymphadenopathy in the cervical, axillary or inguinal LUNGS: clear to auscultation and percussion with normal breathing effort HEART: regular rate & rhythm and no murmurs and no lower extremity edema ABDOMEN:abdomen soft, non-tender and normal bowel sounds Musculoskeletal:no cyanosis of digits and no clubbing  PSYCH: alert & oriented x 3 with fluent speech NEURO: no focal motor/sensory deficits  RADIOGRAPHIC STUDIES: I have personally reviewed the radiological images as listed and agreed with the findings in the report. DG Knee AP/LAT W/Sunrise Right Result Date: 01/22/2024 CLINICAL DATA:  Right knee pain after fall yesterday EXAM: RIGHT KNEE 3 VIEWS COMPARISON:  None Available. FINDINGS: No fracture or dislocation  is noted. Moderate size suprapatellar joint effusion is noted. Minimal patellar spurring is noted. Mild spurring is noted medially and laterally with mild medial joint space narrowing. Chondrocalcinosis is noted laterally IMPRESSION: Tricompartmental degenerative joint disease. Moderate size suprapatellar joint effusion. No fracture or dislocation. Electronically Signed   By: Lynwood Landy Raddle M.D.   On: 01/22/2024 12:11   DG BONE DENSITY (DXA) Result Date: 01/18/2024 EXAM: DUAL X-RAY ABSORPTIOMETRY (DXA) FOR BONE MINERAL DENSITY 01/15/2024 3:17 pm CLINICAL DATA:  69 year old Female Postmenopausal. Osteoporosis screening TECHNIQUE: An axial (e.g., hips, spine) and/or appendicular (e.g., radius) exam was performed, as appropriate, using GE Secretary/administrator at [employer]. Images are obtained for bone mineral density measurement and are not obtained for diagnostic purposes. [id number] Exclusions: L1-L2. COMPARISON:  None. FINDINGS: Scan quality: Good. LUMBAR SPINE (L3-L4): BMD (in g/cm2): 1.003 T-score: -1.6 Z-score: -0.7 LEFT FEMORAL NECK: BMD (in g/cm2): 1.014 T-score: -0.2 Z-score: 0.4 LEFT TOTAL HIP: BMD (in g/cm2): 0.954 T-score: -0.4 Z-score: -0.1 RIGHT FEMORAL NECK: BMD (in g/cm2): 0.988 T-score: -0.4 Z-score: 0.2 RIGHT TOTAL HIP: BMD (in g/cm2): 0.920 T-score: -0.7 Z-score: -0.4 FRAX 10-YEAR PROBABILITY OF FRACTURE: 10-year fracture risk is performed using the [employer] FRAX calculator based on patient-reported risk factors. Major osteoporotic fracture: 2.7% Hip fracture: 0.1% Other situations known to alter the reliability of the FRAX score should be considered when making treatment decisions, including chronic glucocorticoid use and past treatments. Further guidance on treatment can be found at the [facility] Osteoporosis Foundation's website [email or web address]://www.[email or web address].com/. IMPRESSION: Osteopenia based on BMD. Fracture risk is increased. Increased risk is based on low BMD. RECOMMENDATIONS: 1.  All patients should optimize calcium  and vitamin D  intake. 2. Consider FDA-approved medical therapies in postmenopausal women and men aged 49 years and older, based on the following: - A hip or vertebral (clinical or morphometric) fracture - T-score less than or equal to -2.5 and secondary causes have been excluded. - Low bone mass (T-score between -1.0 and -2.5) and a 10-year probability of a hip fracture greater than or equal to 3% or a 10-year probability of a major osteoporosis-related fracture greater than or equal  to 20% based on the [place] -adapted WHO algorithm. - Clinician judgment and/or patient preferences may indicate treatment for people with 10-year fracture probabilities above or below these levels 3. Patients with diagnosis of osteoporosis or at high risk for fracture should have regular bone mineral density tests. For patients eligible for Medicare, routine testing is allowed once every 2 years. The testing frequency can be increased to one year for patients who have rapidly progressing disease, those who are receiving or discontinuing medical therapy to restore bone mass, or have additional risk factors. Electronically Signed   By: Harrietta Sherry M.D.   On: 01/18/2024 14:13

## 2024-02-02 NOTE — Telephone Encounter (Signed)
 Patient is requesting a refill on her Vitamin D  be sent to [facility], I advised her while scheduling her AWV I would send a message to clinical staff.

## 2024-02-03 ENCOUNTER — Other Ambulatory Visit: Payer: Self-pay | Admitting: Pharmacist

## 2024-02-04 ENCOUNTER — Inpatient Hospital Stay

## 2024-02-04 ENCOUNTER — Ambulatory Visit

## 2024-02-04 VITALS — BP 147/75 | HR 84 | Temp 98.4°F | Resp 16

## 2024-02-04 DIAGNOSIS — D509 Iron deficiency anemia, unspecified: Secondary | ICD-10-CM

## 2024-02-04 MED ORDER — SODIUM CHLORIDE 0.9 % IV SOLN
510.0000 mg | Freq: Once | INTRAVENOUS | Status: AC
  Administered 2024-02-04: 510 mg via INTRAVENOUS
  Filled 2024-02-04: qty 510

## 2024-02-04 MED ORDER — SODIUM CHLORIDE 0.9 % IV SOLN: INTRAVENOUS | Status: DC

## 2024-02-04 MED ORDER — ACETAMINOPHEN 325 MG PO TABS
650.0000 mg | ORAL_TABLET | Freq: Once | ORAL | Status: AC
  Administered 2024-02-04: 650 mg via ORAL
  Filled 2024-02-04: qty 2

## 2024-02-04 MED ORDER — LORATADINE 10 MG PO TABS
10.0000 mg | ORAL_TABLET | Freq: Once | ORAL | Status: AC
  Administered 2024-02-04: 10 mg via ORAL
  Filled 2024-02-04: qty 1

## 2024-02-04 NOTE — Patient Instructions (Signed)

## 2024-02-09 ENCOUNTER — Ambulatory Visit

## 2024-02-10 ENCOUNTER — Inpatient Hospital Stay

## 2024-02-10 VITALS — BP 167/102 | HR 90 | Temp 98.4°F | Resp 18

## 2024-02-10 DIAGNOSIS — D509 Iron deficiency anemia, unspecified: Secondary | ICD-10-CM

## 2024-02-10 MED ORDER — LORATADINE 10 MG PO TABS
10.0000 mg | ORAL_TABLET | Freq: Once | ORAL | Status: AC
  Administered 2024-02-10: 10 mg via ORAL
  Filled 2024-02-10: qty 1

## 2024-02-10 MED ORDER — SODIUM CHLORIDE 0.9 % IV SOLN
510.0000 mg | Freq: Once | INTRAVENOUS | Status: AC
  Administered 2024-02-10: 510 mg via INTRAVENOUS
  Filled 2024-02-10: qty 510

## 2024-02-10 MED ORDER — ACETAMINOPHEN 325 MG PO TABS
650.0000 mg | ORAL_TABLET | Freq: Once | ORAL | Status: AC
  Administered 2024-02-10: 650 mg via ORAL
  Filled 2024-02-10: qty 2

## 2024-02-10 MED ORDER — SODIUM CHLORIDE 0.9 % IV SOLN: INTRAVENOUS | Status: DC

## 2024-02-10 NOTE — Progress Notes (Signed)
 Patient reports that she has not taken her antihypertensive meds today. Agrees to take as soon as possible and follow up with primary

## 2024-02-16 ENCOUNTER — Encounter: Admitting: Dietician

## 2024-03-01 ENCOUNTER — Inpatient Hospital Stay

## 2024-03-01 ENCOUNTER — Inpatient Hospital Stay: Admitting: Hematology and Oncology

## 2024-03-08 ENCOUNTER — Ambulatory Visit (HOSPITAL_COMMUNITY): Payer: Self-pay | Admitting: Licensed Clinical Social Worker

## 2024-04-12 ENCOUNTER — Ambulatory Visit: Admitting: Family Medicine
